# Patient Record
Sex: Female | Born: 1985 | Race: Black or African American | Hispanic: No | Marital: Single | Smoking: Never smoker
Health system: Southern US, Community
[De-identification: ages and names within clinical notes are randomized; demographics above are authoritative.]

## PROBLEM LIST (undated history)

## (undated) HISTORY — PX: DILATION AND CURETTAGE OF UTERUS: SHX78

---

## 2011-09-13 DIAGNOSIS — N750 Cyst of Bartholin's gland: Secondary | ICD-10-CM | POA: Insufficient documentation

## 2011-09-13 DIAGNOSIS — N751 Abscess of Bartholin's gland: Secondary | ICD-10-CM

## 2011-09-13 HISTORY — DX: Abscess of Bartholin's gland: N75.1

## 2011-09-13 HISTORY — DX: Cyst of Bartholin's gland: N75.0

## 2019-02-03 ENCOUNTER — Encounter: Payer: Self-pay | Admitting: Obstetrics & Gynecology

## 2019-02-03 ENCOUNTER — Ambulatory Visit (INDEPENDENT_AMBULATORY_CARE_PROVIDER_SITE_OTHER): Payer: BLUE CROSS/BLUE SHIELD | Admitting: Obstetrics & Gynecology

## 2019-02-03 ENCOUNTER — Other Ambulatory Visit: Payer: Self-pay

## 2019-02-03 DIAGNOSIS — N751 Abscess of Bartholin's gland: Secondary | ICD-10-CM | POA: Diagnosis not present

## 2019-02-03 NOTE — Progress Notes (Signed)
   TELEHEALTH VIRTUAL GYNECOLOGY VISIT ENCOUNTER NOTE  I connected with [name] on 02/03/19 at  2:15 PM EDT by telephone at home and verified that I am speaking with the correct person using two identifiers.   I discussed the limitations, risks, security and privacy concerns of performing an evaluation and management service by telephone and the availability of in person appointments. I also discussed with the patient that there may be a patient responsible charge related to this service. The patient expressed understanding and agreed to proceed.   History:  Sally Wiley is a 33 y.o.  female being evaluated today for Bartholin's gland cyst. Sx has been present for 2-3 days. The pain is mild.   She does report a h/o having a Barthloins gland abscess prev that was drained. She reports that the pain is nowhere near as bad as what she had prev. She denies any abnormal vaginal discharge, bleeding, pelvic pain or other concerns.    The following portions of the patient's history were reviewed and updated as appropriate: allergies, current medications, past family history, past medical history, past social history, past surgical history and problem list.    Review of Systems:  Pertinent items noted in HPI and remainder of comprehensive ROS otherwise negative.  Physical Exam:   General:  Alert, oriented and cooperative.   Mental Status: Normal mood and affect perceived. Normal judgment and thought content.  Physical exam deferred due to nature of the encounter   Assessment and Plan:     Barthloins gland abscess- not draining.   rec warm compress to affected area qid until draining.   Pt to call or go to the hosp if that pain becomes unbearable or if she develops fever or chills.      I discussed the assessment and treatment plan with the patient. The patient was provided an opportunity to ask questions and all were answered. The patient agreed with the plan and demonstrated an  understanding of the instructions.   The patient was advised to call back or seek an in-person evaluation/go to the ED if the symptoms worsen or if the condition fails to improve as anticipated.  I provided 10 minutes of non-face-to-face time during this encounter.   Willodean Rosenthal, MD [facility] [employer], [facility]

## 2019-02-03 NOTE — Progress Notes (Signed)
Patient states she currently has a Bartholins glad cysts- "size of walnut" Patient also states she has cyst on her ovary. Armandina Stammer RN

## 2019-04-27 ENCOUNTER — Other Ambulatory Visit: Payer: Self-pay

## 2019-04-27 ENCOUNTER — Ambulatory Visit (INDEPENDENT_AMBULATORY_CARE_PROVIDER_SITE_OTHER): Payer: BLUE CROSS/BLUE SHIELD | Admitting: Advanced Practice Midwife

## 2019-04-27 ENCOUNTER — Encounter: Payer: Self-pay | Admitting: Advanced Practice Midwife

## 2019-04-27 DIAGNOSIS — Z113 Encounter for screening for infections with a predominantly sexual mode of transmission: Secondary | ICD-10-CM

## 2019-04-27 DIAGNOSIS — Z3402 Encounter for supervision of normal first pregnancy, second trimester: Secondary | ICD-10-CM

## 2019-04-27 DIAGNOSIS — Z124 Encounter for screening for malignant neoplasm of cervix: Secondary | ICD-10-CM | POA: Diagnosis not present

## 2019-04-27 DIAGNOSIS — Z3400 Encounter for supervision of normal first pregnancy, unspecified trimester: Secondary | ICD-10-CM

## 2019-04-27 DIAGNOSIS — Z1151 Encounter for screening for human papillomavirus (HPV): Secondary | ICD-10-CM

## 2019-04-27 DIAGNOSIS — Z3A14 14 weeks gestation of pregnancy: Secondary | ICD-10-CM | POA: Diagnosis not present

## 2019-04-27 LAB — POCT URINALYSIS DIPSTICK OB
Glucose, UA: NEGATIVE
Leukocytes, UA: NEGATIVE
Nitrite, UA: NEGATIVE
Spec Grav, UA: 1.015 (ref 1.010–1.025)
pH, UA: 5 (ref 5.0–8.0)

## 2019-04-27 MED ORDER — AMBULATORY NON FORMULARY MEDICATION: 1.0000 | 0 refills | Status: DC

## 2019-04-27 NOTE — Progress Notes (Signed)
  Subjective:    Sally Wiley is a G1P0 [redacted]w[redacted]d being seen today for her first obstetrical visit.  Her obstetrical history is significant for primigravida. Patient does intend to breast feed. Pregnancy history fully reviewed.  Patient reports no complaints.  Vitals:   04/27/19 0951 04/27/19 0953  BP: (!) 107/58   Pulse: 89   Weight: 49.5 kg   Height:  5\' 2"  (1.575 m)    HISTORY: OB History  Gravida Para Term Preterm AB Living  1            SAB TAB Ectopic Multiple Live Births               # Outcome Date GA Lbr Len/2nd Weight Sex Delivery Anes PTL Lv  1 Current            No past medical history on file. History reviewed. No pertinent surgical history. Family History  Problem Relation Age of Onset  . Cancer Paternal Grandmother   . Heart disease Mother   . Diabetes Neg Hx   . Hypertension Neg Hx      Exam    Uterus:     Pelvic Exam:    Perineum: No Hemorrhoids, Normal Perineum   Vulva: Bartholin's, Urethra, Skene's normal   Vagina:  normal mucosa, normal discharge   pH:    Cervix: no bleeding following Pap and no cervical motion tenderness   Adnexa: normal adnexa and no mass, fullness, tenderness   Bony Pelvis: gynecoid  System: Breast:  normal appearance, no masses or tenderness   Skin: normal coloration and turgor, no rashes    Neurologic: oriented, grossly non-focal   Extremities: normal strength, tone, and muscle mass   HEENT neck supple with midline trachea   Mouth/Teeth mucous membranes moist, pharynx normal without lesions   Neck supple and no masses   Cardiovascular: regular rate and rhythm, no murmurs or gallops   Respiratory:  appears well, vitals normal, no respiratory distress, acyanotic, normal RR, ear and throat exam is normal, neck free of mass or lymphadenopathy, chest clear, no wheezing, crepitations, rhonchi, normal symmetric air entry   Abdomen: soft, non-tender; bowel sounds normal; no masses,  no organomegaly   Urinary: urethral meatus  normal      Assessment:    Pregnancy: G1P0 Patient Active Problem List   Diagnosis Date Noted  . Supervision of normal first pregnancy, antepartum 04/27/2019        Plan:     Initial labs drawn. Prenatal vitamins. Problem list reviewed and updated. Genetic Screening discussed cell free DNA: requested.  Ultrasound discussed; fetal survey: requested.  Follow up in 4 weeks. 75% of 30 min visit spent on counseling and coordination of care.   Welcomed to practice Routines reviewed Practice reviewed including multiple providers, how we staff hospital, learners and how to use MAU.  New OB labs ordered AFP only next visit [place] at 20 weeks for anatomy   Hansel Feinstein 04/27/2019

## 2019-04-27 NOTE — Patient Instructions (Signed)

## 2019-04-27 NOTE — Progress Notes (Signed)
DATING AND VIABILITY SONOGRAM   Sally Wiley is a 33 y.o. year old G1P0 with LMP Patient's last menstrual period was 01/16/2019 (approximate). which would correlate to  [redacted]w[redacted]d weeks gestation.  She has regular menstrual cycles.   She is here today for a confirmatory initial sonogram.    GESTATION: SINGLETON     FETAL ACTIVITY:          Heart rate         148 bpm          The fetus is active.     GESTATIONAL AGE AND  BIOMETRICS:  Gestational criteria: Estimated Date of Delivery: 10/23/19 by LMP now at [redacted]w[redacted]d  Previous Scans:0  Head circ 10.4 cm 14w 6 d                                                                                   AVERAGE EGA(BY THIS SCAN):  14-6 weeks  WORKING EDD( LMP ): 10/23/2019   TECHNICIAN COMMENTS:   Kathrene Alu 04/27/2019 10:30 AM

## 2019-04-29 LAB — CYTOLOGY - PAP
Chlamydia: NEGATIVE
Diagnosis: NEGATIVE
HPV: NOT DETECTED
Neisseria Gonorrhea: NEGATIVE

## 2019-04-29 LAB — OBSTETRIC PANEL, INCLUDING HIV
Basophils Absolute: 0.1 10*3/uL (ref 0.0–0.2)
Basos: 0 %
EOS (ABSOLUTE): 0.1 10*3/uL (ref 0.0–0.4)
Eos: 1 %
HIV Screen 4th Generation wRfx: NONREACTIVE
Hematocrit: 34.6 % (ref 34.0–46.6)
Hemoglobin: 12.1 g/dL (ref 11.1–15.9)
Hepatitis B Surface Ag: NEGATIVE
Immature Grans (Abs): 0.2 10*3/uL — ABNORMAL HIGH (ref 0.0–0.1)
Immature Granulocytes: 2 %
Lymphocytes Absolute: 1.4 10*3/uL (ref 0.7–3.1)
Lymphs: 11 %
MCH: 31.8 pg (ref 26.6–33.0)
MCHC: 35 g/dL (ref 31.5–35.7)
MCV: 91 fL (ref 79–97)
Monocytes Absolute: 0.9 10*3/uL (ref 0.1–0.9)
Monocytes: 7 %
Neutrophils Absolute: 10.1 10*3/uL — ABNORMAL HIGH (ref 1.4–7.0)
Neutrophils: 79 %
Platelets: 359 10*3/uL (ref 150–450)
RBC: 3.81 x10E6/uL (ref 3.77–5.28)
RDW: 12.8 % (ref 11.7–15.4)
RPR Ser Ql: NONREACTIVE
Rh Factor: POSITIVE
Rubella Antibodies, IGG: 12.2 index (ref >0.99)
WBC: 12.8 10*3/uL — ABNORMAL HIGH (ref 3.4–10.8)

## 2019-04-29 LAB — HEMOGLOBINOPATHY EVALUATION
Ferritin: 127 ng/mL (ref 15–150)
Hgb A2 Quant: 2.4 % (ref 1.8–3.2)
Hgb A: 97.6 % (ref 96.4–98.8)
Hgb C: 0 %
Hgb F Quant: 0 % (ref 0.0–2.0)
Hgb S: 0 %
Hgb Solubility: NEGATIVE
Hgb Variant: 0 %

## 2019-04-29 LAB — CULTURE, OB URINE

## 2019-04-29 LAB — AB SCR+ANTIBODY ID

## 2019-04-29 LAB — URINE CULTURE, OB REFLEX: Organism ID, Bacteria: NO GROWTH

## 2019-05-03 LAB — VITAMIN D 1,25 DIHYDROXY
Vitamin D 1, 25 (OH)2 Total: 100 pg/mL — ABNORMAL HIGH
Vitamin D2 1, 25 (OH)2: <10 pg/mL
Vitamin D3 1, 25 (OH)2: 100 pg/mL

## 2019-05-04 ENCOUNTER — Other Ambulatory Visit: Payer: Self-pay | Admitting: Advanced Practice Midwife

## 2019-05-04 DIAGNOSIS — A599 Trichomoniasis, unspecified: Secondary | ICD-10-CM | POA: Insufficient documentation

## 2019-05-04 MED ORDER — METRONIDAZOLE 500 MG PO TABS: ORAL_TABLET | ORAL | 1 refills | Status: DC

## 2019-05-04 NOTE — Progress Notes (Signed)
Noted Trichomonas on Pap result Tried to call pt but no answer  Rx Flagyl 2gm sent to pharmacy Message to office nurses to call her

## 2019-05-11 ENCOUNTER — Telehealth: Payer: Self-pay

## 2019-05-11 NOTE — Telephone Encounter (Signed)
Patient called and made aware of trich infection on Pap smear. Explained this is an STD infection and he partner should be tested. Patient needs to pick up prescription and take it as soon as possible.   Patient also made aware of low risk panorama results and horizions test. Patient made aware that fetal sex is a baby boy.   Patient confirmed next appointment. Kathrene Alu RN

## 2019-05-27 ENCOUNTER — Other Ambulatory Visit: Payer: Self-pay

## 2019-05-27 ENCOUNTER — Ambulatory Visit (INDEPENDENT_AMBULATORY_CARE_PROVIDER_SITE_OTHER): Payer: BLUE CROSS/BLUE SHIELD | Admitting: Family Medicine

## 2019-05-27 VITALS — BP 100/57 | HR 74 | Wt 116.0 lb

## 2019-05-27 DIAGNOSIS — Z3400 Encounter for supervision of normal first pregnancy, unspecified trimester: Secondary | ICD-10-CM

## 2019-05-27 DIAGNOSIS — Z3402 Encounter for supervision of normal first pregnancy, second trimester: Secondary | ICD-10-CM | POA: Diagnosis not present

## 2019-05-27 DIAGNOSIS — Z3A18 18 weeks gestation of pregnancy: Secondary | ICD-10-CM

## 2019-05-27 NOTE — Progress Notes (Signed)
   PRENATAL VISIT NOTE  Subjective:  Sally Wiley is a 33 y.o. G1P0 at [redacted]w[redacted]d being seen today for ongoing prenatal care.  She is currently monitored for the following issues for this low-risk pregnancy and has Supervision of normal first pregnancy, antepartum; Bartholin gland cyst; Bartholin's gland abscess; and Trichomonal infection on their problem list.  Patient reports no complaints.  Contractions: Not present. Vag. Bleeding: None.  Movement: Present. Denies leaking of fluid.   The following portions of the patient's history were reviewed and updated as appropriate: allergies, current medications, past family history, past medical history, past social history, past surgical history and problem list.   Objective:   Vitals:   05/27/19 1028  BP: (!) 100/57  Pulse: 74  Weight: 116 lb (52.6 kg)    Fetal Status: Fetal Heart Rate (bpm): 156   Movement: Present     General:  Alert, oriented and cooperative. Patient is in no acute distress.  Skin: Skin is warm and dry. No rash noted.   Cardiovascular: Normal heart rate noted  Respiratory: Normal respiratory effort, no problems with respiration noted  Abdomen: Soft, gravid, appropriate for gestational age.  Pain/Pressure: Absent     Pelvic: Cervical exam deferred        Extremities: Normal range of motion.  Edema: None  Mental Status: Normal mood and affect. Normal behavior. Normal judgment and thought content.   Assessment and Plan:  Pregnancy: G1P0 at [redacted]w[redacted]d 1. Supervision of normal first pregnancy, antepartum FHT and FH normal - AFP, Serum, Open Spina Bifida  Preterm labor symptoms and general obstetric precautions including but not limited to vaginal bleeding, contractions, leaking of fluid and fetal movement were reviewed in detail with the patient. Please refer to After Visit Summary for other counseling recommendations.   Return in about 4 weeks (around 06/24/2019) for OB f/u, Virtual.  Future Appointments  Date Time  Provider [place]  06/01/2019 10:30 AM [facility] [place] 1 [facility]-MFCUS MFC-US  06/28/2019  9:15 AM Lavonia Drafts, MD [facility] None    Truett Mainland, DO

## 2019-05-29 LAB — AFP, SERUM, OPEN SPINA BIFIDA
AFP MoM: 0.68
AFP Value: 39.8 ng/mL
Gest. Age on Collection Date: 18.5 weeks
Maternal Age At EDD: 31 yr
OSBR Risk 1 IN: 10000
Test Results:: NEGATIVE
Weight: 119 [lb_av]

## 2019-06-01 ENCOUNTER — Ambulatory Visit (HOSPITAL_COMMUNITY)
Admission: RE | Admit: 2019-06-01 | Discharge: 2019-06-01 | Disposition: A | Discharge status: Home / Self Care | Payer: BLUE CROSS/BLUE SHIELD | Source: Ambulatory Visit | Attending: Obstetrics and Gynecology | Admitting: Obstetrics and Gynecology

## 2019-06-01 ENCOUNTER — Other Ambulatory Visit: Payer: Self-pay

## 2019-06-01 ENCOUNTER — Other Ambulatory Visit (HOSPITAL_COMMUNITY): Payer: Self-pay | Admitting: *Deleted

## 2019-06-01 DIAGNOSIS — Z3A19 19 weeks gestation of pregnancy: Secondary | ICD-10-CM

## 2019-06-01 DIAGNOSIS — Z362 Encounter for other antenatal screening follow-up: Secondary | ICD-10-CM

## 2019-06-01 DIAGNOSIS — Z363 Encounter for antenatal screening for malformations: Secondary | ICD-10-CM

## 2019-06-01 DIAGNOSIS — Z3400 Encounter for supervision of normal first pregnancy, unspecified trimester: Secondary | ICD-10-CM

## 2019-06-23 ENCOUNTER — Telehealth: Payer: Self-pay

## 2019-06-23 NOTE — Telephone Encounter (Signed)
Received call from [facility] center care nurse that patient delivered last night at 22.3 weeks - IUFD. Patient was seen at [facility] due to [facility], but then transferred to [facility].Patient labor complicated by footling breech and entrapment (cervical cut performed to deliver baby). Patient had PP hemmorrahge and taken to OR for cervical repair.   Patient scheduled to come back to office on 07-01-2019 for follow up. Patient scheduled for discharge tomorrow 06-24-2019. Kathrene Alu RN

## 2019-06-28 ENCOUNTER — Encounter: Payer: BLUE CROSS/BLUE SHIELD | Admitting: Obstetrics & Gynecology

## 2019-06-30 ENCOUNTER — Encounter (HOSPITAL_COMMUNITY): Payer: Self-pay

## 2019-06-30 ENCOUNTER — Ambulatory Visit (HOSPITAL_COMMUNITY): Payer: BLUE CROSS/BLUE SHIELD

## 2019-07-01 ENCOUNTER — Other Ambulatory Visit: Payer: Self-pay

## 2019-07-01 ENCOUNTER — Encounter: Payer: Self-pay | Admitting: Family Medicine

## 2019-07-01 ENCOUNTER — Ambulatory Visit (INDEPENDENT_AMBULATORY_CARE_PROVIDER_SITE_OTHER): Payer: BLUE CROSS/BLUE SHIELD | Admitting: Family Medicine

## 2019-07-01 DIAGNOSIS — N883 Incompetence of cervix uteri: Secondary | ICD-10-CM

## 2019-07-01 MED ORDER — NORETHINDRONE 0.35 MG PO TABS: 1.0000 | ORAL_TABLET | Freq: Every day | ORAL | 11 refills | Status: DC

## 2019-07-01 NOTE — Progress Notes (Signed)
Subjective:     Sally Wiley is a 33 y.o. female who presents for a postpartum visit. She is 1 week postpartum following a spontaneous vaginal delivery. I have fully reviewed the prenatal and intrapartum course. The delivery was at 22  gestational weeks. Delivery at [facility]. Had incompetent cervix with bulging membranes at home, went to [facility] and got transferred to [facility]. Had PPROM, with subsequent labor and delivery. Baby was breech and had head entrapment, leading to Duhrssen's Incision, which was repaired in OR. Baby did not survive the delivery. Outcome: spontaneous vaginal delivery. Anesthesia: none. Postpartum course has been normal. Bleeding no bleeding. Bowel function is normal. Bladder function is normal. Patient is not sexually active. Contraception method is none. Postpartum depression screening: negative.The following portions of the patient's history were reviewed and updated as appropriate: allergies, current medications, past family history, past medical history, past social history, past surgical history and problem list.    Review of Systems Pertinent items are noted in HPI.   Objective:    Wt 118 lb 1.9 oz (53.6 kg)   LMP 01/16/2019   BMI 21.60 kg/m   General:  alert, cooperative and no distress  Lungs: clear to auscultation bilaterally  Heart:  regular rate and rhythm, S1, S2 normal, no murmur, click, rub or gallop  Abdomen: soft, non-tender; bowel sounds normal; no masses,  no organomegaly   Vulva:  normal  Vagina: normal vagina, no discharge, exudate, lesion, or erythema  Cervix:  multiparous appearance and healing incision with sutures at 12 o'clock        Assessment:     Normal postpartum exam.    Plan:    1. Contraception: oral progesterone-only contraceptive 2. Discussed cerclage in subsequent pregnancies for incompetent cervix. 3. Follow up in: 4 weeks for exam of cervix or as needed.

## 2019-07-29 ENCOUNTER — Ambulatory Visit: Payer: BLUE CROSS/BLUE SHIELD | Admitting: Family Medicine

## 2019-08-12 ENCOUNTER — Ambulatory Visit (INDEPENDENT_AMBULATORY_CARE_PROVIDER_SITE_OTHER): Payer: BLUE CROSS/BLUE SHIELD | Admitting: Family Medicine

## 2019-08-12 ENCOUNTER — Other Ambulatory Visit: Payer: Self-pay

## 2019-08-12 VITALS — BP 97/68 | HR 57 | Wt 118.0 lb

## 2019-08-12 DIAGNOSIS — S3763XS Laceration of uterus, sequela: Secondary | ICD-10-CM

## 2019-08-12 DIAGNOSIS — N883 Incompetence of cervix uteri: Secondary | ICD-10-CM | POA: Insufficient documentation

## 2019-08-12 DIAGNOSIS — Z9889 Other specified postprocedural states: Secondary | ICD-10-CM

## 2019-08-12 DIAGNOSIS — O09299 Supervision of pregnancy with other poor reproductive or obstetric history, unspecified trimester: Secondary | ICD-10-CM | POA: Insufficient documentation

## 2019-08-12 NOTE — Progress Notes (Signed)
   Subjective:    Patient ID: Sally Wiley, female    DOB: 01/01/1986, 33 y.o.   MRN: [id number]  HPI  Patient seen for follow up of cervical laceration that occurred during delivery (had incompetence cervix with delivery at [facility] - noviable 22 weeks breech fetus, Duhrssen's Incision done). She delivered over 6 weeks ago. She reports no problems. Is on micronor and reports no problems. PPD score 0.  Review of Systems     Objective:   Physical Exam Exam conducted with a chaperone present.  Constitutional:      Appearance: Normal appearance.  Cardiovascular:     Rate and Rhythm: Normal rate and regular rhythm.  Pulmonary:     Effort: Pulmonary effort is normal.  Abdominal:     Hernia: There is no hernia in the left inguinal area or right inguinal area.  Genitourinary:    Labia:        Right: No rash, tenderness, lesion or injury.        Left: No rash, tenderness, lesion or injury.      Vagina: Normal.     Cervix: No cervical motion tenderness, discharge, friability, lesion, erythema, cervical bleeding or eversion.     Lymphadenopathy:     Lower Body: No right inguinal adenopathy. No left inguinal adenopathy.  Skin:    Capillary Refill: Capillary refill takes less than 2 seconds.  Neurological:     General: No focal deficit present.     Mental Status: She is alert.         Assessment & Plan:  1. Cervical incompetence Discussed prophylactic cerclage at 12-15 weeks. Will need cervical lengths as well.  2. Cervical laceration, sequela Well healed.

## 2019-08-12 NOTE — Progress Notes (Signed)
EPDS- score 0 today. Pt states she is doing well since last visit. Taking her [facility] and is doing well with that. Pt states no urinary/bowel concerns.  Pt is not sexually active.  Pt has no other concerns today.

## 2019-10-29 NOTE — L&D Delivery Note (Addendum)
OB/GYN Faculty Practice Delivery Note  Sally Wiley is a 34 y.o. G2P0100 s/p NSVD at [redacted]w[redacted]d. She was admitted for PPROM on 04/05/20, then transferred up to L&D for PTL on 04/17/20.   ROM: 288h 53m with clear fluid GBS Status: positive, received one dose of PCN prior to delivery Maximum Maternal Temperature: 98.7*F  Labor Progress:  Admitted with PPROM in the context of having a cerclage in place 2/2 cervical incompetence with history of delivery and demise at 22 weeks. She received magnesium, BMZ x2 on 6/9 and 6/10, as well as latency antibiotics. She reported worsening contractions, and was transferred up to L&D on 6/21. She received an epidural, and her cerclage was removed by Dr. Alysia Penna. She was subsequently found to be 9.5 cm dilated with a forebag, which was AROMed. She progressed to complete and delivered after a short second stage.  Delivery Date/Time: 04/17/20, 0410 Delivery: Head delivered ROA. No nuchal cord present. Shoulder and body delivered in usual fashion. Infant with spontaneous cry, placed on mother's abdomen, dried and stimulated. Cord clamped x 2 after 1-minute delay, and cut by grandmother of the baby under my direct supervision. Then the infant was handed off to the awaiting neonatology team.  Cord blood drawn. Placenta delivered spontaneously with gentle cord traction and fundal massage. Fundus firm with massage and Pitocin. Labia, perineum, vagina, and cervix were inspected, and was found to be intact with some minor periurethral abrasions that were hemostatic and did not require repair.   Placenta: 3 vessel cord, intact, to pathology. Complications: PPROM, PTL, cerclage Lacerations: minor periurethral abrasions, hemostatic and not requiring repair EBL: 263 mL Analgesia: epidural  Postpartum Planning [x]  message to sent to schedule follow-up  [x]  vaccines UTD  Infant: viable female   APGARs 7, 9   1610 g  Dr. was present for delivery.  ,  DO OB/GYN Fellow, Faculty Practice

## 2019-11-25 ENCOUNTER — Ambulatory Visit (INDEPENDENT_AMBULATORY_CARE_PROVIDER_SITE_OTHER): Payer: BLUE CROSS/BLUE SHIELD | Admitting: Family Medicine

## 2019-11-25 ENCOUNTER — Encounter: Payer: Self-pay | Admitting: Family Medicine

## 2019-11-25 ENCOUNTER — Other Ambulatory Visit: Payer: Self-pay

## 2019-11-25 VITALS — BP 114/67 | HR 95 | Wt 115.0 lb

## 2019-11-25 DIAGNOSIS — N883 Incompetence of cervix uteri: Secondary | ICD-10-CM

## 2019-11-25 DIAGNOSIS — S3763XS Laceration of uterus, sequela: Secondary | ICD-10-CM

## 2019-11-25 DIAGNOSIS — O09891 Supervision of other high risk pregnancies, first trimester: Secondary | ICD-10-CM

## 2019-11-25 DIAGNOSIS — Z3480 Encounter for supervision of other normal pregnancy, unspecified trimester: Secondary | ICD-10-CM

## 2019-11-25 DIAGNOSIS — S3763XA Laceration of uterus, initial encounter: Secondary | ICD-10-CM | POA: Insufficient documentation

## 2019-11-25 DIAGNOSIS — Z113 Encounter for screening for infections with a predominantly sexual mode of transmission: Secondary | ICD-10-CM

## 2019-11-25 DIAGNOSIS — Z3A10 10 weeks gestation of pregnancy: Secondary | ICD-10-CM

## 2019-11-25 DIAGNOSIS — O09899 Supervision of other high risk pregnancies, unspecified trimester: Secondary | ICD-10-CM | POA: Insufficient documentation

## 2019-11-25 HISTORY — DX: Encounter for supervision of other normal pregnancy, unspecified trimester: Z34.80

## 2019-11-25 NOTE — Progress Notes (Signed)
DATING AND VIABILITY SONOGRAM   Sally Wiley is a 34 y.o. year old G12P0100 with LMP Patient's last menstrual period was 09/24/2019 (exact date). which would correlate to  [redacted]w[redacted]d weeks gestation.  She has regular menstrual cycles.   She is here today for a confirmatory initial sonogram.    GESTATION: SINGLETON     FETAL ACTIVITY:          Heart rate        170 bpm          The fetus is active.    GESTATIONAL AGE AND  BIOMETRICS:  Gestational criteria: Estimated Date of Delivery: 06/17/20 by early ultrasound now at [redacted]w[redacted]d  Previous Scans:0      CROWN RUMP LENGTH           4.02 cm         10.6 weeks  CRL 3.83 cm  10-5 weeks   3.94 cm 10-5 weeks                                                                       AVERAGE EGA(BY THIS SCAN):  10-5 weeks  WORKING EDD( early ultrasound ):  06-17-20     TECHNICIAN COMMENTS:  Patient informed that the ultrasound is considered a limited obstetric ultrasound and is not intended to be a complete ultrasound exam. Patient also informed that the ultrasound is not being completed with the intent of assessing for fetal or placental anomalies or any pelvic abnormalities. Explained that the purpose of today's ultrasound is to assess for fetal heart rate. Patient acknowledges the purpose of the exam and the limitations of the study   Armandina Stammer 11/25/2019 9:17 AM

## 2019-11-25 NOTE — Progress Notes (Signed)
Subjective:  Sally Wiley is a G2P0100 [redacted]w[redacted]d being seen today for her first obstetrical visit.  Her obstetrical history is significant for incompetent cervix with delivery of breech baby at 22 weeks with head entrapment resulting in a Duhrssen's incision. In subsequent visits, ther cervix had appeared well healed. This is a planned and desired pregnancy. FOB is involved. Patient does intend to breast feed. Pregnancy history fully reviewed.  Patient reports no complaints.  BP 114/67   Pulse 95   Wt 115 lb (52.2 kg)   LMP 09/24/2019 (Within Days)   BMI 21.03 kg/m   HISTORY: OB History  Gravida Para Term Preterm AB Living  2 1   1       SAB TAB Ectopic Multiple Live Births               # Outcome Date GA Lbr Len/2nd Weight Sex Delivery Anes PTL Lv  2 Current           1 Preterm 06/22/19 [redacted]w[redacted]d  15.5 oz (0.439 kg) M   Y FD    Obstetric Comments  Had cervical incompetence with first pregnancy, delivered at 22wks, breech with head entrapment with subsequent Duhrssen's Incision and cervical repair. Occurred at [facility].    No past medical history on file.  No past surgical history on file.  Family History  Problem Relation Age of Onset  . Cancer Paternal Grandmother   . Heart disease Mother   . Diabetes Neg Hx   . Hypertension Neg Hx      Exam  BP 114/67   Pulse 95   Wt 115 lb (52.2 kg)   LMP 09/24/2019 (Within Days)   BMI 21.03 kg/m   Chaperone present during exam  CONSTITUTIONAL: Well-developed, well-nourished female in no acute distress.  HENT:  Normocephalic, atraumatic, External right and left ear normal. Oropharynx is clear and moist EYES: Conjunctivae and EOM are normal. Pupils are equal, round, and reactive to light. No scleral icterus.  NECK: Normal range of motion, supple, no masses.  Normal thyroid.  CARDIOVASCULAR: Normal heart rate noted, regular rhythm RESPIRATORY: Clear to auscultation bilaterally. Effort and breath sounds normal, no  problems with respiration noted. BREASTS: Symmetric in size. No masses, skin changes, nipple drainage, or lymphadenopathy. ABDOMEN: Soft, normal bowel sounds, no distention noted.  No tenderness, rebound or guarding.  PELVIC: Normal appearing external genitalia; normal appearing vaginal mucosa. Previous cervical incision visualized - appears well healed. Cervix closed. No abnormal discharge noted. Normal uterine size, no other palpable masses, no uterine or adnexal tenderness. MUSCULOSKELETAL: Normal range of motion. No tenderness.  No cyanosis, clubbing, or edema.  2+ distal pulses. SKIN: Skin is warm and dry. No rash noted. Not diaphoretic. No erythema. No pallor. NEUROLOGIC: Alert and oriented to person, place, and time. Normal reflexes, muscle tone coordination. No cranial nerve deficit noted. PSYCHIATRIC: Normal mood and affect. Normal behavior. Normal judgment and thought content.    Assessment:    Pregnancy: G2P0100 Patient Active Problem List   Diagnosis Date Noted  . Supervision of other normal pregnancy, antepartum 11/25/2019  . Cervical incompetence 08/12/2019  . Trichomonal infection 05/04/2019  . Bartholin gland cyst 09/13/2011  . Bartholin's gland abscess 09/13/2011      Plan:   1. Supervision of other normal pregnancy, antepartum Genetic testing desired. Will obtain.  09/15/2011 at 20 weeks  - [facility] AMB BABYSCRIPTS SCHEDULE OPTIMIZATION - Culture, OB Urine - Obstetric Panel, Including HIV - SMN1 COPY NUMBER ANALYSIS (SMA Carrier Screen) -  GC/Chlamydia probe amp ([place])not at [facility]  2. Cervical incompetence Discussed cerclage placement at 12-15 weeks. Discussed risks, including vaginal irritation, bleeding, ROM, failure.  3. Cervical laceration, sequela Appears well healed.  4. Short interval between pregnancies.    Problem list reviewed and updated. 75% of 30 min visit spent on counseling and coordination of care.     Truett Mainland 11/25/2019

## 2019-11-26 LAB — GC/CHLAMYDIA PROBE AMP (~~LOC~~) NOT AT ARMC
Chlamydia: NEGATIVE
Comment: NEGATIVE
Comment: NORMAL
Neisseria Gonorrhea: NEGATIVE

## 2019-11-27 LAB — CULTURE, OB URINE

## 2019-11-27 LAB — URINE CULTURE, OB REFLEX: Organism ID, Bacteria: NO GROWTH

## 2019-12-06 ENCOUNTER — Encounter (HOSPITAL_COMMUNITY): Payer: Self-pay | Admitting: *Deleted

## 2019-12-06 ENCOUNTER — Telehealth (HOSPITAL_COMMUNITY): Payer: Self-pay | Admitting: *Deleted

## 2019-12-06 NOTE — Telephone Encounter (Signed)
Preadmission screen  

## 2019-12-07 LAB — AB SCR+ANTIBODY ID: Antibody Screen: POSITIVE — AB

## 2019-12-07 LAB — SMN1 COPY NUMBER ANALYSIS (SMA CARRIER SCREENING)

## 2019-12-07 LAB — OBSTETRIC PANEL, INCLUDING HIV
Basophils Absolute: 0.1 10*3/uL (ref 0.0–0.2)
Basos: 0 %
EOS (ABSOLUTE): 0 10*3/uL (ref 0.0–0.4)
Eos: 0 %
HIV Screen 4th Generation wRfx: NONREACTIVE
Hematocrit: 41 % (ref 34.0–46.6)
Hemoglobin: 13.4 g/dL (ref 11.1–15.9)
Hepatitis B Surface Ag: NEGATIVE
Immature Grans (Abs): 0.1 10*3/uL (ref 0.0–0.1)
Immature Granulocytes: 1 %
Lymphocytes Absolute: 1.5 10*3/uL (ref 0.7–3.1)
Lymphs: 13 %
MCH: 29.3 pg (ref 26.6–33.0)
MCHC: 32.7 g/dL (ref 31.5–35.7)
MCV: 90 fL (ref 79–97)
Monocytes Absolute: 0.8 10*3/uL (ref 0.1–0.9)
Monocytes: 7 %
Neutrophils Absolute: 9 10*3/uL — ABNORMAL HIGH (ref 1.4–7.0)
Neutrophils: 79 %
Platelets: 438 10*3/uL (ref 150–450)
RBC: 4.58 x10E6/uL (ref 3.77–5.28)
RDW: 15 % (ref 11.7–15.4)
RPR Ser Ql: NONREACTIVE
Rh Factor: POSITIVE
Rubella Antibodies, IGG: 12.7 index (ref >0.99)
WBC: 11.5 10*3/uL — ABNORMAL HIGH (ref 3.4–10.8)

## 2019-12-08 ENCOUNTER — Telehealth: Payer: Self-pay

## 2019-12-08 ENCOUNTER — Encounter: Payer: BLUE CROSS/BLUE SHIELD | Admitting: Obstetrics & Gynecology

## 2019-12-08 NOTE — Telephone Encounter (Signed)
Called pt. Pt made aware that her word catheter can be removed during her cerclage placement.  Understanding was voiced. Liadan Guizar l Uriah Trueba, CMA

## 2019-12-14 ENCOUNTER — Other Ambulatory Visit (HOSPITAL_COMMUNITY)
Admission: RE | Admit: 2019-12-14 | Discharge: 2019-12-14 | Disposition: A | Discharge status: Home / Self Care | Payer: BLUE CROSS/BLUE SHIELD | Source: Ambulatory Visit | Attending: Obstetrics and Gynecology | Admitting: Obstetrics and Gynecology

## 2019-12-14 DIAGNOSIS — Z20822 Contact with and (suspected) exposure to covid-19: Secondary | ICD-10-CM | POA: Insufficient documentation

## 2019-12-14 DIAGNOSIS — Z01812 Encounter for preprocedural laboratory examination: Secondary | ICD-10-CM | POA: Diagnosis present

## 2019-12-14 LAB — SARS CORONAVIRUS 2 (TAT 6-24 HRS): SARS Coronavirus 2: NEGATIVE

## 2019-12-16 ENCOUNTER — Other Ambulatory Visit: Payer: Self-pay

## 2019-12-16 ENCOUNTER — Other Ambulatory Visit: Payer: Self-pay | Admitting: Obstetrics and Gynecology

## 2019-12-16 ENCOUNTER — Observation Stay (HOSPITAL_COMMUNITY)
Admission: AD | Admit: 2019-12-16 | Discharge: 2019-12-16 | Disposition: A | Discharge status: Home / Self Care | Payer: BLUE CROSS/BLUE SHIELD | Source: Ambulatory Visit | Attending: Obstetrics and Gynecology | Admitting: Obstetrics and Gynecology

## 2019-12-16 ENCOUNTER — Inpatient Hospital Stay (HOSPITAL_COMMUNITY): Payer: BLUE CROSS/BLUE SHIELD | Admitting: Anesthesiology

## 2019-12-16 ENCOUNTER — Encounter (HOSPITAL_COMMUNITY)
Admission: AD | Disposition: A | Discharge status: Home / Self Care | Payer: Self-pay | Source: Ambulatory Visit | Attending: Obstetrics & Gynecology

## 2019-12-16 ENCOUNTER — Ambulatory Visit (HOSPITAL_COMMUNITY)
Admission: RE | Admit: 2019-12-16 | Payer: BLUE CROSS/BLUE SHIELD | Source: Home / Self Care | Admitting: Obstetrics and Gynecology

## 2019-12-16 ENCOUNTER — Encounter (HOSPITAL_COMMUNITY): Payer: Self-pay | Admitting: Obstetrics & Gynecology

## 2019-12-16 DIAGNOSIS — Q519 Congenital malformation of uterus and cervix, unspecified: Secondary | ICD-10-CM

## 2019-12-16 DIAGNOSIS — Z8751 Personal history of pre-term labor: Secondary | ICD-10-CM | POA: Insufficient documentation

## 2019-12-16 DIAGNOSIS — Z3A13 13 weeks gestation of pregnancy: Secondary | ICD-10-CM | POA: Insufficient documentation

## 2019-12-16 DIAGNOSIS — O3431 Maternal care for cervical incompetence, first trimester: Principal | ICD-10-CM | POA: Insufficient documentation

## 2019-12-16 DIAGNOSIS — N883 Incompetence of cervix uteri: Secondary | ICD-10-CM

## 2019-12-16 DIAGNOSIS — O3430 Maternal care for cervical incompetence, unspecified trimester: Secondary | ICD-10-CM | POA: Diagnosis not present

## 2019-12-16 HISTORY — PX: CERVICAL CERCLAGE: SHX1329

## 2019-12-16 LAB — CBC
HCT: 35.6 % — ABNORMAL LOW (ref 36.0–46.0)
Hemoglobin: 12 g/dL (ref 12.0–15.0)
MCH: 31.3 pg (ref 26.0–34.0)
MCHC: 33.7 g/dL (ref 30.0–36.0)
MCV: 92.7 fL (ref 80.0–100.0)
Platelets: 388 10*3/uL (ref 150–400)
RBC: 3.84 MIL/uL — ABNORMAL LOW (ref 3.87–5.11)
RDW: 13.7 % (ref 11.5–15.5)
WBC: 12 10*3/uL — ABNORMAL HIGH (ref 4.0–10.5)
nRBC: 0 % (ref 0.0–0.2)

## 2019-12-16 SURGERY — CERCLAGE, CERVIX, VAGINAL APPROACH
Anesthesia: Spinal

## 2019-12-16 MED ORDER — ACETAMINOPHEN 10 MG/ML IV SOLN
INTRAVENOUS | Status: AC
  Filled 2019-12-16: qty 100

## 2019-12-16 MED ORDER — METRONIDAZOLE 500 MG PO TABS
1000.0000 mg | ORAL_TABLET | Freq: Once | ORAL | Status: AC
  Administered 2019-12-16: 1000 mg via ORAL
  Filled 2019-12-16: qty 2

## 2019-12-16 MED ORDER — BUPIVACAINE HCL (PF) 0.5 % IJ SOLN
INTRAMUSCULAR | Status: AC
  Filled 2019-12-16: qty 30

## 2019-12-16 MED ORDER — ACETAMINOPHEN 10 MG/ML IV SOLN
1000.0000 mg | Freq: Once | INTRAVENOUS | Status: DC | PRN
  Administered 2019-12-16: 1000 mg via INTRAVENOUS

## 2019-12-16 MED ORDER — LACTATED RINGERS IV SOLN: INTRAVENOUS | Status: DC

## 2019-12-16 MED ORDER — PROMETHAZINE HCL 25 MG/ML IJ SOLN: 6.2500 mg | Freq: Once | INTRAMUSCULAR | Status: DC

## 2019-12-16 MED ORDER — SODIUM CHLORIDE 0.9 % IR SOLN
Status: DC | PRN
  Administered 2019-12-16: 1

## 2019-12-16 MED ORDER — CEFAZOLIN SODIUM-DEXTROSE 2-4 GM/100ML-% IV SOLN
2.0000 g | INTRAVENOUS | Status: AC
  Administered 2019-12-16: 10:00:00 2 g via INTRAVENOUS

## 2019-12-16 MED ORDER — FENTANYL CITRATE (PF) 100 MCG/2ML IJ SOLN: 25.0000 ug | INTRAMUSCULAR | Status: DC | PRN

## 2019-12-16 MED ORDER — IBUPROFEN 600 MG PO TABS
ORAL_TABLET | ORAL | Status: AC
  Filled 2019-12-16: qty 1

## 2019-12-16 MED ORDER — FENTANYL CITRATE (PF) 100 MCG/2ML IJ SOLN
INTRAMUSCULAR | Status: DC | PRN
  Administered 2019-12-16 (×3): 25 ug via INTRAVENOUS

## 2019-12-16 MED ORDER — METRONIDAZOLE IVPB CUSTOM
1000.0000 mg | Freq: Once | INTRAVENOUS | Status: AC
  Administered 2019-12-16: 1000 mg via INTRAVENOUS
  Filled 2019-12-16: qty 200

## 2019-12-16 MED ORDER — IBUPROFEN 600 MG PO TABS
600.0000 mg | ORAL_TABLET | Freq: Four times a day (QID) | ORAL | Status: DC
  Administered 2019-12-16: 600 mg via ORAL

## 2019-12-16 MED ORDER — IBUPROFEN 600 MG PO TABS: 600.0000 mg | ORAL_TABLET | Freq: Four times a day (QID) | ORAL | 0 refills | Status: AC

## 2019-12-16 MED ORDER — BUPIVACAINE IN DEXTROSE 0.75-8.25 % IT SOLN
INTRATHECAL | Status: DC | PRN
  Administered 2019-12-16: 1 mL via INTRATHECAL

## 2019-12-16 MED ORDER — ONDANSETRON HCL 4 MG/2ML IJ SOLN: 4.0000 mg | Freq: Once | INTRAMUSCULAR | Status: DC | PRN

## 2019-12-16 MED ORDER — ONDANSETRON HCL 4 MG/2ML IJ SOLN
INTRAMUSCULAR | Status: AC
  Filled 2019-12-16: qty 2

## 2019-12-16 MED ORDER — ONDANSETRON HCL 4 MG/2ML IJ SOLN: 4.0000 mg | Freq: Once | INTRAMUSCULAR | Status: DC

## 2019-12-16 SURGICAL SUPPLY — 20 items
CANISTER SUCT 3000ML PPV (MISCELLANEOUS) ×3 IMPLANT
DECANTER SPIKE VIAL GLASS SM (MISCELLANEOUS) IMPLANT
GLOVE BIOGEL PI IND STRL 7.0 (GLOVE) ×2 IMPLANT
GLOVE BIOGEL PI INDICATOR 7.0 (GLOVE) ×4
GLOVE SURG SS PI 7.0 STRL IVOR (GLOVE) ×3 IMPLANT
GOWN STRL REUS W/TWL LRG LVL3 (GOWN DISPOSABLE) ×3 IMPLANT
GOWN STRL REUS W/TWL XL LVL3 (GOWN DISPOSABLE) ×3 IMPLANT
NEEDLE HYPO 25X1 1.5 SAFETY (NEEDLE) ×3 IMPLANT
NS IRRIG 1000ML POUR BTL (IV SOLUTION) ×3 IMPLANT
PACK VAGINAL MINOR WOMEN LF (CUSTOM PROCEDURE TRAY) ×3 IMPLANT
PAD OB MATERNITY 4.3X12.25 (PERSONAL CARE ITEMS) ×3 IMPLANT
PAD PREP 24X48 CUFFED NSTRL (MISCELLANEOUS) ×3 IMPLANT
SUT MERSILENE 5MM BP 1 12 (SUTURE) ×3 IMPLANT
SUT MERSILENE FIBER S 5 CTX 12 (SUTURE) ×3 IMPLANT
SUT SILK 2 0 FSL 18 (SUTURE) ×3 IMPLANT
TOWEL OR 17X24 6PK STRL BLUE (TOWEL DISPOSABLE) ×6 IMPLANT
TRAY FOLEY W/BAG SLVR 14FR (SET/KITS/TRAYS/PACK) ×3 IMPLANT
TUBING NON-CON 1/4 X 20 CONN (TUBING) IMPLANT
TUBING NON-CON 1/4 X 20' CONN (TUBING)
YANKAUER SUCT BULB TIP NO VENT (SUCTIONS) IMPLANT

## 2019-12-16 NOTE — Anesthesia Postprocedure Evaluation (Signed)
Anesthesia Post Note  Patient: DELCIA SPITZLEY  Procedure(s) Performed: CERCLAGE CERVICAL (N/A )     Patient location during evaluation: PACU Anesthesia Type: Spinal Level of consciousness: oriented and awake and alert Pain management: pain level controlled Vital Signs Assessment: post-procedure vital signs reviewed and stable Respiratory status: spontaneous breathing, respiratory function stable and patient connected to nasal cannula oxygen Cardiovascular status: blood pressure returned to baseline and stable Postop Assessment: no headache, no backache and no apparent nausea or vomiting Anesthetic complications: no    Last Vitals:  Vitals:   12/16/19 1300 12/16/19 1309  BP: 103/74 105/70  Pulse: 72   Resp: 14   Temp:  36.5 C  SpO2: 100%     Last Pain:  Vitals:   12/16/19 1309  TempSrc: Oral  PainSc: 0-No pain   Pain Goal:                   Flavius Repsher L Annaelle Kasel

## 2019-12-16 NOTE — Progress Notes (Signed)
Patient is on [facility] premises for accommodation purposes only. Discussed with patient the expectations during their stay. Verbal consent was received from patient of the following:   I understand [facility]) is offering accommodations only for me to utilize for safety precautions due to the threat of winter weather.   I understand that this service will only be offered for a limited period of time, until I am admitted for inpatient services. I understand that one family member or personal caregiver can stay with me in order to provide care as determined by the [facility] Administrative Coordinator/L&D Specialty Coordinator.  I agree to comply with hospital infection prevention practices.  I understand that nursing care will not be available to me or my family member, unless there is a medical emergency.  I understand that [facility] will not provide food or supplies to me during my stay. [facility] will only provide linens and towels to me for use.  I understand I am responsible for bringing my medications to the hospital and self-administering them according to my medical regimen.  I understand that if I experience a medical emergency, I will be admitted through the hospital's Emergency Department or Maternity Admissions Unit for treatment.  I understand I can call L&D for any questions.  I understand that [facility] is not responsible for any lost belongings.  I will inform the L&D Specialty Coordinator when I leave the premises of [facility] to return home or to an alternative location.

## 2019-12-16 NOTE — OR Nursing (Signed)
Pt foley removed instructed she has 6 hours to void on her own. If not to call her doctors office Discharge instructions given. Pt ambulated in room. VSS.

## 2019-12-16 NOTE — Transfer of Care (Signed)
Immediate Anesthesia Transfer of Care Note  Patient: Sally Wiley  Procedure(s) Performed: CERCLAGE CERVICAL (N/A )  Patient Location: PACU  Anesthesia Type:Spinal  Level of Consciousness: awake, alert , oriented and patient cooperative  Airway & Oxygen Therapy: Patient Spontanous Breathing  Post-op Assessment: Report given to RN and Post -op Vital signs reviewed and stable  Post vital signs: Reviewed and stable  Last Vitals:  Vitals Value Taken Time  BP 114/71 12/16/19 1018  Temp    Pulse 80 12/16/19 1020  Resp 15 12/16/19 1020  SpO2 100 % 12/16/19 1020  Vitals shown include unvalidated device data.  Last Pain:  Vitals:   12/16/19 0725  TempSrc: Oral         Complications: No apparent anesthesia complications

## 2019-12-16 NOTE — Anesthesia Preprocedure Evaluation (Signed)
Anesthesia Evaluation  Patient identified by MRN, date of birth, ID band Patient awake    Reviewed: Allergy & Precautions, NPO status , Patient's Chart, lab work & pertinent test results  Airway Mallampati: II  TM Distance: >3 FB Neck ROM: Full    Dental no notable dental hx.    Pulmonary neg pulmonary ROS,    Pulmonary exam normal breath sounds clear to auscultation       Cardiovascular negative cardio ROS Normal cardiovascular exam Rhythm:Regular Rate:Normal     Neuro/Psych negative neurological ROS  negative psych ROS   GI/Hepatic negative GI ROS, Neg liver ROS,   Endo/Other  negative endocrine ROS  Renal/GU negative Renal ROS  negative genitourinary   Musculoskeletal negative musculoskeletal ROS (+)   Abdominal   Peds  Hematology negative hematology ROS (+)   Anesthesia Other Findings   Reproductive/Obstetrics (+) Pregnancy                             Anesthesia Physical Anesthesia Plan  ASA: II  Anesthesia Plan: Spinal   Post-op Pain Management:    Induction:   PONV Risk Score and Plan: Treatment may vary due to age or medical condition  Airway Management Planned: Natural Airway  Additional Equipment:   Intra-op Plan:   Post-operative Plan:   Informed Consent: I have reviewed the patients History and Physical, chart, labs and discussed the procedure including the risks, benefits and alternatives for the proposed anesthesia with the patient or authorized representative who has indicated his/her understanding and acceptance.     Dental advisory given  Plan Discussed with: CRNA  Anesthesia Plan Comments:         Anesthesia Quick Evaluation

## 2019-12-16 NOTE — H&P (Signed)
Obstetrics Admission History & Physical  12/16/2019 - 8:28 AM Primary OBGYN: [facility]  Chief Complaint: scheduled cerclage and word catheter removal  History of Present Illness  34 y.o. G2P0100 @ [redacted]w[redacted]d, with the above CC. Pregnancy complicated by: h/o 22wk preterm labor, h/o trich.  [name] states that she has no GU s/s  Review of Systems:  as noted in the History of Present Illness.   PMHx: History reviewed. No pertinent past medical history. PSHx:  Past Surgical History:  Procedure Laterality Date  . DILATION AND CURETTAGE OF UTERUS     Medications:  Medications Prior to Admission  Medication Sig Dispense Refill Last Dose  . acetaminophen (TYLENOL) 500 MG tablet Take 500 mg by mouth every 6 (six) hours as needed for mild pain.   12/15/2019 at Unknown time  . Multiple Vitamin (MULTIVITAMIN WITH MINERALS) TABS tablet Take 1 tablet by mouth daily.   12/15/2019 at Unknown time  . Prenatal Vit-Fe Fumarate-FA (PRENATAL MULTIVITAMIN) TABS tablet Take 1 tablet by mouth daily at 12 noon.   12/15/2019 at Unknown time  . AMBULATORY NON FORMULARY MEDICATION 1 Device by Other route once a week. Blood pressure cuff  Monitored weekly at home ICD 10: Z34.90 1 Device 0      Allergies: is allergic to shellfish allergy. OBHx:  OB History  Gravida Para Term Preterm AB Living  2 1   1       SAB TAB Ectopic Multiple Live Births               # Outcome Date GA Lbr Len/2nd Weight Sex Delivery Anes PTL Lv  2 Current           1 Preterm 06/22/19 [redacted]w[redacted]d  439 g M   Y FD    Obstetric Comments  Had cervical incompetence with first pregnancy, delivered at 22wks, breech with head entrapment with subsequent Duhrssen's Incision and cervical repair. Occurred at [facility].      FHx:  Family History  Problem Relation Age of Onset  . Cancer Paternal Grandmother   . Heart disease Mother   . Diabetes Neg Hx   . Hypertension Neg Hx    Soc Hx:   Social History   Socioeconomic History  . Marital status: Single    Spouse name: Not on file  . Number of children: Not on file  . Years of education: Not on file  . Highest education level: Not on file  Occupational History  . Not on file  Tobacco Use  . Smoking status: Never Smoker  . Smokeless tobacco: Never Used  Substance and Sexual Activity  . Alcohol use: Yes    Comment: socailly  . Drug use: Never  . Sexual activity: Yes  Other Topics Concern  . Not on file  Social History Narrative  . Not on file   Social Determinants of Health   Financial Resource Strain:   . Difficulty of Paying Living Expenses: Not on file  Food Insecurity:   . Worried About [facility] in the Last Year: Not on file  . Ran Out of Food in the Last Year: Not on file  Transportation Needs:   . Lack of Transportation (Medical): Not on file  . Lack of Transportation (Non-Medical): Not on file  Physical Activity:   . Days of Exercise per Week: Not on file  . Minutes of Exercise per Session: Not on file  Stress:   . Feeling of  Stress : Not on file  Social Connections:   . Frequency of Communication with Friends and Family: Not on file  . Frequency of Social Gatherings with Friends and Family: Not on file  . Attends Religious Services: Not on file  . Active Member of Clubs or Organizations: Not on file  . Attends Archivist Meetings: Not on file  . Marital Status: Not on file  Intimate Partner Violence:   . Fear of Current or Ex-Partner: Not on file  . Emotionally Abused: Not on file  . Physically Abused: Not on file  . Sexually Abused: Not on file    Objective    Current Vital Signs 24h Vital Sign Ranges  T 97.9 F (36.6 C) Temp  Avg: 97.9 F (36.6 C)  Min: 97.9 F (36.6 C)  Max: 97.9 F (36.6 C)  BP 105/66 BP  Min: 105/66  Max: 105/66  HR 79 Pulse  Avg: 79  Min: 79  Max: 79  RR   No data recorded  SaO2 100 % Room Air SpO2  Avg: 100 %  Min: 100 %  Max: 100 %        24 Hour I/O Current Shift I/O  Time Ins Outs No intake/output data recorded. No intake/output data recorded.   FHT: 160s   General: Well nourished, well developed female in no acute distress.  Skin:  Warm and dry.  Cardiovascular: S1, S2 normal, no murmur, rub or gallop, regular rate and rhythm Respiratory:  Clear to auscultation bilateral. Normal respiratory effort Abdomen: gravid, nttp Neuro/Psych:  Normal mood and affect.   Labs  Recent Labs  Lab 12/16/19 0634  WBC 12.0*  HGB 12.0  HCT 35.6*  PLT 388    Radiology No new imaging   Assessment & Plan   34 y.o. G2P0100 @ [redacted]w[redacted]d here for scheduled ppx cerclage and word catheter removal, pt doing well  D/w her r/b of ppx cerclage and she is amenable to placement. Pt had word placed at [place] about 3 weeks ago, per patient. I d/w her that can remove today if everything looks good  Will tx with flagyl 1gm for h/o trich and no toc in the system, in addition for 2gm ancef for routine surgical ppx  Will do motrin 600 q6h x 48 hours and pelvic rest until cerclage out d/w pt.  Can proceed when or is ready  Durene Romans MD Attending Center for [facility])

## 2019-12-16 NOTE — Anesthesia Procedure Notes (Signed)
Spinal  Patient location during procedure: OR Start time: 12/16/2019 9:20 AM End time: 12/16/2019 9:30 AM Staffing Performed: anesthesiologist  Anesthesiologist: Elmer Picker, MD Preanesthetic Checklist Completed: patient identified, IV checked, risks and benefits discussed, surgical consent, monitors and equipment checked, pre-op evaluation and timeout performed Spinal Block Patient position: sitting Prep: DuraPrep and site prepped and draped Patient monitoring: cardiac monitor, continuous pulse ox and blood pressure Approach: midline Location: L3-4 Injection technique: single-shot Needle Needle type: Pencan  Needle gauge: 24 G Needle length: 9 cm Assessment Sensory level: T6 Additional Notes Functioning IV was confirmed and monitors were applied. Sterile prep and drape, including hand hygiene and sterile gloves were used. The patient was positioned and the spine was prepped. The skin was anesthetized with lidocaine.  Free flow of clear CSF was obtained prior to injecting local anesthetic into the CSF.  The spinal needle aspirated freely following injection.  The needle was carefully withdrawn.  The patient tolerated the procedure well.

## 2019-12-16 NOTE — Addendum Note (Signed)
Addendum  created 12/16/19 1734 by Earmon Phoenix, CRNA   Charge Capture section accepted

## 2019-12-16 NOTE — Progress Notes (Signed)
.  mf

## 2019-12-16 NOTE — Op Note (Addendum)
Operative Note   12/16/2019  PRE-OP DIAGNOSIS: Pregnancy at 13/5. History of PPROM and cervical insufficiency and delivery at 22wks. History of Durhussen's incision at 10 o'clock   POST-OP DIAGNOSIS: Same. Slightly short cervix  SURGEON: Surgeon(s) and Role:    * [place] Bing, MD - Primary  ASSISTANT:    * Fair, Hoyle Sauer, MD - Fellow  PROCEDURE: Transvaginal Prophylactic McDonald Cerclage with Mersilene Tape (air knot at 12 o'clock). Right Sided Word Catheter Removal  ANESTHESIA: Spinal  ESTIMATED BLOOD LOSS: 32mL  DRAINS: indwelling foley. Per anesthesia note   TOTAL IV FLUIDS: per anesthesia note  SPECIMENS: None  VTE PROPHYLAXIS: SCDs to the bilateral lower extremities  ANTIBIOTICS: Two grams of Cefazolin and Flagyl (history of trichomonas with no test of cure in the system)  COMPLICATIONS: None  DISPOSITION: PACU - hemodynamically stable.  CONDITION: stable   FINDINGS: Exam under anesthesia showed normal EGBUS except for right sided Word catheter. This was removed and normal right labia and well healed incision at the right labia majora past the introitus. Normal vagina. Cervix with well healed laceration at 10 o'clock. Cervix was visually about two cm in length, visually closed. It felt about two cm in length. Same findings post procedure.   PROCEDURE IN DETAIL:  After informed consent was obtained, the patient was taken to the operating room where anesthesia was obtained without difficulty. The patient was positioned in the dorsal lithotomy position in [place] stirrups.  The patient's bladder was catheterized with an indwelling foley catheter.  The patient was examined under anesthesia, with the above noted findings and the Word catheter deflated and removed. The patient was placed in Trendelenburg and a weighted speculum and a Sims retractor placed. The anterior and posterior lips of the cervix was grasped and Mersilene Tape used to make a circumferential suture, going  counter clockwise. Four knots were made anteriorly and then an air knot was made above that.  Excellent hemostasis was noted, and all instruments were removed, with excellent hemostasis noted throughout.  She was then taken out of dorsal lithotomy. The patient tolerated the procedure well.  Sponge, lap and instrument counts were correct x2.  The patient was taken to recovery room in excellent condition.  The cervix felt ? Somewhat short pre procedure so I ordered a transvaginal ultrasound for 15-16wks. Also, based on her history, I feel 17-P is reasonable to do as well. The clinic was requested to get this set up for her.   The patient will placed on 48 hours of prophylactic NSAIDS post procedure.   Cornelia Copa MD Attending Center for [facility])

## 2019-12-16 NOTE — Discharge Instructions (Signed)
Cervical Cerclage, Care After This sheet gives you information about how to care for yourself after your procedure. Your health care provider may also give you more specific instructions. If you have problems or questions, contact your health care provider. What can I expect after the procedure? After your procedure, it is common to have:  Cramping in your abdomen.  Mucus discharge from your vagina. This may last for several days.  Painful urination (dysuria).  Spotting, or small drops of blood coming from your vagina. Follow these instructions at home: Nothing in the vagina until the cerclage is removed Medicines  Take over-the-counter and prescription medicines only as told by your health care provider.  Ask your health care provider if the medicine prescribed to you requires you to avoid driving or using heavy machinery. General instructions  If you are told to go on bed rest, follow instructions from your health care provider. You may need to be on bed rest for up to 3 days.  Keep track of your vaginal discharge and watch for any changes. If you notice changes, tell your health care provider.  Avoid physical activities and exercise until your health care provider approves. Ask your health care provider what activities are safe for you.  Do not douche or have sex until your health care provider says it is okay to do so.  Keep all follow-up visits, including prenatal visits, as told by your health care provider. This is important. ? Prenatal visits are all the care that you receive before the birth of your baby. ? You may also need an ultrasound.  You may be asked to have weekly visits to have your cervix checked. Contact a health care provider if you:  Have abnormal discharge from your vagina, such as clots.  Have a bad-smelling discharge from your vagina.  Develop a rash on your skin. This may look like redness and swelling.  Become light-headed or feel like you are  going to faint.  Have abdominal pain that does not get better with medicine.  Have nausea or vomiting that does not go away. Get help right away if you:  Have vaginal bleeding that is heavier or more frequent than spotting.  Are leaking fluid or your water breaks.  Have a fever or chills.  Faint.  Have uterine contractions. These may feel like: ? A back ache. ? Lower abdominal pain. ? Mild cramps, similar to menstrual cramps. ? Tightening or pressure in your abdomen.  Think that your baby is not moving as much as usual, or you cannot feel your baby move.  Have chest pain.  Have shortness of breath. Summary  After the procedure, it is common to have cramping, vaginal discharge, painful urination, and small drops of blood coming from your vagina.  If you are told to go on bed rest, follow instructions from your health care provider. You may need to be on bed rest for up to 3 days.  Keep track of your vaginal discharge and watch for any changes. If you notice changes, tell your health care provider.  Contact a health care provider if you have abnormal vaginal discharge, become light-headed, or have pain that cannot be controlled with medicines.  Get help right away if you have heavy vaginal bleeding, your water breaks, or you have uterine contractions. Also, get help right away if your baby is not moving as much as usual, or you have chest pain or shortness of breath. This information is not intended to replace advice  given to you by your health care provider. Make sure you discuss any questions you have with your health care provider. Document Revised: 08/10/2019 Document Reviewed: 06/09/2019 Elsevier Patient Education  2020 [employer].

## 2019-12-17 NOTE — Discharge Summary (Signed)
Gynecology Discharge Summary Date of Admission: 12/16/2019 Date of Discharge: 12/16/2019  The patient was admitted, as scheduled, and underwent a Transvaginal prophylactic McDonald Cerclage with Mersilene tape at 13/5 weeks.; please refer to operative note for full details.  She was meeting all post op goals and discharged to home from the PACU  Allergies as of 12/16/2019      Reactions   Shellfish Allergy Hives      Medication List    STOP taking these medications   multivitamin with minerals Tabs tablet     TAKE these medications   acetaminophen 500 MG tablet Commonly known as: TYLENOL Take 500 mg by mouth every 6 (six) hours as needed for mild pain.   AMBULATORY NON FORMULARY MEDICATION 1 Device by Other route once a week. Blood pressure cuff  Monitored weekly at home ICD 10: Z34.90   ibuprofen 600 MG tablet Commonly known as: ADVIL Take 1 tablet (600 mg total) by mouth every 6 (six) hours for 2 days.   prenatal multivitamin Tabs tablet Take 1 tablet by mouth daily at 12 noon.       Future Appointments  Date Time Provider Department Center  12/28/2019  9:30 AM Aviva Signs, CNM [facility] None  12/31/2019  9:30 AM [facility] [place] 1 [facility]    Cornelia Copa. MD Attending Center for [facility])

## 2019-12-28 ENCOUNTER — Encounter: Payer: Self-pay | Admitting: Advanced Practice Midwife

## 2019-12-28 ENCOUNTER — Ambulatory Visit (INDEPENDENT_AMBULATORY_CARE_PROVIDER_SITE_OTHER): Payer: BLUE CROSS/BLUE SHIELD | Admitting: Advanced Practice Midwife

## 2019-12-28 ENCOUNTER — Other Ambulatory Visit: Payer: Self-pay

## 2019-12-28 VITALS — BP 109/69 | HR 83 | Wt 118.0 lb

## 2019-12-28 DIAGNOSIS — O3432 Maternal care for cervical incompetence, second trimester: Secondary | ICD-10-CM

## 2019-12-28 DIAGNOSIS — O09899 Supervision of other high risk pregnancies, unspecified trimester: Secondary | ICD-10-CM

## 2019-12-28 DIAGNOSIS — Z3480 Encounter for supervision of other normal pregnancy, unspecified trimester: Secondary | ICD-10-CM

## 2019-12-28 DIAGNOSIS — N883 Incompetence of cervix uteri: Secondary | ICD-10-CM

## 2019-12-28 DIAGNOSIS — Z3A15 15 weeks gestation of pregnancy: Secondary | ICD-10-CM

## 2019-12-28 NOTE — Patient Instructions (Signed)
Cervical Cerclage, Care After This sheet gives you information about how to care for yourself after your procedure. Your health care provider may also give you more specific instructions. If you have problems or questions, contact your health care provider. What can I expect after the procedure? After your procedure, it is common to have:  Cramping in your abdomen.  Mucus discharge from your vagina. This may last for several days.  Painful urination (dysuria).  Spotting, or small drops of blood coming from your vagina. Follow these instructions at home:  Medicines  Take over-the-counter and prescription medicines only as told by your health care provider.  Ask your health care provider if the medicine prescribed to you requires you to avoid driving or using heavy machinery. General instructions  If you are told to go on bed rest, follow instructions from your health care provider. You may need to be on bed rest for up to 3 days.  Keep track of your vaginal discharge and watch for any changes. If you notice changes, tell your health care provider.  Avoid physical activities and exercise until your health care provider approves. Ask your health care provider what activities are safe for you.  Do not douche or have sex until your health care provider says it is okay to do so.  Keep all follow-up visits, including prenatal visits, as told by your health care provider. This is important. ? Prenatal visits are all the care that you receive before the birth of your baby. ? You may also need an ultrasound.  You may be asked to have weekly visits to have your cervix checked. Contact a health care provider if you:  Have abnormal discharge from your vagina, such as clots.  Have a bad-smelling discharge from your vagina.  Develop a rash on your skin. This may look like redness and swelling.  Become light-headed or feel like you are going to faint.  Have abdominal pain that does not  get better with medicine.  Have nausea or vomiting that does not go away. Get help right away if you:  Have vaginal bleeding that is heavier or more frequent than spotting.  Are leaking fluid or your water breaks.  Have a fever or chills.  Faint.  Have uterine contractions. These may feel like: ? A back ache. ? Lower abdominal pain. ? Mild cramps, similar to menstrual cramps. ? Tightening or pressure in your abdomen.  Think that your baby is not moving as much as usual, or you cannot feel your baby move.  Have chest pain.  Have shortness of breath. Summary  After the procedure, it is common to have cramping, vaginal discharge, painful urination, and small drops of blood coming from your vagina.  If you are told to go on bed rest, follow instructions from your health care provider. You may need to be on bed rest for up to 3 days.  Keep track of your vaginal discharge and watch for any changes. If you notice changes, tell your health care provider.  Contact a health care provider if you have abnormal vaginal discharge, become light-headed, or have pain that cannot be controlled with medicines.  Get help right away if you have heavy vaginal bleeding, your water breaks, or you have uterine contractions. Also, get help right away if your baby is not moving as much as usual, or you have chest pain or shortness of breath. This information is not intended to replace advice given to you by your health care provider.   Make sure you discuss any questions you have with your health care provider. Document Revised: 08/10/2019 Document Reviewed: 06/09/2019 Elsevier Patient Education  2020 Elsevier Inc.  

## 2019-12-28 NOTE — Progress Notes (Addendum)
   PRENATAL VISIT NOTE  Subjective:  Sally Wiley is a 34 y.o. G2P0100 at 102w3d being seen today for ongoing prenatal care.  She is currently monitored for the following issues for this high-risk pregnancy and has Trichomonal infection; Cervical incompetence; Supervision of other normal pregnancy, antepartum; Short interval between pregnancies affecting pregnancy, antepartum; Cervical laceration; and Cervical cerclage suture present on their problem list.  Patient reports no complaints.  Contractions: Not present. Vag. Bleeding: None.  Movement: Absent. Denies leaking of fluid.   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   Objective:   Vitals:   12/28/19 0927  BP: 109/69  Pulse: 83  Weight: 118 lb (53.5 kg)    Fetal Status: Fetal Heart Rate (bpm): 154   Movement: Absent     General:  Alert, oriented and cooperative. Patient is in no acute distress.  Skin: Skin is warm and dry. No rash noted.   Cardiovascular: Normal heart rate noted  Respiratory: Normal respiratory effort, no problems with respiration noted  Abdomen: Soft, gravid, appropriate for gestational age.  Pain/Pressure: Present     Pelvic: Cervical exam deferred        Extremities: Normal range of motion.  Edema: None  Mental Status: Normal mood and affect. Normal behavior. Normal judgment and thought content.   Assessment and Plan:  Pregnancy: G2P0100 at [redacted]w[redacted]d 1. Supervision of other high risk pregnancy, antepartum, unspecified trimester      Panorama LR, AFP today - AFP, Serum, Open Spina Bifida  2. Supervision of other normal pregnancy, antepartum      Strict precautions for pain, pressure, leaking or  bleeding      May start back half time with limits to time on her feet      Call for any changes, close followoup     Start [place] next week  Preterm labor symptoms and general obstetric precautions including but not limited to vaginal bleeding, contractions, leaking of fluid and fetal movement were reviewed in detail with the patient. Please refer to After Visit Summary for other counseling recommendations.   Return in about 2 weeks (around 01/11/2020) for [facility].  Future Appointments  Date Time Provider Department Center  12/31/2019  9:30 AM [facility] [place] 1 [facility]  01/25/2020 10:10 AM Aviva Signs, CNM [facility] None    Wynelle Bourgeois, CNM

## 2019-12-30 LAB — AFP, SERUM, OPEN SPINA BIFIDA
AFP MoM: 0.5
AFP Value: 19.6 ng/mL
Gest. Age on Collection Date: 15.3 weeks
Maternal Age At EDD: 31.7 yr
OSBR Risk 1 IN: 10000
Test Results:: NEGATIVE
Weight: 118 [lb_av]

## 2019-12-31 ENCOUNTER — Encounter (HOSPITAL_COMMUNITY): Payer: Self-pay

## 2019-12-31 ENCOUNTER — Other Ambulatory Visit: Payer: Self-pay

## 2019-12-31 ENCOUNTER — Ambulatory Visit (HOSPITAL_COMMUNITY): Payer: BLUE CROSS/BLUE SHIELD | Admitting: *Deleted

## 2019-12-31 ENCOUNTER — Other Ambulatory Visit (HOSPITAL_COMMUNITY): Payer: Self-pay | Admitting: *Deleted

## 2019-12-31 ENCOUNTER — Ambulatory Visit (HOSPITAL_COMMUNITY)
Admission: RE | Admit: 2019-12-31 | Discharge: 2019-12-31 | Disposition: A | Discharge status: Home / Self Care | Payer: BLUE CROSS/BLUE SHIELD | Source: Ambulatory Visit | Attending: Obstetrics and Gynecology | Admitting: Obstetrics and Gynecology

## 2019-12-31 VITALS — BP 114/63 | HR 80 | Temp 97.3°F

## 2019-12-31 DIAGNOSIS — Z3480 Encounter for supervision of other normal pregnancy, unspecified trimester: Secondary | ICD-10-CM | POA: Diagnosis present

## 2019-12-31 DIAGNOSIS — N883 Incompetence of cervix uteri: Secondary | ICD-10-CM | POA: Diagnosis present

## 2019-12-31 DIAGNOSIS — O09892 Supervision of other high risk pregnancies, second trimester: Secondary | ICD-10-CM

## 2019-12-31 DIAGNOSIS — Z3A15 15 weeks gestation of pregnancy: Secondary | ICD-10-CM | POA: Diagnosis not present

## 2019-12-31 DIAGNOSIS — O3432 Maternal care for cervical incompetence, second trimester: Secondary | ICD-10-CM | POA: Diagnosis present

## 2020-01-18 ENCOUNTER — Telehealth: Payer: Self-pay

## 2020-01-18 NOTE — Telephone Encounter (Signed)
[facility] called stating that are trying to reach the patient in order to ship her makena injections. Patient needs to call her makena rep. Tobi Bastos at [phone] OR the pharmacy directly at [phone]   Called patient and made aware to call the numbers above so her 17P can be shipped to the office. Patient states she will call. Armandina Stammer RN

## 2020-01-21 ENCOUNTER — Ambulatory Visit (HOSPITAL_COMMUNITY): Payer: BLUE CROSS/BLUE SHIELD | Attending: Obstetrics and Gynecology

## 2020-01-21 ENCOUNTER — Ambulatory Visit (HOSPITAL_COMMUNITY): Admission: RE | Admit: 2020-01-21 | Payer: BLUE CROSS/BLUE SHIELD | Source: Ambulatory Visit

## 2020-01-21 ENCOUNTER — Encounter (HOSPITAL_COMMUNITY): Payer: Self-pay

## 2020-01-25 ENCOUNTER — Telehealth: Payer: Self-pay

## 2020-01-25 ENCOUNTER — Ambulatory Visit (INDEPENDENT_AMBULATORY_CARE_PROVIDER_SITE_OTHER): Payer: BLUE CROSS/BLUE SHIELD | Admitting: Advanced Practice Midwife

## 2020-01-25 ENCOUNTER — Other Ambulatory Visit: Payer: Self-pay

## 2020-01-25 ENCOUNTER — Encounter: Payer: Self-pay | Admitting: Advanced Practice Midwife

## 2020-01-25 VITALS — BP 108/63 | HR 88 | Wt 125.0 lb

## 2020-01-25 DIAGNOSIS — O09899 Supervision of other high risk pregnancies, unspecified trimester: Secondary | ICD-10-CM

## 2020-01-25 DIAGNOSIS — N750 Cyst of Bartholin's gland: Secondary | ICD-10-CM

## 2020-01-25 DIAGNOSIS — N883 Incompetence of cervix uteri: Secondary | ICD-10-CM

## 2020-01-25 DIAGNOSIS — O3432 Maternal care for cervical incompetence, second trimester: Secondary | ICD-10-CM

## 2020-01-25 DIAGNOSIS — Z3A19 19 weeks gestation of pregnancy: Secondary | ICD-10-CM

## 2020-01-25 DIAGNOSIS — O3462 Maternal care for abnormality of vagina, second trimester: Secondary | ICD-10-CM

## 2020-01-25 NOTE — Telephone Encounter (Signed)
[facility] representative Tobi Bastos called stating that patient did contact the pharmacy and they are still under insurance review of the medication. Tobi Bastos states that she will try and get one auto injector sent to our office in the meantime for patient to start her Makena injections. Armandina Stammer RN

## 2020-01-25 NOTE — Patient Instructions (Signed)

## 2020-01-25 NOTE — Progress Notes (Signed)
   PRENATAL VISIT NOTE  Subjective:  Sally Wiley is a 34 y.o. G2P0100 at [redacted]w[redacted]d being seen today for ongoing prenatal care.  She is currently monitored for the following issues for this high-risk pregnancy and has Trichomonal infection; Cervical incompetence; Supervision of other normal pregnancy, antepartum; Short interval between pregnancies affecting pregnancy, antepartum; Cervical laceration; and Cervical cerclage suture present on their problem list.  Patient reports no complaints.  Contractions: Not present. Vag. Bleeding: None.  Movement: Present. Denies leaking of fluid.   States Bartholins abscess recurred this month.Drained inED at [facility]  They did not have a Word catheter so did not place one.  Discussed may consider marsupialization PP.  Missed appointment for anatomy US. Rescheduled for April  The following portions of the patient's history were reviewed and updated as appropriate: allergies, current medications, past family history, past medical history, past social history, past surgical history and problem list.   Objective:   Vitals:   01/25/20 0958  BP: 108/63  Pulse: 88  Weight: 125 lb (56.7 kg)    Fetal Status: Fetal Heart Rate (bpm): 155 Fundal Height: 20 cm Movement: Present     General:  Alert, oriented and cooperative. Patient is in no acute distress.  Skin: Skin is warm and dry. No rash noted.   Cardiovascular: Normal heart rate noted  Respiratory: Normal respiratory effort, no problems with respiration noted  Abdomen: Soft, gravid, appropriate for gestational age.  Pain/Pressure: Absent     Pelvic: Cervical exam deferred        Extremities: Normal range of motion.  Edema: None  Mental Status: Normal mood and affect. Normal behavior. Normal judgment and thought content.   Panorama Low Risk Female  Assessment and Plan:  Pregnancy: G2P0100 at [redacted]w[redacted]d 1. Bartholin gland cyst     Drained      Consider marsupialization postpartum  2.   Cervical  incompetence      Still working to get her Makena arranged  Preterm labor symptoms and general obstetric precautions including but not limited to vaginal bleeding, contractions, leaking of fluid and fetal movement were reviewed in detail with the patient. Please refer to After Visit Summary for other counseling recommendations.   Return in about 4 weeks (around 02/22/2020) for [facility].  Future Appointments  Date Time Provider Department Center  02/11/2020 10:30 AM [facility] NURSE [facility] MFC-US  02/11/2020 10:30 AM [facility] [place] 1 [facility]-MFCUS MFC-US  02/24/2020 10:15 AM Adrian Blackwater, Rhona Raider, DO [facility] None    Wynelle Bourgeois, CNM

## 2020-01-25 NOTE — Progress Notes (Signed)
Patient no showed her anatomy ultrasound.Patient states she did call the [facility] care connect number I gave her last week- we are still awaiting the arrival of her 17P injections. Armandina Stammer RN

## 2020-01-31 ENCOUNTER — Ambulatory Visit (INDEPENDENT_AMBULATORY_CARE_PROVIDER_SITE_OTHER): Payer: BLUE CROSS/BLUE SHIELD | Admitting: *Deleted

## 2020-01-31 ENCOUNTER — Other Ambulatory Visit: Payer: Self-pay

## 2020-01-31 VITALS — BP 108/61 | HR 80

## 2020-01-31 DIAGNOSIS — O09292 Supervision of pregnancy with other poor reproductive or obstetric history, second trimester: Secondary | ICD-10-CM | POA: Diagnosis not present

## 2020-01-31 DIAGNOSIS — Z3480 Encounter for supervision of other normal pregnancy, unspecified trimester: Secondary | ICD-10-CM

## 2020-01-31 DIAGNOSIS — Z3A20 20 weeks gestation of pregnancy: Secondary | ICD-10-CM | POA: Diagnosis not present

## 2020-01-31 MED ORDER — HYDROXYPROGESTERONE CAPROATE 275 MG/1.1ML ~~LOC~~ SOAJ
275.0000 mg | Freq: Once | SUBCUTANEOUS | Status: AC
  Administered 2020-01-31: 275 mg via SUBCUTANEOUS

## 2020-01-31 NOTE — Progress Notes (Addendum)
Pt here for her first 17p, pt denies any issues at this time. Pt tolerated injection well. Advised that she have some local irritation and that is normal. Pt to call if she has any other issues and to follow up.   Attestation of Attending Supervision of RN: Evaluation and management procedures were performed by the nurse under my supervision and collaboration.  I have reviewed the nursing note and chart, and I agree with the management and plan.  Carolyn L. Harraway-Smith, M.D., Evern Core

## 2020-02-07 ENCOUNTER — Other Ambulatory Visit: Payer: Self-pay

## 2020-02-07 ENCOUNTER — Ambulatory Visit (INDEPENDENT_AMBULATORY_CARE_PROVIDER_SITE_OTHER): Payer: BLUE CROSS/BLUE SHIELD

## 2020-02-07 VITALS — BP 101/61 | HR 81 | Ht 63.0 in | Wt 126.0 lb

## 2020-02-07 DIAGNOSIS — O09899 Supervision of other high risk pregnancies, unspecified trimester: Secondary | ICD-10-CM | POA: Diagnosis not present

## 2020-02-07 DIAGNOSIS — Z3A00 Weeks of gestation of pregnancy not specified: Secondary | ICD-10-CM

## 2020-02-07 MED ORDER — HYDROXYPROGESTERONE CAPROATE 275 MG/1.1ML ~~LOC~~ SOAJ
275.0000 mg | Freq: Once | SUBCUTANEOUS | Status: AC
  Administered 2020-02-07: 275 mg via SUBCUTANEOUS

## 2020-02-07 NOTE — Progress Notes (Addendum)
Sally Wiley here for 17-P  Injection.  Injection administered without complication. Patient will return in one week for next injection.  Sally Wiley l Sally Wiley, CMA 02/07/2020  9:55 AM  Attestation of Attending Supervision of CMA/RN: Evaluation and management procedures were performed by the nurse under my supervision and collaboration.  I have reviewed the nursing note and chart, and I agree with the management and plan.  Carolyn L. Harraway-Smith, M.D., Evern Core

## 2020-02-11 ENCOUNTER — Other Ambulatory Visit (HOSPITAL_COMMUNITY): Payer: Self-pay | Admitting: *Deleted

## 2020-02-11 ENCOUNTER — Ambulatory Visit (HOSPITAL_COMMUNITY): Payer: BLUE CROSS/BLUE SHIELD | Admitting: *Deleted

## 2020-02-11 ENCOUNTER — Ambulatory Visit (HOSPITAL_COMMUNITY)
Admission: RE | Admit: 2020-02-11 | Discharge: 2020-02-11 | Disposition: A | Discharge status: Home / Self Care | Payer: BLUE CROSS/BLUE SHIELD | Source: Ambulatory Visit | Attending: Obstetrics | Admitting: Obstetrics

## 2020-02-11 ENCOUNTER — Encounter (HOSPITAL_COMMUNITY): Payer: Self-pay

## 2020-02-11 ENCOUNTER — Other Ambulatory Visit: Payer: Self-pay

## 2020-02-11 VITALS — BP 105/67 | HR 100 | Temp 97.5°F

## 2020-02-11 DIAGNOSIS — O09899 Supervision of other high risk pregnancies, unspecified trimester: Secondary | ICD-10-CM

## 2020-02-11 DIAGNOSIS — O09892 Supervision of other high risk pregnancies, second trimester: Secondary | ICD-10-CM | POA: Diagnosis not present

## 2020-02-11 DIAGNOSIS — Z363 Encounter for antenatal screening for malformations: Secondary | ICD-10-CM | POA: Diagnosis not present

## 2020-02-11 DIAGNOSIS — Z3A21 21 weeks gestation of pregnancy: Secondary | ICD-10-CM

## 2020-02-11 DIAGNOSIS — O3432 Maternal care for cervical incompetence, second trimester: Secondary | ICD-10-CM

## 2020-02-11 DIAGNOSIS — Z3480 Encounter for supervision of other normal pregnancy, unspecified trimester: Secondary | ICD-10-CM

## 2020-02-14 ENCOUNTER — Ambulatory Visit (INDEPENDENT_AMBULATORY_CARE_PROVIDER_SITE_OTHER): Payer: BLUE CROSS/BLUE SHIELD

## 2020-02-14 ENCOUNTER — Other Ambulatory Visit: Payer: Self-pay

## 2020-02-14 ENCOUNTER — Telehealth: Payer: Self-pay

## 2020-02-14 DIAGNOSIS — O09899 Supervision of other high risk pregnancies, unspecified trimester: Secondary | ICD-10-CM

## 2020-02-14 DIAGNOSIS — O09219 Supervision of pregnancy with history of pre-term labor, unspecified trimester: Secondary | ICD-10-CM | POA: Diagnosis not present

## 2020-02-14 MED ORDER — HYDROXYPROGESTERONE CAPROATE 275 MG/1.1ML ~~LOC~~ SOAJ
275.0000 mg | SUBCUTANEOUS | Status: DC
  Administered 2020-02-14 – 2020-04-03 (×7): 275 mg via SUBCUTANEOUS

## 2020-02-14 NOTE — Progress Notes (Addendum)
Patient presents for [facility] Injection. Patient tolerated injection well.   Attestation of Attending Supervision of RN: Evaluation and management procedures were performed by the nurse under my supervision and collaboration.  I have reviewed the nursing note and chart, and I agree with the management and plan.  Carolyn L. Harraway-Smith, M.D., Evern Core

## 2020-02-14 NOTE — Telephone Encounter (Signed)
Called [facility] care connect about pharmacy filling future [facility] prescriptions.  [facility] [phone].Armandina Stammer RN

## 2020-02-21 ENCOUNTER — Ambulatory Visit (INDEPENDENT_AMBULATORY_CARE_PROVIDER_SITE_OTHER): Payer: BLUE CROSS/BLUE SHIELD | Admitting: Obstetrics & Gynecology

## 2020-02-21 ENCOUNTER — Other Ambulatory Visit: Payer: Self-pay

## 2020-02-21 VITALS — BP 114/68 | HR 105 | Wt 129.0 lb

## 2020-02-21 DIAGNOSIS — Z3480 Encounter for supervision of other normal pregnancy, unspecified trimester: Secondary | ICD-10-CM

## 2020-02-21 DIAGNOSIS — O3432 Maternal care for cervical incompetence, second trimester: Secondary | ICD-10-CM

## 2020-02-21 DIAGNOSIS — O09892 Supervision of other high risk pregnancies, second trimester: Secondary | ICD-10-CM

## 2020-02-21 DIAGNOSIS — Z3A23 23 weeks gestation of pregnancy: Secondary | ICD-10-CM | POA: Diagnosis not present

## 2020-02-21 DIAGNOSIS — O3430 Maternal care for cervical incompetence, unspecified trimester: Secondary | ICD-10-CM

## 2020-02-21 DIAGNOSIS — O09899 Supervision of other high risk pregnancies, unspecified trimester: Secondary | ICD-10-CM

## 2020-02-21 NOTE — Progress Notes (Signed)
   PRENATAL VISIT NOTE  Subjective:  Sally Wiley is a 34 y.o. G2P0100 at [redacted]w[redacted]d being seen today for ongoing prenatal care.  She is currently monitored for the following issues for this high-risk pregnancy and has Trichomonal infection; Cervical incompetence; Supervision of other normal pregnancy, antepartum; Short interval between pregnancies affecting pregnancy, antepartum; Cervical laceration; and Cervical cerclage suture present on their problem list.  Patient reports no complaints.  Contractions: Not present. Vag. Bleeding: None.  Movement: Present. Denies leaking of fluid.   The following portions of the patient's history were reviewed and updated as appropriate: allergies, current medications, past family history, past medical history, past social history, past surgical history and problem list.   Objective:   Vitals:   02/21/20 1011  BP: 114/68  Pulse: (!) 105  Weight: 129 lb 0.6 oz (58.5 kg)    Fetal Status: Fetal Heart Rate (bpm): 172   Movement: Present     General:  Alert, oriented and cooperative. Patient is in no acute distress.  Skin: Skin is warm and dry. No rash noted.   Cardiovascular: Normal heart rate noted  Respiratory: Normal respiratory effort, no problems with respiration noted  Abdomen: Soft, gravid, appropriate for gestational age.  Pain/Pressure: Present     Pelvic: Cervical exam deferred        Extremities: Normal range of motion.  Edema: None  Mental Status: Normal mood and affect. Normal behavior. Normal judgment and thought content.   Assessment and Plan:  Pregnancy: G2P0100 at [redacted]w[redacted]d 1. Supervision of other normal pregnancy, antepartum FHR and FH WNL   2. Cervical cerclage suture present, antepartum 02/11/2020 Cervix  Length:            2.5  cm.  Measured transvaginally.  3. Short interval between pregnancies affecting pregnancy, antepartum On Makena shots weekly.   Preterm labor symptoms and general obstetric precautions including but  not limited to vaginal bleeding, contractions, leaking of fluid and fetal movement were reviewed in detail with the patient. Please refer to After Visit Summary for other counseling recommendations.   Return in about 4 weeks (around 03/20/2020) for in person.  Future Appointments  Date Time Provider Department Center  02/21/2020 10:45 AM Willodean Rosenthal, MD [facility] None  02/28/2020 10:00 AM [facility] NURSE [facility] None  03/06/2020 10:00 AM [facility] NURSE [facility] None  03/13/2020 10:00 AM [facility] NURSE [facility] None  03/20/2020 10:00 AM Willodean Rosenthal, MD [facility] None  03/24/2020 10:00 AM [facility] NURSE [facility]-MFC MFC-US  03/24/2020 10:00 AM [facility]-MFC [place] 1 WH-MFCUS MFC-US  03/28/2020 10:00 AM [facility] NURSE [facility] None    Willodean Rosenthal, MD

## 2020-02-24 ENCOUNTER — Encounter: Payer: BLUE CROSS/BLUE SHIELD | Admitting: Family Medicine

## 2020-02-28 ENCOUNTER — Ambulatory Visit (INDEPENDENT_AMBULATORY_CARE_PROVIDER_SITE_OTHER): Payer: BLUE CROSS/BLUE SHIELD

## 2020-02-28 ENCOUNTER — Other Ambulatory Visit: Payer: Self-pay

## 2020-02-28 DIAGNOSIS — O09899 Supervision of other high risk pregnancies, unspecified trimester: Secondary | ICD-10-CM | POA: Diagnosis not present

## 2020-02-28 DIAGNOSIS — Z3A00 Weeks of gestation of pregnancy not specified: Secondary | ICD-10-CM | POA: Diagnosis not present

## 2020-02-28 NOTE — Progress Notes (Addendum)
Patient present for 17P injection. Armandina Stammer RN \ Attestation of Attending Supervision of RN: Evaluation and management procedures were performed by the nurse under my supervision and collaboration.  I have reviewed the nursing note and chart, and I agree with the management and plan.  Carolyn L. Harraway-Smith, M.D., Evern Core

## 2020-02-29 ENCOUNTER — Inpatient Hospital Stay (HOSPITAL_COMMUNITY)
Admission: AD | Admit: 2020-02-29 | Discharge: 2020-02-29 | Disposition: A | Discharge status: Home / Self Care | Payer: BLUE CROSS/BLUE SHIELD | Attending: Obstetrics and Gynecology | Admitting: Obstetrics and Gynecology

## 2020-02-29 ENCOUNTER — Encounter (HOSPITAL_COMMUNITY): Payer: Self-pay | Admitting: Obstetrics and Gynecology

## 2020-02-29 DIAGNOSIS — N751 Abscess of Bartholin's gland: Secondary | ICD-10-CM

## 2020-02-29 DIAGNOSIS — Z3A24 24 weeks gestation of pregnancy: Secondary | ICD-10-CM

## 2020-02-29 DIAGNOSIS — O23592 Infection of other part of genital tract in pregnancy, second trimester: Secondary | ICD-10-CM | POA: Insufficient documentation

## 2020-02-29 DIAGNOSIS — R102 Pelvic and perineal pain: Secondary | ICD-10-CM | POA: Insufficient documentation

## 2020-02-29 LAB — URINALYSIS, ROUTINE W REFLEX MICROSCOPIC
Bilirubin Urine: NEGATIVE
Glucose, UA: NEGATIVE mg/dL
Hgb urine dipstick: NEGATIVE
Ketones, ur: 20 mg/dL — AB
Nitrite: NEGATIVE
Protein, ur: 30 mg/dL — AB
Specific Gravity, Urine: 1.026 (ref 1.005–1.030)
pH: 5 (ref 5.0–8.0)

## 2020-02-29 MED ORDER — LIDOCAINE HCL (PF) 1 % IJ SOLN
30.0000 mL | Freq: Once | INTRAMUSCULAR | Status: DC
  Filled 2020-02-29: qty 30

## 2020-02-29 MED ORDER — LIDOCAINE HCL URETHRAL/MUCOSAL 2 % EX GEL
1.0000 "application " | Freq: Once | CUTANEOUS | Status: AC
  Administered 2020-02-29: 1 via TOPICAL
  Filled 2020-02-29: qty 5

## 2020-02-29 MED ORDER — AMOXICILLIN-POT CLAVULANATE 875-125 MG PO TABS: 1.0000 | ORAL_TABLET | Freq: Two times a day (BID) | ORAL | 0 refills | Status: AC

## 2020-02-29 MED ORDER — OXYCODONE-ACETAMINOPHEN 5-325 MG PO TABS
2.0000 | ORAL_TABLET | Freq: Once | ORAL | Status: AC
  Administered 2020-02-29: 2 via ORAL
  Filled 2020-02-29: qty 2

## 2020-02-29 MED ORDER — OXYCODONE HCL 5 MG PO TABS: 5.0000 mg | ORAL_TABLET | Freq: Four times a day (QID) | ORAL | 0 refills | Status: AC | PRN

## 2020-02-29 NOTE — Discharge Instructions (Signed)
Bartholin's Cyst  A Bartholin's cyst is a fluid-filled sac that forms on a Bartholin's gland. Bartholin's glands are small glands in the folds of skin around the vaginal opening (labia). These glands produce a fluid to moisten (lubricate) the outside of the vagina during sex. A cyst that is not large or infected may not cause any problems or require treatment. If the cyst gets infected, it is called a Bartholin's abscess. An abscess may cause symptoms such as pain and swelling and is more likely to require treatment. What are the causes? This condition may be caused by a blocked Bartholin's gland. These glands can become blocked due to natural buildup of fluid and oils. Bacteria inside of the cyst can cause infection. In many cases, the cause is not known. What increases the risk? You may be at increased risk of developing a Bartholin's cyst or abscess if:  You are of childbearing age.  You have a history of Bartholin's cysts or abscesses.  You have diabetes.  You have an STI (sexually transmitted infection). What are the signs or symptoms? Symptoms may include:  A bulge or lump on the labia, near the lower opening of the vagina.  Discomfort or pain. This may get worse during sex or when walking.  Redness, swelling, or fluid draining from the area. These may be signs of an abscess. How severe your symptoms are depends on the size of your cyst and whether it is infected. Infection causes symptoms to get more severe. How is this diagnosed? This condition may be diagnosed based on:  Your symptoms and medical history.  A physical exam to check for swelling in your vaginal area. You may lie on your back on an exam table and have your feet placed into footrests for the exam.  Blood tests to check for infections.  Removal of a fluid sample from the cyst or abscess (biopsy) for testing. You may work with a health care provider who specializes in women's health (gynecologist) for diagnosis  and treatment. How is this treated? If your cyst is small, not infected, and not causing symptoms, you may not need any treatment. These cysts often go away on their own, with home care such as hot baths or warm compresses. If you have a large cyst or an abscess, treatment may include:  Antibiotic medicine.  A procedure to drain the fluid inside the cyst or abscess. These procedures involve making an incision in the cyst or abscess so that the fluid drains out, and then one of the following may be done: ? A small, thin tube (catheter) may be placed inside the cyst or abscess so that it does not close and fill up with fluid again (fistulization). The catheter will be removed at a follow-up visit. ? The edges of the incision may be stitched to your skin so that the cyst or abscess stays open (marsupialization). This allows it to continue to drain and not fill up with fluid again. If you have cysts or abscesses that keep returning (recurring) and have required incision and drainage multiple times, your health care provider may talk with you about surgery to remove the Bartholin's gland. Follow these instructions at home: Medicines  Take over-the-counter and prescription medicines only as told by your health care provider.  If you were prescribed an antibiotic medicine, take it as told by your health care provider. Do not stop taking the antibiotic even if your condition improves. Managing pain and swelling  Try sitz baths to help with   pain and swelling. A sitz bath is a warm water bath in which the water only comes up to your hips and should cover your buttocks. You may take sitz baths several times a day.  Apply heat to the affected area as often as needed. Use the heat source that your health care provider recommends, such as a moist heat pack or a heating pad. ? Place a towel between your skin and the heat source. ? Leave the heat on for 20-30 minutes. ? Remove the heat if your skin turns  bright red. This is especially important if you are unable to feel pain, heat, or cold. You may have a greater risk of getting burned. General instructions  If your cyst or abscess was drained, follow instructions from your health care provider about how to take care of your wound. Use feminine pads as needed to absorb any drainage.  Do not push on or squeeze your cyst.  Do not have sex until the cyst has gone away or your wound from drainage has healed.  Take these steps to help prevent a Bartholin's cyst from returning, and to prevent other Bartholin's cysts from developing: ? Take a bath or shower once a day. Clean your vaginal area with mild soap and water when you bathe. ? Practice safe sex to prevent STIs. Talk with your health care provider about how to prevent STIs and which forms of birth control (contraception) may be best for you.  Keep all follow-up visits as told by your health care provider. This is important. Contact a health care provider if:  You have a fever.  You develop redness, swelling, or pain around your cyst.  You have fluid, blood, pus, or a bad smell coming from your cyst.  You have a cyst that gets larger or comes back. Summary  A Bartholin's cyst is a fluid-filled sac that forms on a Bartholin's gland. These glands are in the folds of skin around the vaginal opening (labia).  If your cyst is small, not infected, and not causing symptoms, you may not need any treatment. These cysts often go away on their own, with home care such as hot baths or warm compresses.  If you have a large cyst or an abscess, your health care provider may perform a procedure to drain the fluid.  If you have cysts or abscesses that keep returning (recurring) and have required incision and drainage multiple times, your health care provider may talk with you about surgery to remove the Bartholin's gland. This information is not intended to replace advice given to you by your health  care provider. Make sure you discuss any questions you have with your health care provider. Document Revised: 08/06/2018 Document Reviewed: 07/16/2017 Elsevier Patient Education  2020 Elsevier Inc.  

## 2020-02-29 NOTE — MAU Provider Note (Addendum)
Chief Complaint:  Bartholin's Cyst   First Provider Initiated Contact with Patient 02/29/20 1028     HPI: Sally Wiley is a 34 y.o. G2P0100 at [redacted]w[redacted]d who presents to maternity admissions reporting vaginal pain. 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 This is her 4th episode in the last year (3rd for this year). Current symptoms started yesterday. Reports swelling & pain. Tried warm compresses without relief. States she feels like it needs to drain but nothing is coming out.   Location: right labia Quality: throbbing Severity: 10/10 in pain scale Duration: 2 days Timing: constant Modifying factors: worse with touch and sitting Associated signs and symptoms: none  Pregnancy Course: [facility], hx cervical incompetence, cerclage in place  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   Bartholin's gland abscess 09/13/2011   OB History  Gravida Para Term Preterm AB Living  2 1   1       SAB TAB Ectopic Multiple Live Births               # Outcome Date GA Lbr Len/2nd Weight Sex Delivery Anes PTL Lv  2 Current           1 Preterm 06/22/19 [redacted]w[redacted]d  439 g M   Y FD    Obstetric Comments  Had cervical incompetence with first pregnancy, delivered at 22wks, breech with head entrapment with subsequent Duhrssen's Incision and cervical repair. Occurred at [facility].   Past Surgical History:  Procedure Laterality Date   CERVICAL CERCLAGE N/A 12/16/2019   Procedure: CERCLAGE CERVICAL;  Surgeon: 12/18/2019, MD;  Location: [facility] LD ORS;  Service: Gynecology;  Laterality: N/A;   DILATION AND CURETTAGE OF UTERUS     Family History  Problem Relation Age of Onset   Cancer Paternal Grandmother    Heart disease Mother    Hypertension Mother    Diabetes Neg Hx    Social History   Tobacco Use   Smoking status: Never Smoker   Smokeless tobacco: Never Used  Substance Use Topics   Alcohol use: Not Currently    Comment:  socailly   Drug use: Never   Allergies  Allergen Reactions   Shellfish Allergy Hives   Facility-Administered Medications Prior to Admission  Medication Dose Route Frequency Provider Last Rate Last Admin   HYDROXYprogesterone caproate (Makena) autoinjector 275 mg  275 mg Subcutaneous Q7 days [place] Bing, MD   275 mg at 02/28/20 1015   Medications Prior to Admission  Medication Sig Dispense Refill Last Dose   acetaminophen (TYLENOL) 500 MG tablet Take 500 mg by mouth every 6 (six) hours as needed for mild pain.   02/28/2020 at Unknown time   HYDROXYprogesterone caproate [facility]) autoinjector Inject 275 mg into the skin every 7 (seven) days.   Past Week at Unknown time   Prenatal Vit-Fe Fumarate-FA (PRENATAL MULTIVITAMIN) TABS tablet Take 1 tablet by mouth daily at 12 noon.   02/28/2020 at Unknown time   AMBULATORY NON FORMULARY MEDICATION 1 Device by Other route once a week. Blood pressure cuff  Monitored weekly at home ICD 10: Z34.90 1 Device 0     I have reviewed patient's Past Medical Hx, Surgical Hx, Family Hx, Social Hx, medications and allergies.   ROS:  Review of Systems  Constitutional: Negative.   Gastrointestinal: Negative.   Genitourinary: Positive for vaginal discharge and vaginal pain.    Physical Exam   Patient Vitals for the past 24 hrs:  BP Temp Temp src Pulse Resp SpO2 Height Weight  02/29/20 0949 114/68 -- -- (!) 114 -- -- -- --  02/29/20 0928 110/66 98.7 F (37.1 C) Oral (!) 109 17 100 % 5\' 3"  (1.6 m) 58.4 kg    Constitutional: Well-developed, well-nourished female in no acute distress.  Cardiovascular: normal rate & rhythm, no murmur Respiratory: normal effort, lung sounds clear throughout GI: Abd soft, non-tender, gravid appropriate for gestational age. Pos BS x 4 MS: Extremities nontender, no edema, normal ROM Neurologic: Alert and oriented x 4.  GU:    ~6 cm right labial abscess with fluctuance, no drainage  NST:  Baseline: 150 bpm,  Variability: Good {> 6 bpm), Accelerations: Non-reactive but appropriate for gestational age and Decelerations: Absent   Labs: Results for orders placed or performed during the hospital encounter of 02/29/20 (from the past 24 hour(s))  Urinalysis, Routine w reflex microscopic     Status: Abnormal   Collection Time: 02/29/20  9:57 AM  Result Value Ref Range   Color, Urine YELLOW YELLOW   APPearance CLOUDY (A) CLEAR   Specific Gravity, Urine 1.026 1.005 - 1.030   pH 5.0 5.0 - 8.0   Glucose, UA NEGATIVE NEGATIVE mg/dL   Hgb urine dipstick NEGATIVE NEGATIVE   Bilirubin Urine NEGATIVE NEGATIVE   Ketones, ur 20 (A) NEGATIVE mg/dL   Protein, ur 30 (A) NEGATIVE mg/dL   Nitrite NEGATIVE NEGATIVE   Leukocytes,Ua LARGE (A) NEGATIVE   RBC / HPF 6-10 0 - 5 RBC/hpf   WBC, UA 21-50 0 - 5 WBC/hpf   Bacteria, UA MANY (A) NONE SEEN   Squamous Epithelial / LPF 11-20 0 - 5   Mucus PRESENT     Imaging:  No results found.  MAU Course: Orders Placed This Encounter  Procedures   Urinalysis, Routine w reflex microscopic   Discharge patient   Meds ordered this encounter  Medications   lidocaine (XYLOCAINE) 2 % jelly 1 application   lidocaine (PF) (XYLOCAINE) 1 % injection 30 mL   oxyCODONE-acetaminophen (PERCOCET/ROXICET) 5-325 MG per tablet 2 tablet   amoxicillin-clavulanate (AUGMENTIN) 875-125 MG tablet    Sig: Take 1 tablet by mouth 2 (two) times daily for 10 days.    Dispense:  20 tablet    Refill:  0    Order Specific Question:   Supervising Provider    Answer:   Rip Harbour, Jaelen Gellerman L [[id number]]   oxyCODONE (ROXICODONE) 5 MG immediate release tablet    Sig: Take 1 tablet (5 mg total) by mouth every 6 (six) hours as needed for up to 3 days for severe pain.    Dispense:  12 tablet    Refill:  0    Order Specific Question:   Supervising Provider    Answer:   Chancy Milroy [[id number]]    MDM: Fetal tracing appropriate for gestation  Dr. Rip Harbour on unit to manage patient - drainage &  marsupialization (see his note).   Will discharge patient with oral antibiotics & MD f/u next week    Procedure Note:  As noted above. Marsupialization discussed with pt. Pt desires to proceed  Informed consent was obtained. Pt was placed in DL position. Large right bartholin's cyst noted. Cleaned with Betadine and anesthized with 5 cc palin 1 % lidocaine. A small stab incision was made just inside the vagina over the bartholin's. Purulent discharge was noted. Culture was obtained. A hemostat was then placed inside the cyst breaking up any loculations. The cyst wall was then  secured the vagina in 4  areas with 3/0 Vicryl. Pt tolerated the procedure well. Discharge care, medications and follow up were reviewed with the pt.   Nettie Elm, MD  Assessment: 1. Abscess of right Bartholin's gland   2. [redacted] weeks gestation of pregnancy     Plan: Discharge home in stable condition.  Rx augmentin & oxycodone Msg to office for MD f/u next week    Allergies as of 02/29/2020       Reactions   Shellfish Allergy Hives        Medication List     TAKE these medications    acetaminophen 500 MG tablet Commonly known as: TYLENOL Take 500 mg by mouth every 6 (six) hours as needed for mild pain.   AMBULATORY NON FORMULARY MEDICATION 1 Device by Other route once a week. Blood pressure cuff  Monitored weekly at home ICD 10: Z34.90   amoxicillin-clavulanate 875-125 MG tablet Commonly known as: Augmentin Take 1 tablet by mouth 2 (two) times daily for 10 days.   Makena autoinjector Generic drug: HYDROXYprogesterone caproate Inject 275 mg into the skin every 7 (seven) days.   oxyCODONE 5 MG immediate release tablet Commonly known as: Roxicodone Take 1 tablet (5 mg total) by mouth every 6 (six) hours as needed for up to 3 days for severe pain.   prenatal multivitamin Tabs tablet Take 1 tablet by mouth daily at 12 noon.        Judeth Horn, NP 02/29/2020 11:55 AM

## 2020-02-29 NOTE — MAU Note (Signed)
Has a Bartholin's cyst on the right side.  First noted last night, getting bigger and more painful, no drainage.  Has had them in the past.

## 2020-03-04 LAB — AEROBIC CULTURE W GRAM STAIN (SUPERFICIAL SPECIMEN)

## 2020-03-06 ENCOUNTER — Other Ambulatory Visit: Payer: Self-pay

## 2020-03-06 ENCOUNTER — Ambulatory Visit (INDEPENDENT_AMBULATORY_CARE_PROVIDER_SITE_OTHER): Payer: BLUE CROSS/BLUE SHIELD | Admitting: Obstetrics & Gynecology

## 2020-03-06 VITALS — BP 119/64 | HR 92 | Wt 130.0 lb

## 2020-03-06 DIAGNOSIS — N883 Incompetence of cervix uteri: Secondary | ICD-10-CM | POA: Diagnosis not present

## 2020-03-06 DIAGNOSIS — O09892 Supervision of other high risk pregnancies, second trimester: Secondary | ICD-10-CM

## 2020-03-06 DIAGNOSIS — O3430 Maternal care for cervical incompetence, unspecified trimester: Secondary | ICD-10-CM

## 2020-03-06 DIAGNOSIS — O3432 Maternal care for cervical incompetence, second trimester: Secondary | ICD-10-CM | POA: Diagnosis not present

## 2020-03-06 DIAGNOSIS — Z3480 Encounter for supervision of other normal pregnancy, unspecified trimester: Secondary | ICD-10-CM

## 2020-03-06 DIAGNOSIS — R350 Frequency of micturition: Secondary | ICD-10-CM

## 2020-03-06 DIAGNOSIS — O2340 Unspecified infection of urinary tract in pregnancy, unspecified trimester: Secondary | ICD-10-CM | POA: Diagnosis not present

## 2020-03-06 DIAGNOSIS — O09899 Supervision of other high risk pregnancies, unspecified trimester: Secondary | ICD-10-CM

## 2020-03-06 DIAGNOSIS — Z3A25 25 weeks gestation of pregnancy: Secondary | ICD-10-CM

## 2020-03-06 MED ORDER — CEPHALEXIN 500 MG PO CAPS: 500.0000 mg | ORAL_CAPSULE | Freq: Two times a day (BID) | ORAL | 0 refills | Status: DC

## 2020-03-06 NOTE — Progress Notes (Signed)
   PRENATAL VISIT NOTE  Subjective:  Sally Wiley is a 34 y.o. G2P0100 at 110w2d being seen today for ongoing prenatal care.  She is currently monitored for the following issues for this high-risk pregnancy and has Trichomonal infection; Cervical incompetence; Supervision of other normal pregnancy, antepartum; Short interval between pregnancies affecting pregnancy, antepartum; Cervical laceration; and Cervical cerclage suture present on their problem list.  Patient reports urinary frequency. No blood in urine or pain but, pt reports voiding every 20 min.   . Vag. Bleeding: None.  Movement: Present. Denies leaking of fluid.   The following portions of the patient's history were reviewed and updated as appropriate: allergies, current medications, past family history, past medical history, past social history, past surgical history and problem list.   Objective:   Vitals:   03/06/20 1013  BP: 119/64  Pulse: 92  Weight: 130 lb (59 kg)    Fetal Status:     Movement: Present     General:  Alert, oriented and cooperative. Patient is in no acute distress.  Skin: Skin is warm and dry. No rash noted.   Cardiovascular: Normal heart rate noted  Respiratory: Normal respiratory effort, no problems with respiration noted  Abdomen: Soft, gravid, appropriate for gestational age.  Pain/Pressure: Present     Pelvic: Cervical exam performed in the presence of a chaperone      right bartholin's healing well  Extremities: Normal range of motion.  Edema: None  Mental Status: Normal mood and affect. Normal behavior. Normal judgment and thought content.   Urine: large leuk; no nitrates    Assessment and Plan:  Pregnancy: G2P0100 at [redacted]w[redacted]d 1. Supervision of other normal pregnancy, antepartum FH WNL FHR WNL 28 week labs next visit  2. Cervical cerclage suture present, antepartum Cervix closed. Cerclage palpated.   3. Short interval between pregnancies affecting pregnancy, antepartum  4. Cervical  incompetence See above  5. UTI suspected Pt on Augmentin for Bartholins.  send urine for cx   Preterm labor symptoms and general obstetric precautions including but not limited to vaginal bleeding, contractions, leaking of fluid and fetal movement were reviewed in detail with the patient. Please refer to After Visit Summary for other counseling recommendations.   Return in about 4 weeks (around 04/03/2020) for in person.  Future Appointments  Date Time Provider Department Center  03/13/2020 10:00 AM [facility] NURSE [facility] None  03/20/2020 10:00 AM Willodean Rosenthal, MD [facility] None  03/24/2020 10:00 AM [facility] NURSE [facility]  03/24/2020 10:00 AM [facility] US1 [facility]  03/28/2020 10:00 AM [facility] NURSE [facility] None    Willodean Rosenthal, MD\

## 2020-03-08 LAB — URINE CULTURE

## 2020-03-13 ENCOUNTER — Ambulatory Visit (INDEPENDENT_AMBULATORY_CARE_PROVIDER_SITE_OTHER): Payer: BLUE CROSS/BLUE SHIELD

## 2020-03-13 ENCOUNTER — Other Ambulatory Visit: Payer: Self-pay

## 2020-03-13 VITALS — BP 114/68 | HR 84 | Wt 130.1 lb

## 2020-03-13 DIAGNOSIS — Z8751 Personal history of pre-term labor: Secondary | ICD-10-CM | POA: Diagnosis not present

## 2020-03-13 DIAGNOSIS — O0990 Supervision of high risk pregnancy, unspecified, unspecified trimester: Secondary | ICD-10-CM

## 2020-03-13 DIAGNOSIS — Z3A26 26 weeks gestation of pregnancy: Secondary | ICD-10-CM | POA: Diagnosis not present

## 2020-03-13 NOTE — Progress Notes (Signed)
Joelene Millin here for 17-P  Injection.  Injection administered without complication. Patient will return in one week for next injection.  Tomoko Sandra l Autry Prust, CMA 03/13/2020  10:00 AM  Attestation of Attending Supervision of CMA/RN: Evaluation and management procedures were performed by the nurse under my supervision and collaboration.  I have reviewed the nursing note and chart, and I agree with the management and plan.  Carolyn L. Harraway-Smith, M.D., Evern Core

## 2020-03-15 ENCOUNTER — Telehealth: Payer: Self-pay

## 2020-03-15 NOTE — Telephone Encounter (Signed)
Called pt and advised her to call [facility] at [phone] to give them consent to ship her Makena 275 mg/1.1 ml.  Understanding was voiced.  Wai Minotti l Sanoe Hazan, CMA

## 2020-03-20 ENCOUNTER — Other Ambulatory Visit: Payer: Self-pay

## 2020-03-20 ENCOUNTER — Ambulatory Visit (INDEPENDENT_AMBULATORY_CARE_PROVIDER_SITE_OTHER): Payer: BLUE CROSS/BLUE SHIELD | Admitting: Obstetrics & Gynecology

## 2020-03-20 ENCOUNTER — Encounter: Payer: Self-pay | Admitting: Obstetrics & Gynecology

## 2020-03-20 VITALS — BP 116/63 | HR 95 | Wt 132.0 lb

## 2020-03-20 DIAGNOSIS — O3432 Maternal care for cervical incompetence, second trimester: Secondary | ICD-10-CM

## 2020-03-20 DIAGNOSIS — Z3A27 27 weeks gestation of pregnancy: Secondary | ICD-10-CM

## 2020-03-20 DIAGNOSIS — O3430 Maternal care for cervical incompetence, unspecified trimester: Secondary | ICD-10-CM

## 2020-03-20 DIAGNOSIS — O09899 Supervision of other high risk pregnancies, unspecified trimester: Secondary | ICD-10-CM

## 2020-03-20 DIAGNOSIS — Z3480 Encounter for supervision of other normal pregnancy, unspecified trimester: Secondary | ICD-10-CM

## 2020-03-20 DIAGNOSIS — O09892 Supervision of other high risk pregnancies, second trimester: Secondary | ICD-10-CM

## 2020-03-20 DIAGNOSIS — A599 Trichomoniasis, unspecified: Secondary | ICD-10-CM

## 2020-03-20 NOTE — Patient Instructions (Signed)
Third Trimester of Pregnancy The third trimester is from week 28 through week 40 (months 7 through 9). The third trimester is a time when the unborn baby (fetus) is growing rapidly. At the end of the ninth month, the fetus is about 20 inches in length and weighs 6-10 pounds. Body changes during your third trimester Your body will continue to go through many changes during pregnancy. The changes vary from woman to woman. During the third trimester:  Your weight will continue to increase. You can expect to gain 25-35 pounds (11-16 kg) by the end of the pregnancy.  You may begin to get stretch marks on your hips, abdomen, and breasts.  You may urinate more often because the fetus is moving lower into your pelvis and pressing on your bladder.  You may develop or continue to have heartburn. This is caused by increased hormones that slow down muscles in the digestive tract.  You may develop or continue to have constipation because increased hormones slow digestion and cause the muscles that push waste through your intestines to relax.  You may develop hemorrhoids. These are swollen veins (varicose veins) in the rectum that can itch or be painful.  You may develop swollen, bulging veins (varicose veins) in your legs.  You may have increased body aches in the pelvis, back, or thighs. This is due to weight gain and increased hormones that are relaxing your joints.  You may have changes in your hair. These can include thickening of your hair, rapid growth, and changes in texture. Some women also have hair loss during or after pregnancy, or hair that feels dry or thin. Your hair will most likely return to normal after your baby is born.  Your breasts will continue to grow and they will continue to become tender. A yellow fluid (colostrum) may leak from your breasts. This is the first milk you are producing for your baby.  Your belly button may stick out.  You may notice more swelling in your hands,  face, or ankles.  You may have increased tingling or numbness in your hands, arms, and legs. The skin on your belly may also feel numb.  You may feel short of breath because of your expanding uterus.  You may have more problems sleeping. This can be caused by the size of your belly, increased need to urinate, and an increase in your body's metabolism.  You may notice the fetus "dropping," or moving lower in your abdomen (lightening).  You may have increased vaginal discharge.  You may notice your joints feel loose and you may have pain around your pelvic bone. What to expect at prenatal visits You will have prenatal exams every 2 weeks until week 36. Then you will have weekly prenatal exams. During a routine prenatal visit:  You will be weighed to make sure you and the baby are growing normally.  Your blood pressure will be taken.  Your abdomen will be measured to track your baby's growth.  The fetal heartbeat will be listened to.  Any test results from the previous visit will be discussed.  You may have a cervical check near your due date to see if your cervix has softened or thinned (effaced).  You will be tested for Group B streptococcus. This happens between 35 and 37 weeks. Your health care provider may ask you:  What your birth plan is.  How you are feeling.  If you are feeling the baby move.  If you have had any abnormal   symptoms, such as leaking fluid, bleeding, severe headaches, or abdominal cramping.  If you are using any tobacco products, including cigarettes, chewing tobacco, and electronic cigarettes.  If you have any questions. Other tests or screenings that may be performed during your third trimester include:  Blood tests that check for low iron levels (anemia).  Fetal testing to check the health, activity level, and growth of the fetus. Testing is done if you have certain medical conditions or if there are problems during the pregnancy.  Nonstress test  (NST). This test checks the health of your baby to make sure there are no signs of problems, such as the baby not getting enough oxygen. During this test, a belt is placed around your belly. The baby is made to move, and its heart rate is monitored during movement. What is false labor? False labor is a condition in which you feel small, irregular tightenings of the muscles in the womb (contractions) that usually go away with rest, changing position, or drinking water. These are called Braxton Hicks contractions. Contractions may last for hours, days, or even weeks before true labor sets in. If contractions come at regular intervals, become more frequent, increase in intensity, or become painful, you should see your health care provider. What are the signs of labor?  Abdominal cramps.  Regular contractions that start at 10 minutes apart and become stronger and more frequent with time.  Contractions that start on the top of the uterus and spread down to the lower abdomen and back.  Increased pelvic pressure and dull back pain.  A watery or bloody mucus discharge that comes from the vagina.  Leaking of amniotic fluid. This is also known as your "water breaking." It could be a slow trickle or a gush. Let your health care provider know if it has a color or strange odor. If you have any of these signs, call your health care provider right away, even if it is before your due date. Follow these instructions at home: Medicines  Follow your health care provider's instructions regarding medicine use. Specific medicines may be either safe or unsafe to take during pregnancy.  Take a prenatal vitamin that contains at least 600 micrograms (mcg) of folic acid.  If you develop constipation, try taking a stool softener if your health care provider approves. Eating and drinking   Eat a balanced diet that includes fresh fruits and vegetables, whole grains, good sources of protein such as meat, eggs, or tofu,  and low-fat dairy. Your health care provider will help you determine the amount of weight gain that is right for you.  Avoid raw meat and uncooked cheese. These carry germs that can cause birth defects in the baby.  If you have low calcium intake from food, talk to your health care provider about whether you should take a daily calcium supplement.  Eat four or five small meals rather than three large meals a day.  Limit foods that are high in fat and processed sugars, such as fried and sweet foods.  To prevent constipation: ? Drink enough fluid to keep your urine clear or pale yellow. ? Eat foods that are high in fiber, such as fresh fruits and vegetables, whole grains, and beans. Activity  Exercise only as directed by your health care provider. Most women can continue their usual exercise routine during pregnancy. Try to exercise for 30 minutes at least 5 days a week. Stop exercising if you experience uterine contractions.  Avoid heavy lifting.  Do   not exercise in extreme heat or humidity, or at high altitudes.  Wear low-heel, comfortable shoes.  Practice good posture.  You may continue to have sex unless your health care provider tells you otherwise. Relieving pain and discomfort  Take frequent breaks and rest with your legs elevated if you have leg cramps or low back pain.  Take warm sitz baths to soothe any pain or discomfort caused by hemorrhoids. Use hemorrhoid cream if your health care provider approves.  Wear a good support bra to prevent discomfort from breast tenderness.  If you develop varicose veins: ? Wear support pantyhose or compression stockings as told by your healthcare provider. ? Elevate your feet for 15 minutes, 3-4 times a day. Prenatal care  Write down your questions. Take them to your prenatal visits.  Keep all your prenatal visits as told by your health care provider. This is important. Safety  Wear your seat belt at all times when driving.  Make  a list of emergency phone numbers, including numbers for family, friends, the hospital, and police and fire departments. General instructions  Avoid cat litter boxes and soil used by cats. These carry germs that can cause birth defects in the baby. If you have a cat, ask someone to clean the litter box for you.  Do not travel far distances unless it is absolutely necessary and only with the approval of your health care provider.  Do not use hot tubs, steam rooms, or saunas.  Do not drink alcohol.  Do not use any products that contain nicotine or tobacco, such as cigarettes and e-cigarettes. If you need help quitting, ask your health care provider.  Do not use any medicinal herbs or unprescribed drugs. These chemicals affect the formation and growth of the baby.  Do not douche or use tampons or scented sanitary pads.  Do not cross your legs for long periods of time.  To prepare for the arrival of your baby: ? Take prenatal classes to understand, practice, and ask questions about labor and delivery. ? Make a trial run to the hospital. ? Visit the hospital and tour the maternity area. ? Arrange for maternity or paternity leave through employers. ? Arrange for family and friends to take care of pets while you are in the hospital. ? Purchase a rear-facing car seat and make sure you know how to install it in your car. ? Pack your hospital bag. ? Prepare the baby's nursery. Make sure to remove all pillows and stuffed animals from the baby's crib to prevent suffocation.  Visit your dentist if you have not gone during your pregnancy. Use a soft toothbrush to brush your teeth and be gentle when you floss. Contact a health care provider if:  You are unsure if you are in labor or if your water has broken.  You become dizzy.  You have mild pelvic cramps, pelvic pressure, or nagging pain in your abdominal area.  You have lower back pain.  You have persistent nausea, vomiting, or  diarrhea.  You have an unusual or bad smelling vaginal discharge.  You have pain when you urinate. Get help right away if:  Your water breaks before 37 weeks.  You have regular contractions less than 5 minutes apart before 37 weeks.  You have a fever.  You are leaking fluid from your vagina.  You have spotting or bleeding from your vagina.  You have severe abdominal pain or cramping.  You have rapid weight loss or weight gain.  You have   shortness of breath with chest pain.  You notice sudden or extreme swelling of your face, hands, ankles, feet, or legs.  Your baby makes fewer than 10 movements in 2 hours.  You have severe headaches that do not go away when you take medicine.  You have vision changes. Summary  The third trimester is from week 28 through week 40, months 7 through 9. The third trimester is a time when the unborn baby (fetus) is growing rapidly.  During the third trimester, your discomfort may increase as you and your baby continue to gain weight. You may have abdominal, leg, and back pain, sleeping problems, and an increased need to urinate.  During the third trimester your breasts will keep growing and they will continue to become tender. A yellow fluid (colostrum) may leak from your breasts. This is the first milk you are producing for your baby.  False labor is a condition in which you feel small, irregular tightenings of the muscles in the womb (contractions) that eventually go away. These are called Braxton Hicks contractions. Contractions may last for hours, days, or even weeks before true labor sets in.  Signs of labor can include: abdominal cramps; regular contractions that start at 10 minutes apart and become stronger and more frequent with time; watery or bloody mucus discharge that comes from the vagina; increased pelvic pressure and dull back pain; and leaking of amniotic fluid. This information is not intended to replace advice given to you by your  health care provider. Make sure you discuss any questions you have with your health care provider. Document Revised: 02/04/2019 Document Reviewed: 11/19/2016 Elsevier Patient Education  2020 [employer]  

## 2020-03-20 NOTE — Progress Notes (Signed)
   PRENATAL VISIT NOTE  Subjective:  Sally Wiley is a 34 y.o. G2P0100 at [redacted]w[redacted]d being seen today for ongoing prenatal care.  She is currently monitored for the following issues for this high-risk pregnancy and has Trichomonal infection; Cervical incompetence; Supervision of other normal pregnancy, antepartum; Short interval between pregnancies affecting pregnancy, antepartum; Cervical laceration; and Cervical cerclage suture present on their problem list.  Patient reports no complaints.  Contractions: Irritability. Vag. Bleeding: None.  Movement: Present. Denies leaking of fluid.   The following portions of the patient's history were reviewed and updated as appropriate: allergies, current medications, past family history, past medical history, past social history, past surgical history and problem list.   Objective:   Vitals:   03/20/20 1006  BP: 116/63  Pulse: 95  Weight: 132 lb (59.9 kg)    Fetal Status: Fetal Heart Rate (bpm): 145   Movement: Present     General:  Alert, oriented and cooperative. Patient is in no acute distress.  Skin: Skin is warm and dry. No rash noted.   Cardiovascular: Normal heart rate noted  Respiratory: Normal respiratory effort, no problems with respiration noted  Abdomen: Soft, gravid, appropriate for gestational age.  Pain/Pressure: Present     Pelvic: Cervical exam deferred        Extremities: Normal range of motion.  Edema: None  Mental Status: Normal mood and affect. Normal behavior. Normal judgment and thought content.   Assessment and Plan:  Pregnancy: G2P0100 at [redacted]w[redacted]d 1. Supervision of other normal pregnancy, antepartum FHR and FH are WNL For 28 week labs on tomorrow. Pt not fasting currently.   2. Cervical cerclage suture present, antepartum Pt getting weekly 17-OH-P injections.    3. Short interval between pregnancies affecting pregnancy, antepartum   Preterm labor symptoms and general obstetric precautions including but not  limited to vaginal bleeding, contractions, leaking of fluid and fetal movement were reviewed in detail with the patient. Please refer to After Visit Summary for other counseling recommendations.   Return in about 2 weeks (around 04/03/2020).  Future Appointments  Date Time Provider Department Center  03/24/2020 10:00 AM [facility] NURSE [facility]  03/24/2020 10:00 AM [facility] US1 [facility]  03/28/2020  8:45 AM [facility] NURSE [facility] None  04/03/2020 10:15 AM Willodean Rosenthal, MD [facility] None  04/10/2020 10:00 AM [facility] NURSE [facility] None  04/17/2020 10:00 AM Levie Heritage, DO [facility] None  04/24/2020 10:00 AM [facility] NURSE [facility] None    Willodean Rosenthal, MD

## 2020-03-24 ENCOUNTER — Encounter: Payer: Self-pay | Admitting: *Deleted

## 2020-03-24 ENCOUNTER — Other Ambulatory Visit: Payer: Self-pay | Admitting: *Deleted

## 2020-03-24 ENCOUNTER — Ambulatory Visit: Payer: BLUE CROSS/BLUE SHIELD | Admitting: *Deleted

## 2020-03-24 ENCOUNTER — Other Ambulatory Visit: Payer: Self-pay

## 2020-03-24 ENCOUNTER — Ambulatory Visit (HOSPITAL_COMMUNITY): Payer: BLUE CROSS/BLUE SHIELD | Attending: Obstetrics and Gynecology

## 2020-03-24 DIAGNOSIS — O09899 Supervision of other high risk pregnancies, unspecified trimester: Secondary | ICD-10-CM | POA: Diagnosis not present

## 2020-03-24 DIAGNOSIS — Z3480 Encounter for supervision of other normal pregnancy, unspecified trimester: Secondary | ICD-10-CM | POA: Diagnosis present

## 2020-03-24 DIAGNOSIS — Z3A27 27 weeks gestation of pregnancy: Secondary | ICD-10-CM

## 2020-03-24 DIAGNOSIS — O3432 Maternal care for cervical incompetence, second trimester: Secondary | ICD-10-CM | POA: Diagnosis not present

## 2020-03-24 DIAGNOSIS — O3433 Maternal care for cervical incompetence, third trimester: Secondary | ICD-10-CM

## 2020-03-24 DIAGNOSIS — O09292 Supervision of pregnancy with other poor reproductive or obstetric history, second trimester: Secondary | ICD-10-CM

## 2020-03-28 ENCOUNTER — Other Ambulatory Visit: Payer: Self-pay

## 2020-03-28 ENCOUNTER — Ambulatory Visit (INDEPENDENT_AMBULATORY_CARE_PROVIDER_SITE_OTHER): Payer: BLUE CROSS/BLUE SHIELD

## 2020-03-28 ENCOUNTER — Ambulatory Visit: Payer: BLUE CROSS/BLUE SHIELD

## 2020-03-28 DIAGNOSIS — O09213 Supervision of pregnancy with history of pre-term labor, third trimester: Secondary | ICD-10-CM | POA: Diagnosis not present

## 2020-03-28 DIAGNOSIS — Z3A28 28 weeks gestation of pregnancy: Secondary | ICD-10-CM

## 2020-03-28 DIAGNOSIS — Z8751 Personal history of pre-term labor: Secondary | ICD-10-CM | POA: Diagnosis not present

## 2020-03-28 DIAGNOSIS — Z3480 Encounter for supervision of other normal pregnancy, unspecified trimester: Secondary | ICD-10-CM

## 2020-03-28 MED ORDER — HYDROXYPROGESTERONE CAPROATE 275 MG/1.1ML ~~LOC~~ SOAJ
275.0000 mg | Freq: Once | SUBCUTANEOUS | Status: AC
  Administered 2020-03-28: 275 mg via SUBCUTANEOUS

## 2020-03-28 NOTE — Progress Notes (Signed)
Pt here for Makena injection. Injection given in left arm and tolerated wll. Pt given lab forms for 28 week labs and sent to the lab.

## 2020-03-29 LAB — CBC
Hematocrit: 30.6 % — ABNORMAL LOW (ref 34.0–46.6)
Hemoglobin: 10.2 g/dL — ABNORMAL LOW (ref 11.1–15.9)
MCH: 29.8 pg (ref 26.6–33.0)
MCHC: 33.3 g/dL (ref 31.5–35.7)
MCV: 90 fL (ref 79–97)
Platelets: 308 10*3/uL (ref 150–450)
RBC: 3.42 x10E6/uL — ABNORMAL LOW (ref 3.77–5.28)
RDW: 12.4 % (ref 11.7–15.4)
WBC: 13.2 10*3/uL — ABNORMAL HIGH (ref 3.4–10.8)

## 2020-03-29 LAB — GLUCOSE TOLERANCE, 2 HOURS W/ 1HR
Glucose, 1 hour: 173 mg/dL (ref 65–179)
Glucose, 2 hour: 116 mg/dL (ref 65–152)
Glucose, Fasting: 75 mg/dL (ref 65–91)

## 2020-03-29 LAB — HIV ANTIBODY (ROUTINE TESTING W REFLEX): HIV Screen 4th Generation wRfx: NONREACTIVE

## 2020-03-29 LAB — RPR: RPR Ser Ql: NONREACTIVE

## 2020-04-03 ENCOUNTER — Other Ambulatory Visit: Payer: Self-pay

## 2020-04-03 ENCOUNTER — Encounter: Payer: Self-pay | Admitting: Obstetrics & Gynecology

## 2020-04-03 ENCOUNTER — Ambulatory Visit (INDEPENDENT_AMBULATORY_CARE_PROVIDER_SITE_OTHER): Payer: BLUE CROSS/BLUE SHIELD | Admitting: Obstetrics & Gynecology

## 2020-04-03 VITALS — BP 125/70 | HR 102 | Wt 134.0 lb

## 2020-04-03 DIAGNOSIS — O3433 Maternal care for cervical incompetence, third trimester: Secondary | ICD-10-CM

## 2020-04-03 DIAGNOSIS — Z3A29 29 weeks gestation of pregnancy: Secondary | ICD-10-CM

## 2020-04-03 DIAGNOSIS — Z23 Encounter for immunization: Secondary | ICD-10-CM

## 2020-04-03 DIAGNOSIS — O99019 Anemia complicating pregnancy, unspecified trimester: Secondary | ICD-10-CM | POA: Insufficient documentation

## 2020-04-03 DIAGNOSIS — Z3480 Encounter for supervision of other normal pregnancy, unspecified trimester: Secondary | ICD-10-CM

## 2020-04-03 DIAGNOSIS — O09893 Supervision of other high risk pregnancies, third trimester: Secondary | ICD-10-CM

## 2020-04-03 DIAGNOSIS — O3430 Maternal care for cervical incompetence, unspecified trimester: Secondary | ICD-10-CM

## 2020-04-03 DIAGNOSIS — O99013 Anemia complicating pregnancy, third trimester: Secondary | ICD-10-CM

## 2020-04-03 DIAGNOSIS — N883 Incompetence of cervix uteri: Secondary | ICD-10-CM

## 2020-04-03 DIAGNOSIS — D649 Anemia, unspecified: Secondary | ICD-10-CM

## 2020-04-03 DIAGNOSIS — O09899 Supervision of other high risk pregnancies, unspecified trimester: Secondary | ICD-10-CM

## 2020-04-03 MED ORDER — FERROUS SULFATE 324 (65 FE) MG PO TBEC: 1.0000 | DELAYED_RELEASE_TABLET | Freq: Every day | ORAL | 4 refills | Status: DC

## 2020-04-03 NOTE — Progress Notes (Signed)
   PRENATAL VISIT NOTE  Subjective:  Sally Wiley is a 34 y.o. G2P0100 at [redacted]w[redacted]d being seen today for ongoing prenatal care.  She is currently monitored for the following issues for this high-risk pregnancy and has Trichomonal infection; Cervical incompetence; Supervision of other normal pregnancy, antepartum; Short interval between pregnancies affecting pregnancy, antepartum; Cervical laceration; Cervical cerclage suture present; and Anemia, antepartum on their problem list.  Patient reports scant spotting x 1 yesterday. No pain. Wan in pink on toilet paper.  .  Contractions: Not present. Vag. Bleeding: Scant.  Movement: Present. Denies leaking of fluid.   The following portions of the patient's history were reviewed and updated as appropriate: allergies, current medications, past family history, past medical history, past social history, past surgical history and problem list.   Objective:   Vitals:   04/03/20 1010  BP: 125/70  Pulse: (!) 102  Weight: 134 lb (60.8 kg)    Fetal Status: Fetal Heart Rate (bpm): 155   Movement: Present     General:  Alert, oriented and cooperative. Patient is in no acute distress.  Skin: Skin is warm and dry. No rash noted.   Cardiovascular: Normal heart rate noted  Respiratory: Normal respiratory effort, no problems with respiration noted  Abdomen: Soft, gravid, appropriate for gestational age.  Pain/Pressure: Absent     Pelvic: Cervical exam deferred        Extremities: Normal range of motion.  Edema: None  Mental Status: Normal mood and affect. Normal behavior. Normal judgment and thought content.   Assessment and Plan:  Pregnancy: G2P0100 at [redacted]w[redacted]d 1. Supervision of other normal pregnancy, antepartum FHR and FH WNL Info given on childbirth classes.  Rec exam if bleeding occurs. Reviewed with pt when to be concerned with cerclage.    2. Cervical cerclage suture present, antepartum Remove at 36 weeks   3. Short interval between pregnancies  affecting pregnancy, antepartum  4. Cervical incompetence See above.   5. Anemia Begin FesO4.    Preterm labor symptoms and general obstetric precautions including but not limited to vaginal bleeding, contractions, leaking of fluid and fetal movement were reviewed in detail with the patient. Please refer to After Visit Summary for other counseling recommendations.   Return in about 2 weeks (around 04/17/2020) for in person.  Future Appointments  Date Time Provider Department Center  04/10/2020 10:00 AM [facility] NURSE [facility] None  04/17/2020 10:00 AM Levie Heritage, DO [facility] None  04/21/2020  2:30 PM [facility] NURSE [facility]  04/21/2020  2:30 PM [facility] US3 [facility]  04/24/2020 10:00 AM [facility] NURSE [facility] None    Willodean Rosenthal, MD

## 2020-04-03 NOTE — Patient Instructions (Signed)
Third Trimester of Pregnancy The third trimester is from week 28 through week 40 (months 7 through 9). The third trimester is a time when the unborn baby (fetus) is growing rapidly. At the end of the ninth month, the fetus is about 20 inches in length and weighs 6-10 pounds. Body changes during your third trimester Your body will continue to go through many changes during pregnancy. The changes vary from woman to woman. During the third trimester:  Your weight will continue to increase. You can expect to gain 25-35 pounds (11-16 kg) by the end of the pregnancy.  You may begin to get stretch marks on your hips, abdomen, and breasts.  You may urinate more often because the fetus is moving lower into your pelvis and pressing on your bladder.  You may develop or continue to have heartburn. This is caused by increased hormones that slow down muscles in the digestive tract.  You may develop or continue to have constipation because increased hormones slow digestion and cause the muscles that push waste through your intestines to relax.  You may develop hemorrhoids. These are swollen veins (varicose veins) in the rectum that can itch or be painful.  You may develop swollen, bulging veins (varicose veins) in your legs.  You may have increased body aches in the pelvis, back, or thighs. This is due to weight gain and increased hormones that are relaxing your joints.  You may have changes in your hair. These can include thickening of your hair, rapid growth, and changes in texture. Some women also have hair loss during or after pregnancy, or hair that feels dry or thin. Your hair will most likely return to normal after your baby is born.  Your breasts will continue to grow and they will continue to become tender. A yellow fluid (colostrum) may leak from your breasts. This is the first milk you are producing for your baby.  Your belly button may stick out.  You may notice more swelling in your hands,  face, or ankles.  You may have increased tingling or numbness in your hands, arms, and legs. The skin on your belly may also feel numb.  You may feel short of breath because of your expanding uterus.  You may have more problems sleeping. This can be caused by the size of your belly, increased need to urinate, and an increase in your body's metabolism.  You may notice the fetus "dropping," or moving lower in your abdomen (lightening).  You may have increased vaginal discharge.  You may notice your joints feel loose and you may have pain around your pelvic bone. What to expect at prenatal visits You will have prenatal exams every 2 weeks until week 36. Then you will have weekly prenatal exams. During a routine prenatal visit:  You will be weighed to make sure you and the baby are growing normally.  Your blood pressure will be taken.  Your abdomen will be measured to track your baby's growth.  The fetal heartbeat will be listened to.  Any test results from the previous visit will be discussed.  You may have a cervical check near your due date to see if your cervix has softened or thinned (effaced).  You will be tested for Group B streptococcus. This happens between 35 and 37 weeks. Your health care provider may ask you:  What your birth plan is.  How you are feeling.  If you are feeling the baby move.  If you have had any abnormal   symptoms, such as leaking fluid, bleeding, severe headaches, or abdominal cramping.  If you are using any tobacco products, including cigarettes, chewing tobacco, and electronic cigarettes.  If you have any questions. Other tests or screenings that may be performed during your third trimester include:  Blood tests that check for low iron levels (anemia).  Fetal testing to check the health, activity level, and growth of the fetus. Testing is done if you have certain medical conditions or if there are problems during the pregnancy.  Nonstress test  (NST). This test checks the health of your baby to make sure there are no signs of problems, such as the baby not getting enough oxygen. During this test, a belt is placed around your belly. The baby is made to move, and its heart rate is monitored during movement. What is false labor? False labor is a condition in which you feel small, irregular tightenings of the muscles in the womb (contractions) that usually go away with rest, changing position, or drinking water. These are called Braxton Hicks contractions. Contractions may last for hours, days, or even weeks before true labor sets in. If contractions come at regular intervals, become more frequent, increase in intensity, or become painful, you should see your health care provider. What are the signs of labor?  Abdominal cramps.  Regular contractions that start at 10 minutes apart and become stronger and more frequent with time.  Contractions that start on the top of the uterus and spread down to the lower abdomen and back.  Increased pelvic pressure and dull back pain.  A watery or bloody mucus discharge that comes from the vagina.  Leaking of amniotic fluid. This is also known as your "water breaking." It could be a slow trickle or a gush. Let your health care provider know if it has a color or strange odor. If you have any of these signs, call your health care provider right away, even if it is before your due date. Follow these instructions at home: Medicines  Follow your health care provider's instructions regarding medicine use. Specific medicines may be either safe or unsafe to take during pregnancy.  Take a prenatal vitamin that contains at least 600 micrograms (mcg) of folic acid.  If you develop constipation, try taking a stool softener if your health care provider approves. Eating and drinking   Eat a balanced diet that includes fresh fruits and vegetables, whole grains, good sources of protein such as meat, eggs, or tofu,  and low-fat dairy. Your health care provider will help you determine the amount of weight gain that is right for you.  Avoid raw meat and uncooked cheese. These carry germs that can cause birth defects in the baby.  If you have low calcium intake from food, talk to your health care provider about whether you should take a daily calcium supplement.  Eat four or five small meals rather than three large meals a day.  Limit foods that are high in fat and processed sugars, such as fried and sweet foods.  To prevent constipation: ? Drink enough fluid to keep your urine clear or pale yellow. ? Eat foods that are high in fiber, such as fresh fruits and vegetables, whole grains, and beans. Activity  Exercise only as directed by your health care provider. Most women can continue their usual exercise routine during pregnancy. Try to exercise for 30 minutes at least 5 days a week. Stop exercising if you experience uterine contractions.  Avoid heavy lifting.  Do   not exercise in extreme heat or humidity, or at high altitudes.  Wear low-heel, comfortable shoes.  Practice good posture.  You may continue to have sex unless your health care provider tells you otherwise. Relieving pain and discomfort  Take frequent breaks and rest with your legs elevated if you have leg cramps or low back pain.  Take warm sitz baths to soothe any pain or discomfort caused by hemorrhoids. Use hemorrhoid cream if your health care provider approves.  Wear a good support bra to prevent discomfort from breast tenderness.  If you develop varicose veins: ? Wear support pantyhose or compression stockings as told by your healthcare provider. ? Elevate your feet for 15 minutes, 3-4 times a day. Prenatal care  Write down your questions. Take them to your prenatal visits.  Keep all your prenatal visits as told by your health care provider. This is important. Safety  Wear your seat belt at all times when driving.  Make  a list of emergency phone numbers, including numbers for family, friends, the hospital, and police and fire departments. General instructions  Avoid cat litter boxes and soil used by cats. These carry germs that can cause birth defects in the baby. If you have a cat, ask someone to clean the litter box for you.  Do not travel far distances unless it is absolutely necessary and only with the approval of your health care provider.  Do not use hot tubs, steam rooms, or saunas.  Do not drink alcohol.  Do not use any products that contain nicotine or tobacco, such as cigarettes and e-cigarettes. If you need help quitting, ask your health care provider.  Do not use any medicinal herbs or unprescribed drugs. These chemicals affect the formation and growth of the baby.  Do not douche or use tampons or scented sanitary pads.  Do not cross your legs for long periods of time.  To prepare for the arrival of your baby: ? Take prenatal classes to understand, practice, and ask questions about labor and delivery. ? Make a trial run to the hospital. ? Visit the hospital and tour the maternity area. ? Arrange for maternity or paternity leave through employers. ? Arrange for family and friends to take care of pets while you are in the hospital. ? Purchase a rear-facing car seat and make sure you know how to install it in your car. ? Pack your hospital bag. ? Prepare the baby's nursery. Make sure to remove all pillows and stuffed animals from the baby's crib to prevent suffocation.  Visit your dentist if you have not gone during your pregnancy. Use a soft toothbrush to brush your teeth and be gentle when you floss. Contact a health care provider if:  You are unsure if you are in labor or if your water has broken.  You become dizzy.  You have mild pelvic cramps, pelvic pressure, or nagging pain in your abdominal area.  You have lower back pain.  You have persistent nausea, vomiting, or  diarrhea.  You have an unusual or bad smelling vaginal discharge.  You have pain when you urinate. Get help right away if:  Your water breaks before 37 weeks.  You have regular contractions less than 5 minutes apart before 37 weeks.  You have a fever.  You are leaking fluid from your vagina.  You have spotting or bleeding from your vagina.  You have severe abdominal pain or cramping.  You have rapid weight loss or weight gain.  You have   shortness of breath with chest pain.  You notice sudden or extreme swelling of your face, hands, ankles, feet, or legs.  Your baby makes fewer than 10 movements in 2 hours.  You have severe headaches that do not go away when you take medicine.  You have vision changes. Summary  The third trimester is from week 28 through week 40, months 7 through 9. The third trimester is a time when the unborn baby (fetus) is growing rapidly.  During the third trimester, your discomfort may increase as you and your baby continue to gain weight. You may have abdominal, leg, and back pain, sleeping problems, and an increased need to urinate.  During the third trimester your breasts will keep growing and they will continue to become tender. A yellow fluid (colostrum) may leak from your breasts. This is the first milk you are producing for your baby.  False labor is a condition in which you feel small, irregular tightenings of the muscles in the womb (contractions) that eventually go away. These are called Braxton Hicks contractions. Contractions may last for hours, days, or even weeks before true labor sets in.  Signs of labor can include: abdominal cramps; regular contractions that start at 10 minutes apart and become stronger and more frequent with time; watery or bloody mucus discharge that comes from the vagina; increased pelvic pressure and dull back pain; and leaking of amniotic fluid. This information is not intended to replace advice given to you by your  health care provider. Make sure you discuss any questions you have with your health care provider. Document Revised: 02/04/2019 Document Reviewed: 11/19/2016 Elsevier Patient Education  2020 [employer]  

## 2020-04-05 ENCOUNTER — Other Ambulatory Visit: Payer: Self-pay

## 2020-04-05 ENCOUNTER — Inpatient Hospital Stay (HOSPITAL_BASED_OUTPATIENT_CLINIC_OR_DEPARTMENT_OTHER): Payer: BLUE CROSS/BLUE SHIELD

## 2020-04-05 ENCOUNTER — Encounter (HOSPITAL_COMMUNITY): Payer: Self-pay | Admitting: Family Medicine

## 2020-04-05 ENCOUNTER — Inpatient Hospital Stay (HOSPITAL_COMMUNITY)
Admission: AD | Admit: 2020-04-05 | Discharge: 2020-04-19 | DRG: 768 | Disposition: A | Discharge status: Home / Self Care | Payer: BLUE CROSS/BLUE SHIELD | Attending: Obstetrics and Gynecology | Admitting: Obstetrics and Gynecology

## 2020-04-05 DIAGNOSIS — O09293 Supervision of pregnancy with other poor reproductive or obstetric history, third trimester: Secondary | ICD-10-CM

## 2020-04-05 DIAGNOSIS — O3433 Maternal care for cervical incompetence, third trimester: Secondary | ICD-10-CM

## 2020-04-05 DIAGNOSIS — O99019 Anemia complicating pregnancy, unspecified trimester: Secondary | ICD-10-CM

## 2020-04-05 DIAGNOSIS — O09299 Supervision of pregnancy with other poor reproductive or obstetric history, unspecified trimester: Secondary | ICD-10-CM | POA: Diagnosis present

## 2020-04-05 DIAGNOSIS — O09219 Supervision of pregnancy with history of pre-term labor, unspecified trimester: Secondary | ICD-10-CM | POA: Diagnosis not present

## 2020-04-05 DIAGNOSIS — O9902 Anemia complicating childbirth: Secondary | ICD-10-CM | POA: Diagnosis present

## 2020-04-05 DIAGNOSIS — Z20822 Contact with and (suspected) exposure to covid-19: Secondary | ICD-10-CM | POA: Diagnosis present

## 2020-04-05 DIAGNOSIS — Z3A29 29 weeks gestation of pregnancy: Secondary | ICD-10-CM

## 2020-04-05 DIAGNOSIS — O3430 Maternal care for cervical incompetence, unspecified trimester: Secondary | ICD-10-CM | POA: Diagnosis present

## 2020-04-05 DIAGNOSIS — Z8751 Personal history of pre-term labor: Secondary | ICD-10-CM | POA: Diagnosis not present

## 2020-04-05 DIAGNOSIS — O411230 Chorioamnionitis, third trimester, not applicable or unspecified: Secondary | ICD-10-CM | POA: Diagnosis present

## 2020-04-05 DIAGNOSIS — O99824 Streptococcus B carrier state complicating childbirth: Secondary | ICD-10-CM | POA: Diagnosis not present

## 2020-04-05 DIAGNOSIS — Z3A31 31 weeks gestation of pregnancy: Secondary | ICD-10-CM | POA: Diagnosis not present

## 2020-04-05 DIAGNOSIS — O42919 Preterm premature rupture of membranes, unspecified as to length of time between rupture and onset of labor, unspecified trimester: Secondary | ICD-10-CM | POA: Diagnosis present

## 2020-04-05 DIAGNOSIS — O42913 Preterm premature rupture of membranes, unspecified as to length of time between rupture and onset of labor, third trimester: Principal | ICD-10-CM | POA: Diagnosis present

## 2020-04-05 DIAGNOSIS — Z3A30 30 weeks gestation of pregnancy: Secondary | ICD-10-CM | POA: Diagnosis not present

## 2020-04-05 DIAGNOSIS — Z8759 Personal history of other complications of pregnancy, childbirth and the puerperium: Secondary | ICD-10-CM

## 2020-04-05 DIAGNOSIS — O42113 Preterm premature rupture of membranes, onset of labor more than 24 hours following rupture, third trimester: Secondary | ICD-10-CM | POA: Diagnosis not present

## 2020-04-05 DIAGNOSIS — N883 Incompetence of cervix uteri: Secondary | ICD-10-CM | POA: Diagnosis present

## 2020-04-05 DIAGNOSIS — Z3480 Encounter for supervision of other normal pregnancy, unspecified trimester: Secondary | ICD-10-CM

## 2020-04-05 LAB — CBC
HCT: 32.1 % — ABNORMAL LOW (ref 36.0–46.0)
Hemoglobin: 10.4 g/dL — ABNORMAL LOW (ref 12.0–15.0)
MCH: 30 pg (ref 26.0–34.0)
MCHC: 32.4 g/dL (ref 30.0–36.0)
MCV: 92.5 fL (ref 80.0–100.0)
Platelets: 323 10*3/uL (ref 150–400)
RBC: 3.47 MIL/uL — ABNORMAL LOW (ref 3.87–5.11)
RDW: 13 % (ref 11.5–15.5)
WBC: 13.7 10*3/uL — ABNORMAL HIGH (ref 4.0–10.5)
nRBC: 0 % (ref 0.0–0.2)

## 2020-04-05 LAB — POCT FERN TEST: POCT Fern Test: POSITIVE

## 2020-04-05 LAB — GROUP B STREP BY PCR: Group B strep by PCR: POSITIVE — AB

## 2020-04-05 LAB — SARS CORONAVIRUS 2 BY RT PCR (HOSPITAL ORDER, PERFORMED IN ~~LOC~~ HOSPITAL LAB): SARS Coronavirus 2: NEGATIVE

## 2020-04-05 MED ORDER — AZITHROMYCIN 250 MG PO TABS
1000.0000 mg | ORAL_TABLET | Freq: Once | ORAL | Status: AC
  Administered 2020-04-05: 1000 mg via ORAL
  Filled 2020-04-05: qty 4

## 2020-04-05 MED ORDER — ZOLPIDEM TARTRATE 5 MG PO TABS: 5.0000 mg | ORAL_TABLET | Freq: Every evening | ORAL | Status: DC | PRN

## 2020-04-05 MED ORDER — PRENATAL MULTIVITAMIN CH
1.0000 | ORAL_TABLET | Freq: Every day | ORAL | Status: DC
  Administered 2020-04-05 – 2020-04-15 (×11): 1 via ORAL
  Filled 2020-04-05 (×12): qty 1

## 2020-04-05 MED ORDER — BETAMETHASONE SOD PHOS & ACET 6 (3-3) MG/ML IJ SUSP
12.0000 mg | INTRAMUSCULAR | Status: AC
  Administered 2020-04-05 – 2020-04-06 (×2): 12 mg via INTRAMUSCULAR
  Filled 2020-04-05: qty 5

## 2020-04-05 MED ORDER — SODIUM CHLORIDE 0.9 % IV SOLN
2.0000 g | Freq: Four times a day (QID) | INTRAVENOUS | Status: AC
  Administered 2020-04-05 – 2020-04-06 (×8): 2 g via INTRAVENOUS
  Filled 2020-04-05 (×8): qty 2000

## 2020-04-05 MED ORDER — AMOXICILLIN 500 MG PO CAPS
500.0000 mg | ORAL_CAPSULE | Freq: Three times a day (TID) | ORAL | Status: AC
  Administered 2020-04-07 – 2020-04-11 (×15): 500 mg via ORAL
  Filled 2020-04-05 (×16): qty 1

## 2020-04-05 MED ORDER — ACETAMINOPHEN 325 MG PO TABS
650.0000 mg | ORAL_TABLET | ORAL | Status: DC | PRN
  Administered 2020-04-06 – 2020-04-16 (×6): 650 mg via ORAL
  Filled 2020-04-05 (×8): qty 2

## 2020-04-05 MED ORDER — CALCIUM CARBONATE ANTACID 500 MG PO CHEW: 2.0000 | CHEWABLE_TABLET | ORAL | Status: DC | PRN

## 2020-04-05 MED ORDER — LACTATED RINGERS IV SOLN: INTRAVENOUS | Status: DC

## 2020-04-05 MED ORDER — DOCUSATE SODIUM 100 MG PO CAPS
100.0000 mg | ORAL_CAPSULE | Freq: Every day | ORAL | Status: DC
  Administered 2020-04-05 – 2020-04-15 (×11): 100 mg via ORAL
  Filled 2020-04-05 (×12): qty 1

## 2020-04-05 NOTE — H&P (Signed)
Sally Wiley is a 34 y.o. female presenting for ruptured membranes tonight  Woke her out of her sleep @0400 .   Has cerclage in place.  No pain or contractions.  No bleeding.   Has been receiving weekly Makena injections  RN Note: PT SAYS SROM AT  0400.  HAS CIRCLAGE .   NO PAIN  . OB History    Gravida  2   Para  1   Term      Preterm  1   AB      Living        SAB      TAB      Ectopic      Multiple      Live Births           Obstetric Comments  Had cervical incompetence with first pregnancy, delivered at 22wks, breech with head entrapment with subsequent Duhrssen's Incision and cervical repair. Occurred at [facility].       Past Medical History:  Diagnosis Date  . Bartholin gland cyst 09/13/2011  . Bartholin's gland abscess 09/13/2011   Past Surgical History:  Procedure Laterality Date  . CERVICAL CERCLAGE N/A 12/16/2019   Procedure: CERCLAGE CERVICAL;  Surgeon: Aletha Halim, MD;  Location: [facility] LD ORS;  Service: Gynecology;  Laterality: N/A;  . DILATION AND CURETTAGE OF UTERUS     Family History: family history includes Cancer in her paternal grandmother; Heart disease in her mother; Hypertension in her mother. Social History:  reports that she has never smoked. She has never used smokeless tobacco. She reports previous alcohol use. She reports that she does not use drugs.   Review of Systems  Constitutional: Negative for chills, fatigue and fever.  Respiratory: Negative for shortness of breath.   Gastrointestinal: Negative for abdominal pain, constipation, diarrhea and nausea.  Genitourinary: Positive for vaginal discharge. Negative for pelvic pain and vaginal bleeding.  Musculoskeletal: Negative for back pain.  Neurological: Negative for dizziness and weakness.   Maternal Medical History:  Reason for admission: Rupture of membranes.  Nausea.  Contractions: Onset was 1-2 hours ago.   Frequency: rare.   Perceived severity is  mild.    Fetal activity: Perceived fetal activity is normal.   Last perceived fetal movement was within the past hour.    Prenatal complications: Preterm labor.   No bleeding, PIH, infection, oligohydramnios, polyhydramnios, pre-eclampsia or substance abuse.   Prenatal Complications - Diabetes: none.      Blood pressure 116/67, pulse 99, temperature 98.2 F (36.8 C), temperature source Oral, resp. rate 18, height 5\' 3"  (1.6 m), weight 60.3 kg, last menstrual period 09/24/2019, unknown if currently breastfeeding. Maternal Exam:  Uterine Assessment: Contraction strength is mild.  Contraction frequency is irregular and rare.   Abdomen: Patient reports no abdominal tenderness. Introitus: Normal vulva. Vagina is positive for vaginal discharge.  Ferning test: not done.  Nitrazine test: not done. Amniotic fluid character: clear. Copious clear fluid draining from vagina.  Sterile speculum exam with large amount of clear fluid.  Cervix appears closed, no bleeding.   Cervix: Cervix evaluated by sterile speculum exam.     Fetal Exam Fetal Monitor Review: Mode: ultrasound.   Baseline rate: 140.  Variability: moderate (6-25 bpm).   Pattern: accelerations present and no decelerations.    Fetal State Assessment: Category I - tracings are normal.     Physical Exam  Constitutional: She is oriented to person, place, and time. She appears well-developed and well-nourished. No distress.  HENT:  Head: Normocephalic.  Cardiovascular: Normal rate and regular rhythm.  Respiratory: Effort normal. No respiratory distress.  GI: Soft. She exhibits no distension. There is no abdominal tenderness. There is no rebound and no guarding.  Genitourinary:    Vulva normal.     Vaginal discharge present.     Genitourinary Comments: SSE:  Copious clear fluid, cx appears closed. Unable to see stitch well   Musculoskeletal:        General: Normal range of motion.  Neurological: She is alert and oriented to  person, place, and time.  Skin: Skin is warm and dry.  Psychiatric: She has a normal mood and affect.    Prenatal labs: ABO, Rh: B/Positive/-- (01/28 0943) Antibody: Positive, See Final Results (01/28 0943) Rubella: 12.70 (01/28 0943) RPR: Non Reactive (06/01 0908)  HBsAg: Negative (01/28 0943)  HIV: Non Reactive (06/01 0908)  GBS:     Assessment/Plan: Single IUP at [redacted]w[redacted]d Preterm Premature Rupture of membranes Cerclage in place  Admit to [facility] unit per Dr Adrian Blackwater, Gildardo Cranker by Dr Eric Form Routine orders Amp/Azithro regimen [place] in am Betamethasone MD to follow     Wynelle Bourgeois 04/05/2020, 5:12 AM

## 2020-04-05 NOTE — MAU Note (Signed)
PT SAYS SROM AT  0400.  HAS CIRCLAGE .   NO PAIN

## 2020-04-06 LAB — GC/CHLAMYDIA PROBE AMP (~~LOC~~) NOT AT ARMC
Chlamydia: NEGATIVE
Comment: NEGATIVE
Comment: NORMAL
Neisseria Gonorrhea: NEGATIVE

## 2020-04-06 NOTE — Progress Notes (Signed)
Patient ID: Sally Wiley, female   DOB: [birth year], 34 y.o.   MRN: [id number] ACULTY PRACTICE ANTEPARTUM COMPREHENSIVE PROGRESS NOTE  Sally Wiley is a 34 y.o. G2P0100 at [redacted]w[redacted]d  who is admitted for PROM.   Fetal presentation is cephalic. By U/S yesterday Length of Stay:  1  Days  Subjective: Pt reports no leaking this morning. Denies cramps or ut ctx or vaginal bleeding Patient reports good fetal movement.    Vitals:  Blood pressure 118/64, pulse 95, temperature 98.5 F (36.9 C), temperature source Oral, resp. rate 18, height 5\' 3"  (1.6 m), weight 60.3 kg, last menstrual period 09/24/2019, SpO2 100 %, unknown if currently breastfeeding.   Physical Examination: Lungs clear Heart RRR Abd soft + BS gravid non tender GU deferred Ext non tender  Fetal Monitoring:  120's, + accels, no decels, no ut ctx  Labs:  Results for orders placed or performed during the hospital encounter of 04/05/20 (from the past 24 hour(s))  Group B strep by PCR   Collection Time: 04/05/20  9:20 AM   Specimen: PATH Cytology Cervicovaginal Ancillary Only; Genital  Result Value Ref Range   Group B strep by PCR POSITIVE (A) NEGATIVE    Imaging Studies:    See U/S report from yesterday   Medications:  Scheduled . [START ON 04/07/2020] amoxicillin  500 mg Oral TID  . docusate sodium  100 mg Oral Daily  . prenatal multivitamin  1 tablet Oral Q1200   I have reviewed the patient's current medications.  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 No S/Sx of infection. Has completed BMZ. Continue with latency antibiotics. Delivery at 34 weeks or for maternal fetal indications. Remove cerclage at that time or sooner if delivery immediate Continue routine antenatal care.   7/7 04/06/2020,7:49 AM

## 2020-04-07 MED ORDER — POLYETHYLENE GLYCOL 3350 17 G PO PACK
17.0000 g | PACK | Freq: Every day | ORAL | Status: DC | PRN
  Administered 2020-04-08: 17 g via ORAL
  Filled 2020-04-07 (×2): qty 1

## 2020-04-07 NOTE — Progress Notes (Signed)
Patient ID: Sally Wiley, female   DOB: [birth year], 34 y.o.   MRN: [id number] ACULTY PRACTICE ANTEPARTUM COMPREHENSIVE PROGRESS NOTE  Sally Wiley is a 34 y.o. G2P0100 at [redacted]w[redacted]d  who is admitted for PROM.   Fetal presentation is cephalic. Length of Stay:  2  Days  Subjective: Pt has no complaints today Patient reports good fetal movement.  She reports no uterine contractions, no bleeding and no loss of fluid per vagina.  Vitals:  Blood pressure (!) 107/58, pulse 90, temperature 98.9 F (37.2 C), resp. rate 16, height 5\' 3"  (1.6 m), weight 60.3 kg, last menstrual period 09/24/2019, SpO2 99 %, unknown if currently breastfeeding.   Physical Examination: Lungs clear Heart RRR Abd soft + Bs gravid non tender GU deferred Ext non tender  Fetal Monitoring:  130's, + accesl, 1 decel noted, no ut ctx  Labs:  No results found for this or any previous visit (from the past 24 hour(s)).  Imaging Studies:    NA   Medications:  Scheduled . amoxicillin  500 mg Oral TID  . docusate sodium  100 mg Oral Daily  . prenatal multivitamin  1 tablet Oral Q1200   I have reviewed the patient's current medications.  ASSESSMENT: IUP 29 6/7 weeks PROM H/O incompetent cervix, s/p cerclage  PLAN: Stable. S/P BMZ. Continue with latency antibiotics. No S/Sx of infection at present Delivery at 34 weeks or for maternal/fetal indications. Remove cerclage at onset of labor. Continue routine antenatal care.   09/26/2019 04/07/2020,10:53 AM

## 2020-04-07 NOTE — Plan of Care (Signed)
  Problem: Education: Goal: Knowledge of disease or condition will improve Outcome: Completed/Met Goal: Knowledge of the prescribed therapeutic regimen will improve Outcome: Completed/Met   Problem: Education: Goal: Knowledge of General Education information will improve Description: Including pain rating scale, medication(s)/side effects and non-pharmacologic comfort measures Outcome: Completed/Met   Problem: Activity: Goal: Risk for activity intolerance will decrease Outcome: Completed/Met   Problem: Nutrition: Goal: Adequate nutrition will be maintained Outcome: Completed/Met   Problem: Coping: Goal: Level of anxiety will decrease Outcome: Completed/Met   Problem: Elimination: Goal: Will not experience complications related to urinary retention Outcome: Completed/Met   Problem: Skin Integrity: Goal: Risk for impaired skin integrity will decrease Outcome: Completed/Met

## 2020-04-08 DIAGNOSIS — Z3A30 30 weeks gestation of pregnancy: Secondary | ICD-10-CM

## 2020-04-08 LAB — BPAM RBC
Blood Product Expiration Date: 202107112359
Blood Product Expiration Date: 202107112359
Unit Type and Rh: 5100
Unit Type and Rh: 5100

## 2020-04-08 LAB — TYPE AND SCREEN
ABO/RH(D): AB POS
ABO/RH(D): AB POS
Antibody Screen: NEGATIVE
Antibody Screen: NEGATIVE
Unit division: 0
Unit division: 0

## 2020-04-08 LAB — CBC
HCT: 29.9 % — ABNORMAL LOW (ref 36.0–46.0)
Hemoglobin: 9.6 g/dL — ABNORMAL LOW (ref 12.0–15.0)
MCH: 29.9 pg (ref 26.0–34.0)
MCHC: 32.1 g/dL (ref 30.0–36.0)
MCV: 93.1 fL (ref 80.0–100.0)
Platelets: 304 10*3/uL (ref 150–400)
RBC: 3.21 MIL/uL — ABNORMAL LOW (ref 3.87–5.11)
RDW: 13.3 % (ref 11.5–15.5)
WBC: 23.1 10*3/uL — ABNORMAL HIGH (ref 4.0–10.5)
nRBC: 0.3 % — ABNORMAL HIGH (ref 0.0–0.2)

## 2020-04-08 NOTE — Progress Notes (Signed)
Patient ID: Sally Wiley, female   DOB: 1986-09-30, 34 y.o.   MRN: [id number] FACULTY PRACTICE ANTEPARTUM(COMPREHENSIVE) NOTE  Sally Wiley is a 34 y.o. G2P0100 with Estimated Date of Delivery: 06/17/20   By   [id number]  who is admitted for PROM.    Fetal presentation is cephalic. Length of Stay:  3  Days  Date of admission:04/05/2020  Subjective: No complaints Patient reports the fetal movement as active. Patient reports uterine contraction  activity as none. Patient reports  vaginal bleeding as none. Patient describes fluid per vagina as None.  Vitals:  Blood pressure 107/63, pulse 89, temperature 98.8 F (37.1 C), temperature source Oral, resp. rate 16, height 5\' 3"  (1.6 m), weight 60.3 kg, last menstrual period 09/24/2019, SpO2 99 %, unknown if currently breastfeeding. Vitals:   04/07/20 1202 04/07/20 1557 04/07/20 1909 04/07/20 2206  BP: (!) 100/59 (!) 108/55 110/64 107/63  Pulse: (!) 105 95 92 89  Resp: 16 15 16 16   Temp: 98.4 F (36.9 C) 98.4 F (36.9 C) 98.9 F (37.2 C) 98.8 F (37.1 C)  TempSrc: Oral Oral Oral Oral  SpO2: 99% 98% 98% 99%  Weight:      Height:       Physical Examination:  General appearance - alert, well appearing, and in no distress Abdomen - soft, nontender, nondistended, no masses or organomegaly Fundal Height:  size equals dates Pelvic Exam:  examination not indicated Cervical Exam: Not evaluated. . Extremities: extremities normal, atraumatic, no cyanosis or edema with DTRs 2+ bilaterally Membranes:ruptured, clear fluid  Fetal Monitoring:  Baseline: 150s bpm, Variability: Good {> 6 bpm), Accelerations: Reactive and Decelerations: Absent   reactive  Labs:  Results for orders placed or performed during the hospital encounter of 04/05/20 (from the past 24 hour(s))  CBC   Collection Time: 04/08/20  4:48 AM  Result Value Ref Range   WBC 23.1 (H) 4.0 - 10.5 K/uL   RBC 3.21 (L) 3.87 - 5.11 MIL/uL   Hemoglobin 9.6 (L) 12.0 - 15.0 g/dL   HCT  40.929.9 (L) 36 - 46 %   MCV 93.1 80.0 - 100.0 fL   MCH 29.9 26.0 - 34.0 pg   MCHC 32.1 30.0 - 36.0 g/dL   RDW 81.113.3 91.411.5 - 78.215.5 %   Platelets 304 150 - 400 K/uL   nRBC 0.3 (H) 0.0 - 0.2 %  Type and screen [facility]   Collection Time: 04/08/20  4:48 AM  Result Value Ref Range   ABO/RH(D) AB POS    Antibody Screen NEG    Sample Expiration      04/11/2020,2359 Performed at [facility], [address] [place], [place] [address]     Imaging Studies:    US MFM OB FOLLOW UP  Result Date: 04/05/2020 ----------------------------------------------------------------------  OBSTETRICS REPORT                         (Signed Final 04/05/2020 02:50 pm) ---------------------------------------------------------------------- Patient Info  ID #:        [id number]                          D.O.B.:  1986/02/26 (33 yrs)  Name:        Sally Wiley              Visit Date: 04/05/2020 10:24 am ---------------------------------------------------------------------- Performed By  Attending:  Corenthian Booker      Ref. Address:      [address]                     [place]                                                               [place], [place]                                                               [address]  Performed By:      Ellin Saba        Secondary Phy.:    [facility]                     RDMS                                      Care  Referred By:       [facility] for      Location[facility] ---------------------------------------------------------------------- Orders  #   Description                          Code         Ordered By  1   [place] MFM OB FOLLOW UP                  [phone]     Wynelle Bourgeois ----------------------------------------------------------------------  #   Order #                    Accession #                 Episode #  1   [id number]                   [id number]                  [id number] ---------------------------------------------------------------------- Indications  Premature rupture of membranes - leaking fluid  O42.90  [redacted] weeks gestation of pregnancy                 Z3A.29  Poor obstetric history: Previous midtrimester   O09.299  loss (22 week PROM, PTD)  Cervical cerclage suture present, third         O34.33  trimester  Cervical insufficiency,                         O34.32  Cervical incompetence,  third trimester          O34.33 ---------------------------------------------------------------------- Fetal Evaluation  Num Of Fetuses:          1  Fetal Heart Rate(bpm):   142  Cardiac Activity:        Observed  Presentation:            Cephalic  Placenta:                Anterior  P. Cord Insertion:       Previously Visualized  Amniotic Fluid  AFI FV:      Subjectively decreased  AFI Sum(cm)     %Tile       Largest Pocket(cm)  7               < 3         4.7  RUQ(cm)                      LUQ(cm)        LLQ(cm)  0.7                          4.7            1.6 ---------------------------------------------------------------------- Biometry  BPD:      72.8   mm     G. Age:  29w 1d         27  %    CI:         73.45  %    70 - 86                                                           FL/HC:       21.9  %    19.2 - 21.4  HC:      269.9   mm     G. Age:  29w 3d         14  %    HC/AC:       1.06       0.99 - 1.21  AC:        254   mm     G. Age:  29w 4d         45  %    FL/BPD:      81.3  %    71 - 87  FL:       59.2   mm     G. Age:  30w 6d         72  %    FL/AC:       23.3  %    20 - 24  HUM:      50.6   mm     G. Age:  29w 5d         50  %  Est. FW:    1485   gm      3 lb 4 oz     51  % ---------------------------------------------------------------------- OB History  Blood Type:    B+  Gravidity:     2         Term:  0          Prem:  1  SAB:   0  TOP:           0       Ectopic: 0         Living: 1  ---------------------------------------------------------------------- Gestational Age  LMP:            27w 5d       Date:  09/24/19                   EDD:  06/30/20  Clinical EDD:   29w 4d                                         EDD:  06/17/20  U/S Today:      29w 5d                                         EDD:  06/16/20  Best:           29w 4d    Det. By:  Clinical EDD               EDD:  06/17/20 ---------------------------------------------------------------------- Anatomy  Cranium:                Previously seen        Aortic Arch:            Previously seen  Cavum:                  Previously seen        Ductal Arch:            Previously seen  Ventricles:             Previously seen        Diaphragm:              Appears normal  Choroid Plexus:         Previously seen        Stomach:                Appears normal, left                                                                         sided  Cerebellum:             Previously seen        Abdomen:                Previously seen  Posterior Fossa:        Previously seen        Abdominal Wall:         Previously seen  Nuchal Fold:            Previously seen        Cord Vessels:           Appears normal (3  vessel cord)  Face:                   Orbits and profile     Kidneys:                Appear normal                          previously seen  Lips:                   Previously seen        Bladder:                Appears normal  Thoracic:               Previously seen        Spine:                  Previously seen  Heart:                  Appears normal         Upper Extremities:      Previously seen                          (4CH, axis, and situs)  RVOT:                   Appears normal         Lower Extremities:      Previously seen  LVOT:                   Appears normal  Other:   Female gender. Nasal bone prev. visualized. Open hands prev.visualized.           Heels and 5th digit  prev.visualized. Technically difficult due to fetal           position. ---------------------------------------------------------------------- Cervix Uterus Adnexa  Cervix  Not visualized (advanced GA >24wks)  Right Ovary  Within normal limits.  Left Ovary  Within normal limits. ---------------------------------------------------------------------- Impression  Follow up growth performed due to premature rupture of  membranes  Normal interval growth with measurements consistent with  dates.  There is good fetal movement.  Subjectively low fluid consistent with oligohydramnios however  objectivelye they is and MVP > 2 and an AFI > 5 cm ---------------------------------------------------------------------- Recommendations  Folllow up per in patient provider. ----------------------------------------------------------------------               Sander Nephew, MD Electronically Signed Final Report   04/05/2020 02:50 pm ----------------------------------------------------------------------    Medications:  Scheduled  amoxicillin  500 mg Oral TID   docusate sodium  100 mg Oral Daily   prenatal multivitamin  1 tablet Oral Q1200   I have reviewed the patient's current medications.  ASSESSMENT: G2P0100 [redacted]w[redacted]d Estimated Date of Delivery: 06/17/20  Patient Active Problem List   Diagnosis Date Noted   Preterm premature rupture of membranes (PPROM) with unknown onset of labor 04/05/2020   Anemia, antepartum 04/03/2020   Cervical cerclage suture present 12/16/2019   Supervision of other normal pregnancy, antepartum 11/25/2019   Short interval between pregnancies affecting pregnancy, antepartum 11/25/2019   Cervical laceration 11/25/2019   Cervical incompetence 08/12/2019   Trichomonal infection 83/15/1761    PLAN: >Cephalic >Cerclage in place >S/P BMZ >S/P Magnesium prophylaxis >continue latency antibiotics >Delivery for fetal indications, labor or 34 weeks  Pt is  aware and agrees with  the current treatment plan  Lazaro Arms 04/08/2020,6:54 AM

## 2020-04-09 NOTE — Consult Note (Signed)
Neonatology Consult  Note:  At the request of the patients obstetrician Dr. Elonda Husky I met with Sally Wiley who is a 34 y.o. G2P0100 with an Estimated Date of Delivery: 06/17/20.  She is currently 81w1dand was admitted for PROM, with a cerclage in place.  She is now s/p BMZ and Magnesium prophylaxis.  Continues on latency antibiotics.  Plan for delivery at 34 weeks.  We reviewed initial delivery room management, including CPAP, [place], and low but certainly possible need for intubation for surfactant administration.  We discussed feeding immaturity and need for full po intake with multiple days of good weight gain and no apnea or bradycardia before discharge.  We reviewed increased risk of jaundice, infection, and temperature instability.   Discussed likely length of stay.  Thank you for allowing [place] participate in her care.  Please call with questions.  BHiginio Roger DO  Neonatologist  The total length of face-to-face or floor / unit time for this encounter was 20 minutes.  Counseling and / or coordination of care was greater than fifty percent of the time.

## 2020-04-09 NOTE — Progress Notes (Signed)
MD notified of prolonged deceleration that occurred at 2135, as well as variable that occurred at 2151.   No new orders at this time.

## 2020-04-09 NOTE — Progress Notes (Signed)
Patient ID: [name], female   DOB: [birth year], 34 y.o.   MRN: [id number] FACULTY PRACTICE ANTEPARTUM(COMPREHENSIVE) NOTE  Sally Wiley is a 34 y.o. G2P0100 with Estimated Date of Delivery: 06/17/20   By   [redacted]w[redacted]d  who is admitted for PROM.  W/cerclage in place  Fetal presentation is cephalic. Length of Stay:  4  Days  Date of admission:04/05/2020  Subjective: No complaints Patient reports the fetal movement as active. Patient reports uterine contraction  activity as rarely Patient reports  vaginal bleeding as none. Patient describes fluid per vagina as clear  Vitals:  Blood pressure (!) 104/59, pulse 96, temperature 98.6 F (37 C), temperature source Oral, resp. rate 18, height 5\' 3"  (1.6 m), weight 60.3 kg, last menstrual period 09/24/2019, SpO2 98 %, unknown if currently breastfeeding. Vitals:   04/08/20 0745 04/08/20 1217 04/08/20 1606 04/08/20 1930  BP: 107/67 113/62 116/66 (!) 104/59  Pulse: 78 98 88 96  Resp: 17 18 18 18   Temp: 98.4 F (36.9 C) 98.2 F (36.8 C) 98.4 F (36.9 C) 98.6 F (37 C)  TempSrc: Oral Oral Oral Oral  SpO2: 99% 100% 100% 98%  Weight:      Height:       Physical Examination:  General appearance - alert, well appearing, and in no distress Abdomen - soft, nontender, nondistended, no masses or organomegaly Fundal Height:  size equals dates Pelvic Exam:  examination not indicated Cervical Exam: Not evaluated. . Extremities: extremities normal, atraumatic, no cyanosis or edema with DTRs 2+ bilaterally Membranes:ruptured, clear fluid  Fetal Monitoring:  Baseline: 150 bpm, Variability: Good {> 6 bpm), Accelerations: Reactive and Decelerations: Absent   reactive  Labs:  No results found for this or any previous visit (from the past 24 hour(s)).  Imaging Studies:    06/08/20 MFM OB FOLLOW UP  Result Date: 04/05/2020 ----------------------------------------------------------------------  OBSTETRICS REPORT                         (Signed Final  04/05/2020 02:50 pm) ---------------------------------------------------------------------- Patient Info  ID #:        06/05/2020                          D.O.B.:  1986-03-15 (34 yrs)  Name:        [id number]              Visit Date: 04/05/2020 10:24 am ---------------------------------------------------------------------- Performed By  Attending:         Joelene Millin      Ref. Address:      [address]                     MD                                                               [place], [address]  [address]  Performed By:      Felecia Jan        Secondary Phy.:    [facility]                     RDMS                                      Care  Referred By:       [facility]      [facility]          [facility]                     [place] ---------------------------------------------------------------------- Orders  #   Description                          Code         Ordered By  1   [place] MFM OB FOLLOW UP                  [phone]     Hansel Feinstein ----------------------------------------------------------------------  #   Order #                    Accession #                 Episode #  1   [id number]                  [id number]                  [id number] ---------------------------------------------------------------------- Indications  Premature rupture of membranes - leaking fluid  O42.90  [redacted] weeks gestation of pregnancy                 Z3A.29  Poor obstetric history: Previous midtrimester   O09.299  loss (22 week PROM, PTD)  Cervical cerclage suture present, third         O34.33  trimester  Cervical insufficiency,                         O34.32  Cervical incompetence, third trimester          O34.33 ---------------------------------------------------------------------- Fetal Evaluation  Num Of Fetuses:          1  Fetal Heart Rate(bpm):   142  Cardiac Activity:         Observed  Presentation:            Cephalic  Placenta:                Anterior  P. Cord Insertion:       Previously Visualized  Amniotic Fluid  AFI FV:      Subjectively decreased  AFI Sum(cm)     %Tile       Largest Pocket(cm)  7               < 3         4.7  RUQ(cm)  LUQ(cm)        LLQ(cm)  0.7                          4.7            1.6 ---------------------------------------------------------------------- Biometry  BPD:      72.8   mm     G. Age:  29w 1d         27  %    CI:         73.45  %    70 - 86                                                           FL/HC:       21.9  %    19.2 - 21.4  HC:      269.9   mm     G. Age:  29w 3d         14  %    HC/AC:       1.06       0.99 - 1.21  AC:        254   mm     G. Age:  29w 4d         45  %    FL/BPD:      81.3  %    71 - 87  FL:       59.2   mm     G. Age:  30w 6d         72  %    FL/AC:       23.3  %    20 - 24  HUM:      50.6   mm     G. Age:  29w 5d         50  %  Est. FW:    1485   gm      3 lb 4 oz     51  % ---------------------------------------------------------------------- OB History  Blood Type:    B+  Gravidity:     2         Term:  0          Prem:  1        SAB:   0  TOP:           0       Ectopic: 0         Living: 1 ---------------------------------------------------------------------- Gestational Age  LMP:            27w 5d       Date:  09/24/19                   EDD:  06/30/20  Clinical EDD:   29w 4d                                         EDD:  06/17/20  U/S Today:      29w 5d  EDD:  06/16/20  Best:           29w 4d    Det. By:  Clinical EDD               EDD:  06/17/20 ---------------------------------------------------------------------- Anatomy  Cranium:                Previously seen        Aortic Arch:            Previously seen  Cavum:                  Previously seen        Ductal Arch:            Previously seen  Ventricles:             Previously seen        Diaphragm:               Appears normal  Choroid Plexus:         Previously seen        Stomach:                Appears normal, left                                                                         sided  Cerebellum:             Previously seen        Abdomen:                Previously seen  Posterior Fossa:        Previously seen        Abdominal Wall:         Previously seen  Nuchal Fold:            Previously seen        Cord Vessels:           Appears normal (3                                                                         vessel cord)  Face:                   Orbits and profile     Kidneys:                Appear normal                          previously seen  Lips:                   Previously seen        Bladder:                Appears normal  Thoracic:               Previously  seen        Spine:                  Previously seen  Heart:                  Appears normal         Upper Extremities:      Previously seen                          (4CH, axis, and situs)  RVOT:                   Appears normal         Lower Extremities:      Previously seen  LVOT:                   Appears normal  Other:   Female gender. Nasal bone prev. visualized. Open hands prev.visualized.           Heels and 5th digit prev.visualized. Technically difficult due to fetal           position. ---------------------------------------------------------------------- Cervix Uterus Adnexa  Cervix  Not visualized (advanced GA >24wks)  Right Ovary  Within normal limits.  Left Ovary  Within normal limits. ---------------------------------------------------------------------- Impression  Follow up growth performed due to premature rupture of  membranes  Normal interval growth with measurements consistent with  dates.  There is good fetal movement.  Subjectively low fluid consistent with oligohydramnios however  objectivelye they is and MVP > 2 and an AFI > 5 cm ---------------------------------------------------------------------- Recommendations   Folllow up per in patient provider. ----------------------------------------------------------------------               Lin Landsman, MD Electronically Signed Final Report   04/05/2020 02:50 pm ----------------------------------------------------------------------    Medications:  Scheduled . amoxicillin  500 mg Oral TID  . docusate sodium  100 mg Oral Daily  . prenatal multivitamin  1 tablet Oral Q1200   I have reviewed the patient's current medications.  ASSESSMENT: G2P0100 [redacted]w[redacted]d Estimated Date of Delivery: 06/17/20  Patient Active Problem List   Diagnosis Date Noted  . Preterm premature rupture of membranes (PPROM) with unknown onset of labor 04/05/2020  . Anemia, antepartum 04/03/2020  . Cervical cerclage suture present 12/16/2019  . Supervision of other normal pregnancy, antepartum 11/25/2019  . Short interval between pregnancies affecting pregnancy, antepartum 11/25/2019  . Cervical laceration 11/25/2019  . Cervical incompetence 08/12/2019  . Trichomonal infection 05/04/2019    PLAN: No interval change in management plan since 04/08/20  >Cephalic >Cerclage in place >S/P BMZ >S/P Magnesium prophylaxis >continue latency antibiotics >Delivery for fetal indications, labor or 34 weeks  Pt is aware and agrees with the current treatment plan   Lazaro Arms 04/09/2020,7:30 AM

## 2020-04-10 ENCOUNTER — Ambulatory Visit: Payer: BLUE CROSS/BLUE SHIELD

## 2020-04-10 MED ORDER — SODIUM CHLORIDE 0.9% FLUSH
3.0000 mL | Freq: Two times a day (BID) | INTRAVENOUS | Status: DC
  Administered 2020-04-10 – 2020-04-16 (×13): 3 mL via INTRAVENOUS

## 2020-04-10 NOTE — Plan of Care (Signed)
  Problem: Pain Managment: Goal: General experience of comfort will improve Outcome: Progressing   

## 2020-04-10 NOTE — Progress Notes (Signed)
Patient ID: Sally Wiley, female   DOB: 10-Feb-1986, 34 y.o.   MRN: [id number] FACULTY PRACTICE ANTEPARTUM(COMPREHENSIVE) NOTE  Sally Wiley is a 34 y.o. G2P0100 with Estimated Date of Delivery: 06/17/20   By  LMP, early ultrasound [redacted]w[redacted]d  who is admitted for PROM.    Fetal presentation is cephalic. Length of Stay:  5  Days  Date of admission:04/05/2020  Subjective: No complaints Patient reports the fetal movement as active. Patient reports uterine contraction  activity as none. Patient reports  vaginal bleeding as none. Patient describes fluid per vagina as None.  Vitals:  Blood pressure (!) 109/59, pulse 89, temperature 98.4 F (36.9 C), temperature source Oral, resp. rate 18, height 5\' 3"  (1.6 m), weight 60.3 kg, last menstrual period 09/24/2019, SpO2 99 %, unknown if currently breastfeeding. Vitals:   04/09/20 1554 04/09/20 1555 04/09/20 1927 04/09/20 2315  BP: 117/65  108/69 (!) 109/59  Pulse: (!) 106  91 89  Resp: 18  18 18   Temp: 98.4 F (36.9 C)  98.5 F (36.9 C) 98.4 F (36.9 C)  TempSrc: Oral  Oral Oral  SpO2: 99% 97% 98% 99%  Weight:      Height:       Physical Examination:  General appearance - alert, well appearing, and in no distress Abdomen - soft, nontender, nondistended, no masses or organomegaly Fundal Height:  size equals dates Pelvic Exam:  examination not indicated Cervical Exam: Not evaluated. . Extremities: extremities normal, atraumatic, no cyanosis or edema with DTRs 2+ bilaterally Membranes:ruptured, clear fluid  Fetal Monitoring:  Baseline: 105s bpm, Variability: Good {> 6 bpm), Accelerations: Reactive and Decelerations: Absent   reactive  Labs:  No results found for this or any previous visit (from the past 24 hour(s)).  Imaging Studies:    No results found.   Medications:  Scheduled . amoxicillin  500 mg Oral TID  . docusate sodium  100 mg Oral Daily  . prenatal multivitamin  1 tablet Oral Q1200   I have reviewed the patient's  current medications.  ASSESSMENT: G2P0100 [redacted]w[redacted]d Estimated Date of Delivery: 06/17/20  Patient Active Problem List   Diagnosis Date Noted  . Preterm premature rupture of membranes (PPROM) with unknown onset of labor 04/05/2020  . Anemia, antepartum 04/03/2020  . Cervical cerclage suture present 12/16/2019  . Supervision of other normal pregnancy, antepartum 11/25/2019  . Short interval between pregnancies affecting pregnancy, antepartum 11/25/2019  . Cervical laceration 11/25/2019  . Cervical incompetence 08/12/2019  . Trichomonal infection 05/04/2019    PLAN:  No interval change in management plan since 04/08/20  >Cephalic >Cerclage in place >S/P BMZ >S/P Magnesium prophylaxis >continue latency antibiotics >Delivery for fetal indications, labor or 34 weeks  Pt is aware and agrees with the current treatment plan   07/05/2019 04/10/2020,7:14 AM

## 2020-04-11 LAB — CBC
HCT: 32.7 % — ABNORMAL LOW (ref 36.0–46.0)
Hemoglobin: 10.8 g/dL — ABNORMAL LOW (ref 12.0–15.0)
MCH: 30.3 pg (ref 26.0–34.0)
MCHC: 33 g/dL (ref 30.0–36.0)
MCV: 91.6 fL (ref 80.0–100.0)
Platelets: 314 10*3/uL (ref 150–400)
RBC: 3.57 MIL/uL — ABNORMAL LOW (ref 3.87–5.11)
RDW: 13.5 % (ref 11.5–15.5)
WBC: 19.6 10*3/uL — ABNORMAL HIGH (ref 4.0–10.5)
nRBC: 0.1 % (ref 0.0–0.2)

## 2020-04-11 LAB — TYPE AND SCREEN
ABO/RH(D): AB POS
Antibody Screen: NEGATIVE

## 2020-04-11 NOTE — Progress Notes (Signed)
Patient ID: Sally Wiley, female   DOB: 1986-04-29, 34 y.o.   MRN: [id number] FACULTY PRACTICE ANTEPARTUM(COMPREHENSIVE) NOTE  Sally Wiley is a 34 y.o. G2P0100 with Estimated Date of Delivery: 06/17/20   By  early ultrasound [redacted]w[redacted]d  who is admitted for PROM.    Fetal presentation is cephalic. Length of Stay:  6  Days  Date of admission:04/05/2020  Subjective: No complaints Patient reports the fetal movement as active. Patient reports uterine contraction  activity as none. Patient reports  vaginal bleeding as none. Patient describes fluid per vagina as None.  Vitals:  Blood pressure 101/63, pulse (!) 101, temperature 98.3 F (36.8 C), temperature source Oral, resp. rate 18, height 5\' 3"  (1.6 m), weight 60.3 kg, last menstrual period 09/24/2019, SpO2 98 %, unknown if currently breastfeeding. Vitals:   04/10/20 2019 04/10/20 2020 04/10/20 2300 04/11/20 0550  BP: 116/71  114/62 101/63  Pulse: (!) 106  (!) 105 (!) 101  Resp: 18  18 18   Temp: 98.4 F (36.9 C)  98.4 F (36.9 C) 98.3 F (36.8 C)  TempSrc: Oral  Oral Oral  SpO2: 100% 98% 98% 98%  Weight:      Height:       Physical Examination:  General appearance - alert, well appearing, and in no distress Abdomen - soft, nontender, nondistended, no masses or organomegaly Fundal Height:  size equals dates Pelvic Exam:  examination not indicated Cervical Exam: Not evaluated. . Extremities: extremities normal, atraumatic, no cyanosis or edema with DTRs 2+ bilaterally Membranes:ruptured, clear fluid  Fetal Monitoring:  Baseline: 140s-150s bpm, Variability: Good {> 6 bpm), Accelerations: Reactive and Decelerations: Absent   reactive  Labs:  Results for orders placed or performed during the hospital encounter of 04/05/20 (from the past 24 hour(s))  CBC   Collection Time: 04/11/20  5:19 AM  Result Value Ref Range   WBC 19.6 (H) 4.0 - 10.5 K/uL   RBC 3.57 (L) 3.87 - 5.11 MIL/uL   Hemoglobin 10.8 (L) 12.0 - 15.0 g/dL   HCT  06/05/20 (L) 36 - 46 %   MCV 91.6 80.0 - 100.0 fL   MCH 30.3 26.0 - 34.0 pg   MCHC 33.0 30.0 - 36.0 g/dL   RDW 04/13/20 79.8 - 92.1 %   Platelets 314 150 - 400 K/uL   nRBC 0.1 0.0 - 0.2 %  Type and screen [facility]   Collection Time: 04/11/20  5:19 AM  Result Value Ref Range   ABO/RH(D) AB POS    Antibody Screen NEG    Sample Expiration      04/14/2020,2359 Performed at [facility], [address], [place], [address] [place]     Imaging Studies:    No results found.   Medications:  Scheduled . amoxicillin  500 mg Oral TID  . docusate sodium  100 mg Oral Daily  . prenatal multivitamin  1 tablet Oral Q1200  . sodium chloride flush  3 mL Intravenous Q12H   I have reviewed the patient's current medications.  ASSESSMENT: G2P0100 [redacted]w[redacted]d Estimated Date of Delivery: 06/17/20  Patient Active Problem List   Diagnosis Date Noted  . Preterm premature rupture of membranes (PPROM) with unknown onset of labor 04/05/2020  . Anemia, antepartum 04/03/2020  . Cervical cerclage suture present 12/16/2019  . Supervision of other normal pregnancy, antepartum 11/25/2019  . Short interval between pregnancies affecting pregnancy, antepartum 11/25/2019  . Cervical laceration 11/25/2019  . Cervical incompetence 08/12/2019  . Trichomonal infection  05/04/2019    PLAN: No change in management plan from 7/54/32    >Cephalic >Cerclage in place >S/P BMZ >S/P Magnesium prophylaxis >continue latency antibiotics, finishes up tonight at 2200 >Delivery for fetal indications, labor or 34 weeks  Pt is aware and agrees with the current treatment plan    Florian Buff 04/11/2020,7:19 AM

## 2020-04-12 DIAGNOSIS — O3433 Maternal care for cervical incompetence, third trimester: Secondary | ICD-10-CM

## 2020-04-12 DIAGNOSIS — Z3A30 30 weeks gestation of pregnancy: Secondary | ICD-10-CM

## 2020-04-12 DIAGNOSIS — O42113 Preterm premature rupture of membranes, onset of labor more than 24 hours following rupture, third trimester: Secondary | ICD-10-CM

## 2020-04-12 NOTE — Progress Notes (Signed)
Initial Nutrition Assessment  DOCUMENTATION CODES:   Not applicable  INTERVENTION:  Regular diet May order double protein portions, snacks TID   NUTRITION DIAGNOSIS:  Increased nutrient needs related to  (pregancy and fetal growth requirements) as evidenced by  (30 week IUP).  GOAL:  Patient will meet greater than or equal to 90% of their needs, Weight gain  MONITOR:  Weight trends  REASON FOR ASSESSMENT:  Antenatal, LOS   ASSESSMENT:  30 4/4 weeks, adm with PROM, cerclage. Wt at initial prenatal visit 115 lbs/ 52.16 kg ( 10 weeks IUP ) BMI 20.4.  18 lb weight gain to date. [place] planned for 34 weeks  Diet Order:   Diet Order            Diet regular Room service appropriate? Yes; Fluid consistency: Thin  Diet effective now                 EDUCATION NEEDS:   No education needs have been identified at this time  Skin:  Skin Assessment: Reviewed RN Assessment   Height:   Ht Readings from Last 1 Encounters:  04/05/20 5\' 3"  (1.6 m)    Weight:   Wt Readings from Last 1 Encounters:  04/05/20 60.3 kg    Ideal Body Weight:   115 lbs  BMI:  Body mass index is 23.56 kg/m.  Estimated Nutritional Needs:   Kcal:  1700-1900  Protein:  75-85 g  Fluid:  2 L    06/05/20 M.Elisabeth Cara LDN Neonatal Nutrition Support Specialist/RD III

## 2020-04-12 NOTE — Progress Notes (Signed)
Patient ID: TEJA COSTEN, female   DOB: 04/29/1986, 34 y.o.   MRN: [id number] FACULTY PRACTICE ANTEPARTUM(COMPREHENSIVE) NOTE  Sally Wiley is a 34 y.o. G2P0100 with Estimated Date of Delivery: 06/17/20   By  early ultrasound [redacted]w[redacted]d  who is admitted for PROM.    Fetal presentation is cephalic. Length of Stay:  7  Days  Date of admission:04/05/2020  Subjective: No complaints Patient reports the fetal movement as active. Patient reports uterine contraction  activity as none. Patient reports  vaginal bleeding as none. Patient describes fluid per vagina as None.  Vitals:  Blood pressure 118/71, pulse 99, temperature 98.3 F (36.8 C), temperature source Oral, resp. rate 20, height 5\' 3"  (1.6 m), weight 60.3 kg, last menstrual period 09/24/2019, SpO2 100 %, unknown if currently breastfeeding. Vitals:   04/11/20 1927 04/11/20 2215 04/12/20 0801 04/12/20 1145  BP: 123/68 115/68 113/74 118/71  Pulse: 91 93 84 99  Resp: 18 17 18 20   Temp: 98.5 F (36.9 C) 98.4 F (36.9 C) 97.9 F (36.6 C) 98.3 F (36.8 C)  TempSrc: Oral Oral Oral Oral  SpO2: 98% 99% 100% 100%  Weight:      Height:       Physical Examination:  General appearance - alert, well appearing, and in no distress Abdomen - soft, nontender, nondistended, no masses or organomegaly Fundal Height:  size equals dates Pelvic Exam:  examination not indicated Cervical Exam: Not evaluated. . Extremities: extremities normal, atraumatic, no cyanosis or edema with DTRs 2+ bilaterally Membranes:ruptured, clear fluid  Fetal Monitoring:  Baseline: 140s-150s bpm, Variability: Good {> 6 bpm), Accelerations: Reactive and Decelerations: Absent   reactive  Labs:  No results found for this or any previous visit (from the past 24 hour(s)).  Imaging Studies:    No results found.   Medications:  Scheduled . docusate sodium  100 mg Oral Daily  . prenatal multivitamin  1 tablet Oral Q1200  . sodium chloride flush  3 mL Intravenous Q12H    I have reviewed the patient's current medications.  ASSESSMENT: G2P0100 [redacted]w[redacted]d Estimated Date of Delivery: 06/17/20  Patient Active Problem List   Diagnosis Date Noted  . Preterm premature rupture of membranes (PPROM) with unknown onset of labor 04/05/2020  . Anemia, antepartum 04/03/2020  . Cervical cerclage suture present 12/16/2019  . Supervision of other normal pregnancy, antepartum 11/25/2019  . Short interval between pregnancies affecting pregnancy, antepartum 11/25/2019  . Cervical laceration 11/25/2019  . Cervical incompetence 08/12/2019  . Trichomonal infection 05/04/2019    PLAN: No change in management plan from 04/11/20  >Cephalic >Cerclage in place >S/P BMZ >S/P Magnesium prophylaxis >continue latency antibiotics >Delivery for fetal indications, labor or 34 weeks  Pt is aware and agrees with the current treatment plan   07/05/2019 04/12/2020,12:27 PM

## 2020-04-13 NOTE — Progress Notes (Signed)
Patient ID: Sally Wiley, female   DOB: [birth year], 34 y.o.   MRN: [id number] FACULTY PRACTICE ANTEPARTUM(COMPREHENSIVE) NOTE  Sally Wiley is a 34 y.o. G2P0100 at [redacted]w[redacted]d by best clinical estimate who is admitted for PROM.   Fetal presentation is cephalic. Length of Stay:  8  Days  Subjective: Rare contractions Patient reports the fetal movement as active. Patient reports uterine contraction  activity as rare Patient reports  vaginal bleeding as none. Patient describes fluid per vagina as Clear.  Vitals:  Blood pressure 126/79, pulse (!) 125, temperature 99.3 F (37.4 C), temperature source Oral, resp. rate 18, height 5\' 3"  (1.6 m), weight 60.3 kg, last menstrual period 09/24/2019, SpO2 99 %, unknown if currently breastfeeding. Physical Examination:  General appearance - alert, well appearing, and in no distress Heart - normal rate and regular rhythm Abdomen - soft, nontender, nondistended Fundal Height:  size equals dates Cervical Exam: Not evaluated.  Extremities: extremities normal, atraumatic, no cyanosis or edema and Homans sign is negative, no sign of DVT Membranes:ruptured, clear fluid  Fetal Monitoring:   Fetal Heart Rate A  Mode External filed at 04/13/2020 0812  Baseline Rate (A) 145 bpm filed at 04/12/2020 2015  Variability 6-25 BPM filed at 04/12/2020 2015  Accelerations 15 x 15 filed at 04/12/2020 2015  Decelerations None filed at 04/12/2020 2015  Multiple birth? N filed at 04/07/2020 2206     Labs:  No results found for this or any previous visit (from the past 24 hour(s)).   Medications:  Scheduled . docusate sodium  100 mg Oral Daily  . prenatal multivitamin  1 tablet Oral Q1200  . sodium chloride flush  3 mL Intravenous Q12H   I have reviewed the patient's current medications.  ASSESSMENT: Patient Active Problem List   Diagnosis Date Noted  . Preterm premature rupture of membranes (PPROM) with unknown onset of labor 04/05/2020  . Anemia,  antepartum 04/03/2020  . Cervical cerclage suture present 12/16/2019  . Supervision of other normal pregnancy, antepartum 11/25/2019  . Short interval between pregnancies affecting pregnancy, antepartum 11/25/2019  . Cervical laceration 11/25/2019  . Cervical incompetence 08/12/2019  . Trichomonal infection 05/04/2019    PLAN: Continue to monitor for s/sx of infection, PTL fetal distress  07/05/2019 04/13/2020,10:07 AM

## 2020-04-14 NOTE — Plan of Care (Signed)
  Problem: Pain Managment: Goal: General experience of comfort will improve Outcome: Progressing   

## 2020-04-14 NOTE — Progress Notes (Signed)
Patient ID: Sally Wiley, female   DOB: 03-Nov-1985, 34 y.o.   MRN: [id number] FACULTY PRACTICE ANTEPARTUM(COMPREHENSIVE) NOTE  ZAELYNN FUCHS is a 34 y.o. G2P0100 at 106w6d by early ultrasound who is admitted for rupture of membranes.   Fetal presentation is cephalic. Length of Stay:  9  Days  Subjective:  Patient reports the fetal movement as active. Patient reports uterine contraction  activity as none. Patient reports  vaginal bleeding as none. Patient describes fluid per vagina as Clear.  Vitals:  Blood pressure 112/75, pulse (!) 108, temperature 98.3 F (36.8 C), temperature source Oral, resp. rate 18, height 5\' 3"  (1.6 m), weight 60.3 kg, last menstrual period 09/24/2019, SpO2 100 %, unknown if currently breastfeeding. Physical Examination:  General appearance - alert, well appearing, and in no distress Heart - normal rate and regular rhythm Abdomen - soft, nontender, nondistended Fundal Height:  size equals dates Cervical Exam: Not evaluated. Extremities: extremities normal, atraumatic, no cyanosis or edema and Homans sign is negative, no sign of DVT Membranes:ruptured  Fetal Monitoring:     Fetal Heart Rate A  Mode External filed at 04/14/2020 0914  Baseline Rate (A) 140 bpm filed at 04/13/2020 2201  Variability 6-25 BPM filed at 04/13/2020 2201  Accelerations 15 x 15 filed at 04/13/2020 2201  Decelerations None filed at 04/13/2020 2201     Labs:  No results found for this or any previous visit (from the past 24 hour(s)).   Medications:  Scheduled . docusate sodium  100 mg Oral Daily  . prenatal multivitamin  1 tablet Oral Q1200  . sodium chloride flush  3 mL Intravenous Q12H   I have reviewed the patient's current medications.  ASSESSMENT: Patient Active Problem List   Diagnosis Date Noted  . Preterm premature rupture of membranes (PPROM) with unknown onset of labor 04/05/2020  . Anemia, antepartum 04/03/2020  . Cervical cerclage suture present  12/16/2019  . Supervision of other normal pregnancy, antepartum 11/25/2019  . Short interval between pregnancies affecting pregnancy, antepartum 11/25/2019  . Cervical laceration 11/25/2019  . Cervical incompetence 08/12/2019  . Trichomonal infection 05/04/2019    PLAN: Continue current management inpatient for PPROM  07/05/2019 04/14/2020,10:18 AM

## 2020-04-14 NOTE — Progress Notes (Signed)
Patient ID: Sally Wiley, female   DOB: 05-30-1986, 34 y.o.   MRN: [id number] FACULTY PRACTICE ANTEPARTUM(COMPREHENSIVE) NOTE  Sally Wiley is a 34 y.o. G2P0100 at [redacted]w[redacted]d by best clinical estimate who is admitted for PROM.   Fetal presentation is cephalic. Length of Stay:  9  Days  ASSESSMENT: Active Problems:   Preterm premature rupture of membranes (PPROM) with unknown onset of labor   PLAN: Inpt. Monitoring  Delivery for s/sx's of chorioamnionitis Stable maternal/fetal unit  Subjective: Feels well. Patient reports the fetal movement as active. Patient reports uterine contraction  activity as none. Patient reports  vaginal bleeding as none. Patient describes fluid per vagina as Clear.  Vitals:  Blood pressure 108/64, pulse 97, temperature 98.4 F (36.9 C), temperature source Oral, resp. rate 17, height 5\' 3"  (1.6 m), weight 60.3 kg, last menstrual period 09/24/2019, SpO2 99 %, unknown if currently breastfeeding. Physical Examination:  General appearance - alert, well appearing, and in no distress Chest - normal effort Abdomen - gravid, non-tender Fundal Height:  size equals dates Extremities: Homans sign is negative, no sign of DVT  Membranes:ruptured, clear fluid  Fetal Monitoring:  Baseline: 150 bpm, Variability: Good {> 6 bpm), Accelerations: Reactive and Decelerations: Absent  Medications:  Scheduled  docusate sodium  100 mg Oral Daily   prenatal multivitamin  1 tablet Oral Q1200   sodium chloride flush  3 mL Intravenous Q12H   I have reviewed the patient's current medications.   09/26/2019, MD 04/14/2020,8:01 AM

## 2020-04-15 NOTE — Progress Notes (Signed)
FACULTY PRACTICE ANTEPARTUM(COMPREHENSIVE) NOTE  Sally Wiley is a 34 y.o. G2P0100 at [redacted]w[redacted]d  who is admitted for rupture of membranes, PROM.   With a cerclage in place.  Fetal presentation is cephalic. By u/s 6................................................................................................................................................................................................................................................................................................................................................................................................................................................................................................................................................................................................................................................................................................................................................................................................................................................................................................Marland Kitchen Length of Stay:  10  Days  Subjective: Resting comfortbly Patient reports the fetal movement as active. Patient reports uterine contraction  activity as irregular, every 90 minutes. Patient reports  vaginal bleeding as none. Patient describes fluid per vagina as None.  Vitals:  Blood pressure 112/71, pulse 99, temperature 98.3 F (36.8 C), temperature source Oral, resp. rate 16, height 5\' 3"  (1.6 m), weight 60.3 kg, last menstrual period 09/24/2019, SpO2 99 %, unknown if currently breastfeeding. Physical Examination:  General appearance - alert, well appearing, and in no distress and oriented to person, place, and time Heart - normal rate and regular rhythm Abdomen - soft, nontender, nondistended Fundal Height:  size equals dates Cervical Exam: Not evaluated. and found to be / / and fetal presentation is . Extremities: extremities  normal, atraumatic, no cyanosis or edema and Homans sign is negative, no sign of DVT with DTRs 2+ bilaterally Membranes:ruptured  Fetal Monitoring:    Labs:  Results for orders placed or performed during the hospital encounter of 04/05/20 (from the past 24 hour(s))  Type and screen [facility]   Collection Time: 04/14/20  7:35 PM  Result Value Ref Range   ABO/RH(D) AB POS    Antibody Screen NEG    Sample Expiration      04/17/2020,2359 Performed at [facility], [address] [place], [place]     Imaging Studies:     Currently EPIC will not allow sonographic studies to automatically populate into notes.  In the meantime, copy and paste results into note or free text.  Medications:  Scheduled . docusate sodium  100 mg Oral Daily  . prenatal multivitamin  1 tablet Oral Q1200  . sodium chloride flush  3 mL Intravenous Q12H   I have reviewed the patient's current medications.  ASSESSMENT: Patient Active Problem List   Diagnosis Date Noted  . Preterm premature rupture of membranes (PPROM) with unknown onset of labor 04/05/2020  . Anemia, antepartum 04/03/2020  . Cervical cerclage suture present 12/16/2019  . Supervision of other normal pregnancy, antepartum 11/25/2019  . Short interval between pregnancies affecting pregnancy, antepartum 11/25/2019  . Cervical laceration 11/25/2019  . Cervical incompetence 08/12/2019  . Trichomonal infection 05/04/2019    PLAN: Goal: 34 wk stitch removal and delivery 34 wk Or as required for ptl   07/05/2019 04/15/2020,9:30 AM    Patient ID: 04/17/2020, female   DOB: [birth year], 34 y.o.   MRN: [id number]

## 2020-04-16 DIAGNOSIS — Z3A31 31 weeks gestation of pregnancy: Secondary | ICD-10-CM

## 2020-04-16 DIAGNOSIS — O42113 Preterm premature rupture of membranes, onset of labor more than 24 hours following rupture, third trimester: Secondary | ICD-10-CM

## 2020-04-16 LAB — TYPE AND SCREEN
ABO/RH(D): AB POS
ABO/RH(D): AB POS
Antibody Screen: NEGATIVE
Antibody Screen: NEGATIVE

## 2020-04-16 LAB — CBC WITH DIFFERENTIAL/PLATELET
Abs Immature Granulocytes: 0.73 10*3/uL — ABNORMAL HIGH (ref 0.00–0.07)
Basophils Absolute: 0.1 10*3/uL (ref 0.0–0.1)
Basophils Relative: 0 %
Eosinophils Absolute: 0.1 10*3/uL (ref 0.0–0.5)
Eosinophils Relative: 0 %
HCT: 35.3 % — ABNORMAL LOW (ref 36.0–46.0)
Hemoglobin: 11.5 g/dL — ABNORMAL LOW (ref 12.0–15.0)
Immature Granulocytes: 4 %
Lymphocytes Relative: 9 %
Lymphs Abs: 1.7 10*3/uL (ref 0.7–4.0)
MCH: 30.1 pg (ref 26.0–34.0)
MCHC: 32.6 g/dL (ref 30.0–36.0)
MCV: 92.4 fL (ref 80.0–100.0)
Monocytes Absolute: 1.6 10*3/uL — ABNORMAL HIGH (ref 0.1–1.0)
Monocytes Relative: 9 %
Neutro Abs: 14.9 10*3/uL — ABNORMAL HIGH (ref 1.7–7.7)
Neutrophils Relative %: 78 %
Platelets: 306 10*3/uL (ref 150–400)
RBC: 3.82 MIL/uL — ABNORMAL LOW (ref 3.87–5.11)
RDW: 14.3 % (ref 11.5–15.5)
WBC: 19.1 10*3/uL — ABNORMAL HIGH (ref 4.0–10.5)
nRBC: 0 % (ref 0.0–0.2)

## 2020-04-16 MED ORDER — LACTATED RINGERS IV SOLN
500.0000 mL | INTRAVENOUS | Status: DC | PRN
  Administered 2020-04-16: 1000 mL via INTRAVENOUS

## 2020-04-16 MED ORDER — ZOLPIDEM TARTRATE 5 MG PO TABS: 5.0000 mg | ORAL_TABLET | Freq: Every evening | ORAL | Status: DC | PRN

## 2020-04-16 MED ORDER — FENTANYL CITRATE (PF) 100 MCG/2ML IJ SOLN: 100.0000 ug | INTRAMUSCULAR | Status: DC | PRN

## 2020-04-16 MED ORDER — LACTATED RINGERS IV SOLN: INTRAVENOUS | Status: DC

## 2020-04-16 MED ORDER — ACETAMINOPHEN 325 MG PO TABS
650.0000 mg | ORAL_TABLET | ORAL | Status: DC | PRN
  Administered 2020-04-16: 650 mg via ORAL

## 2020-04-16 MED ORDER — LIDOCAINE HCL (PF) 1 % IJ SOLN: 30.0000 mL | INTRAMUSCULAR | Status: DC | PRN

## 2020-04-16 MED ORDER — OXYTOCIN 10 UNIT/ML IJ SOLN: 10.0000 [IU] | Freq: Once | INTRAMUSCULAR | Status: DC | PRN

## 2020-04-16 MED ORDER — OXYTOCIN-SODIUM CHLORIDE 30-0.9 UT/500ML-% IV SOLN: 2.5000 [IU]/h | INTRAVENOUS | Status: DC

## 2020-04-16 MED ORDER — OXYTOCIN BOLUS FROM INFUSION: 333.0000 mL | Freq: Once | INTRAVENOUS | Status: DC

## 2020-04-16 MED ORDER — PENICILLIN G POT IN DEXTROSE 60000 UNIT/ML IV SOLN: 3.0000 10*6.[IU] | INTRAVENOUS | Status: DC

## 2020-04-16 MED ORDER — DOCUSATE SODIUM 100 MG PO CAPS: 100.0000 mg | ORAL_CAPSULE | Freq: Every day | ORAL | Status: DC

## 2020-04-16 MED ORDER — ONDANSETRON HCL 4 MG/2ML IJ SOLN: 4.0000 mg | Freq: Four times a day (QID) | INTRAMUSCULAR | Status: DC | PRN

## 2020-04-16 MED ORDER — OXYCODONE-ACETAMINOPHEN 5-325 MG PO TABS: 1.0000 | ORAL_TABLET | ORAL | Status: DC | PRN

## 2020-04-16 MED ORDER — SODIUM CHLORIDE 0.9 % IV SOLN: 5.0000 10*6.[IU] | Freq: Once | INTRAVENOUS | Status: DC

## 2020-04-16 MED ORDER — HYDROMORPHONE HCL 1 MG/ML IJ SOLN
2.0000 mg | Freq: Once | INTRAMUSCULAR | Status: AC
  Administered 2020-04-16: 2 mg via INTRAVENOUS
  Filled 2020-04-16: qty 2

## 2020-04-16 MED ORDER — CALCIUM CARBONATE ANTACID 500 MG PO CHEW: 2.0000 | CHEWABLE_TABLET | ORAL | Status: DC | PRN

## 2020-04-16 MED ORDER — PRENATAL MULTIVITAMIN CH: 1.0000 | ORAL_TABLET | Freq: Every day | ORAL | Status: DC

## 2020-04-16 MED ORDER — ACETAMINOPHEN 325 MG PO TABS: 650.0000 mg | ORAL_TABLET | ORAL | Status: DC | PRN

## 2020-04-16 MED ORDER — LACTATED RINGERS IV SOLN
INTRAVENOUS | Status: DC
Start: 2020-04-16 — End: 2020-04-16

## 2020-04-16 MED ORDER — SOD CITRATE-CITRIC ACID 500-334 MG/5ML PO SOLN: 30.0000 mL | ORAL | Status: DC | PRN

## 2020-04-16 MED ORDER — OXYCODONE-ACETAMINOPHEN 5-325 MG PO TABS: 2.0000 | ORAL_TABLET | ORAL | Status: DC | PRN

## 2020-04-16 NOTE — Progress Notes (Signed)
Patient ID: Sally Wiley, female   DOB: 10/05/1986, 34 y.o.   MRN: [id number]  [name] is a 34 y.o. G66P0100 female at [redacted]w[redacted]d weeks gestation presenting to L&D from [facility] for IOL d/t suspected chorioamnioitis. CNM was requested by Dr. Alysia Penna to verify presentation.  NST - FHR: 140 bpm / moderate variability / accels present / variable decels present / TOCO: regular every 5-9 mins (every 3 mins per Dr. Alysia Penna)  Patient informed that the ultrasound is considered a limited OB ultrasound and is not intended to be a complete ultrasound exam.  Patient also informed that the ultrasound is not being completed with the intent of assessing for fetal or placental anomalies or any pelvic abnormalities.  Explained that the purpose of todays ultrasound is to assess for presentation.  Baby was found to be in a cephalic presentation. Patient acknowledges the purpose of the exam and the limitations of the study.  Patient desires to have epidural prior to removal of cerclage.  Raelyn Mora, CNM  04/16/2020 10:42 AM

## 2020-04-16 NOTE — Progress Notes (Signed)
Patient ID: Sally Wiley, female   DOB: [birth year], 34 y.o.   MRN: [id number] [facility] Attending.  Since transfer to L & D pt has had only 1 ut ctx and denies any pain now. Will give fluid bolus and check CBC. Will hold on IOL for now. POC reviewed with pt, pt's mother and nursing.

## 2020-04-16 NOTE — Progress Notes (Signed)
Patient ID: Sally Wiley, female   DOB: 14-May-1986, 34 y.o.   MRN: [id number] FACULTY PRACTICE ANTEPARTUM(COMPREHENSIVE) NOTE  Sally Wiley is a 34 y.o. G2P0100 at [redacted]w[redacted]d by early ultrasound who is admitted for rupture of membranes.   Fetal presentation is cephalic. Length of Stay:  11  Days  Subjective: Patient reports some mild suprapubic pain since yesterday evening Patient reports the fetal movement as active. Patient reports uterine contraction  activity as none. Patient reports  vaginal bleeding as none. Patient describes fluid per vagina as Clear.  Vitals:  Blood pressure 107/63, pulse (!) 115, temperature 98.4 F (36.9 C), temperature source Oral, resp. rate 18, height 5\' 3"  (1.6 m), weight 60.3 kg, last menstrual period 09/24/2019, SpO2 99 %, unknown if currently breastfeeding. Physical Examination:  General appearance - alert, well appearing, and in no distress Heart - normal rate and regular rhythm Abdomen - soft, nontender, gravid Fundal Height:  size equals dates Cervical Exam: Not evaluated. Extremities: extremities normal, atraumatic, no cyanosis or edema and Homans sign is negative, no sign of DVT Membranes:ruptured  Fetal Monitoring:   baseline 145, mod variability, +accels, no decels Toco: no contractions    Labs:  No results found for this or any previous visit (from the past 24 hour(s)).   Medications:  Scheduled  docusate sodium  100 mg Oral Daily   prenatal multivitamin  1 tablet Oral Q1200   sodium chloride flush  3 mL Intravenous Q12H   I have reviewed the patient's current medications.  ASSESSMENT: Patient Active Problem List   Diagnosis Date Noted   Preterm premature rupture of membranes (PPROM) with unknown onset of labor 04/05/2020   Anemia, antepartum 04/03/2020   Cervical cerclage suture present 12/16/2019   Supervision of other normal pregnancy, antepartum 11/25/2019   Short interval between pregnancies affecting  pregnancy, antepartum 11/25/2019   Cervical laceration 11/25/2019   Cervical incompetence 08/12/2019   Trichomonal infection 05/04/2019    PLAN: Continue current management inpatient for PPROM Deliver for si/sx of chorioamnionitis or by 34 weeks Patient completed BMZ and latency antibiotics  [place]osa Bohac 04/16/2020,7:43 AM

## 2020-04-16 NOTE — Progress Notes (Signed)
Patient ID: Sally Wiley, female   DOB: 1986/09/01, 34 y.o.   MRN: [id number] CTSP by nursing to see due to ut ctx. Pt reports painful ut ctx since around 2 AM Reports ctx have become more painful. Denies vaginal bleeding. Reports good fetal movement  PE toco ut ctx q 3-5 minutes SVE closed, cerclage in placed, vertex + uterine tenderness  A/P IUP 31 1/7 weeks        PROM        Chorioamnionitis  Will transfer to L & D and proceed toward delivery. Remove cerclage in L & D. L & D and NICU aware.

## 2020-04-16 NOTE — Progress Notes (Signed)
Spoke with Welford Roche, RN, labor and delivery charge nurse, re transfer to labor and delivery for augmentation of labor. Shanda Bumps will call me shortly when a nurse is available to care for patient upon transfer.

## 2020-04-16 NOTE — Plan of Care (Signed)
  Problem: Pain Managment: Goal: General experience of comfort will improve Outcome: Progressing   

## 2020-04-16 NOTE — Progress Notes (Signed)
Patient ID: Sally Wiley, female   DOB: Jun 14, 1986, 34 y.o.   MRN: [id number] [facility] Attending Pt without complaints. Denies pain or ut ctx. Reports good fetal movement No vaginal bleeding  PE AF VSS FHT's 150's + accels, rare ut ctx GU deferred  A/P IUP 31 1/7 weeks       PROM  No evidence currently of infection or labor. Discussed with pt. Will transfer to [facility] and continue with routine antenatal care.

## 2020-04-17 ENCOUNTER — Inpatient Hospital Stay (HOSPITAL_COMMUNITY): Payer: BLUE CROSS/BLUE SHIELD | Admitting: Anesthesiology

## 2020-04-17 ENCOUNTER — Encounter (HOSPITAL_COMMUNITY): Payer: Self-pay | Admitting: Family Medicine

## 2020-04-17 ENCOUNTER — Encounter: Payer: BLUE CROSS/BLUE SHIELD | Admitting: Family Medicine

## 2020-04-17 DIAGNOSIS — O09219 Supervision of pregnancy with history of pre-term labor, unspecified trimester: Secondary | ICD-10-CM | POA: Diagnosis not present

## 2020-04-17 DIAGNOSIS — O99824 Streptococcus B carrier state complicating childbirth: Secondary | ICD-10-CM

## 2020-04-17 DIAGNOSIS — Z3A31 31 weeks gestation of pregnancy: Secondary | ICD-10-CM

## 2020-04-17 DIAGNOSIS — O42113 Preterm premature rupture of membranes, onset of labor more than 24 hours following rupture, third trimester: Secondary | ICD-10-CM

## 2020-04-17 DIAGNOSIS — Z8751 Personal history of pre-term labor: Secondary | ICD-10-CM | POA: Diagnosis not present

## 2020-04-17 LAB — CBC
HCT: 34.8 % — ABNORMAL LOW (ref 36.0–46.0)
Hemoglobin: 11.2 g/dL — ABNORMAL LOW (ref 12.0–15.0)
MCH: 29.9 pg (ref 26.0–34.0)
MCHC: 32.2 g/dL (ref 30.0–36.0)
MCV: 93 fL (ref 80.0–100.0)
Platelets: 250 10*3/uL (ref 150–400)
RBC: 3.74 MIL/uL — ABNORMAL LOW (ref 3.87–5.11)
RDW: 14.4 % (ref 11.5–15.5)
WBC: 17.5 10*3/uL — ABNORMAL HIGH (ref 4.0–10.5)
nRBC: 0 % (ref 0.0–0.2)

## 2020-04-17 LAB — RPR: RPR Ser Ql: NONREACTIVE

## 2020-04-17 MED ORDER — PHENYLEPHRINE 40 MCG/ML (10ML) SYRINGE FOR IV PUSH (FOR BLOOD PRESSURE SUPPORT): 80.0000 ug | PREFILLED_SYRINGE | INTRAVENOUS | Status: DC | PRN

## 2020-04-17 MED ORDER — EPHEDRINE 5 MG/ML INJ: 10.0000 mg | INTRAVENOUS | Status: DC | PRN

## 2020-04-17 MED ORDER — SENNOSIDES-DOCUSATE SODIUM 8.6-50 MG PO TABS
2.0000 | ORAL_TABLET | ORAL | Status: DC
  Administered 2020-04-18 (×2): 2 via ORAL
  Filled 2020-04-17 (×2): qty 2

## 2020-04-17 MED ORDER — FENTANYL-BUPIVACAINE-NACL 0.5-0.125-0.9 MG/250ML-% EP SOLN: 12.0000 mL/h | EPIDURAL | Status: DC | PRN

## 2020-04-17 MED ORDER — HYDRALAZINE HCL 20 MG/ML IJ SOLN: 10.0000 mg | INTRAMUSCULAR | Status: DC | PRN

## 2020-04-17 MED ORDER — ONDANSETRON HCL 4 MG/2ML IJ SOLN: 4.0000 mg | INTRAMUSCULAR | Status: DC | PRN

## 2020-04-17 MED ORDER — ONDANSETRON HCL 4 MG PO TABS: 4.0000 mg | ORAL_TABLET | ORAL | Status: DC | PRN

## 2020-04-17 MED ORDER — LIDOCAINE HCL (PF) 1 % IJ SOLN
INTRAMUSCULAR | Status: DC | PRN
  Administered 2020-04-17: 5 mL via EPIDURAL

## 2020-04-17 MED ORDER — FENTANYL-BUPIVACAINE-NACL 0.5-0.125-0.9 MG/250ML-% EP SOLN
EPIDURAL | Status: AC
  Filled 2020-04-17: qty 250

## 2020-04-17 MED ORDER — LABETALOL HCL 5 MG/ML IV SOLN: 40.0000 mg | INTRAVENOUS | Status: DC | PRN

## 2020-04-17 MED ORDER — IBUPROFEN 600 MG PO TABS
600.0000 mg | ORAL_TABLET | Freq: Four times a day (QID) | ORAL | Status: DC
  Administered 2020-04-17 – 2020-04-19 (×8): 600 mg via ORAL
  Filled 2020-04-17 (×8): qty 1

## 2020-04-17 MED ORDER — LACTATED RINGERS IV SOLN: 500.0000 mL | Freq: Once | INTRAVENOUS | Status: DC

## 2020-04-17 MED ORDER — TETANUS-DIPHTH-ACELL PERTUSSIS 5-2.5-18.5 LF-MCG/0.5 IM SUSP
0.5000 mL | Freq: Once | INTRAMUSCULAR | Status: AC
  Administered 2020-04-18: 0.5 mL via INTRAMUSCULAR
  Filled 2020-04-17: qty 0.5

## 2020-04-17 MED ORDER — WITCH HAZEL-GLYCERIN EX PADS: 1.0000 "application " | MEDICATED_PAD | CUTANEOUS | Status: DC | PRN

## 2020-04-17 MED ORDER — TERBUTALINE SULFATE 1 MG/ML IJ SOLN
INTRAMUSCULAR | Status: AC
  Filled 2020-04-17: qty 1

## 2020-04-17 MED ORDER — ACETAMINOPHEN 325 MG PO TABS: 650.0000 mg | ORAL_TABLET | ORAL | Status: DC | PRN

## 2020-04-17 MED ORDER — LABETALOL HCL 5 MG/ML IV SOLN
20.0000 mg | INTRAVENOUS | Status: DC | PRN
Start: 2020-04-17 — End: 2020-04-17

## 2020-04-17 MED ORDER — DIBUCAINE (PERIANAL) 1 % EX OINT: 1.0000 "application " | TOPICAL_OINTMENT | CUTANEOUS | Status: DC | PRN

## 2020-04-17 MED ORDER — OXYTOCIN-SODIUM CHLORIDE 30-0.9 UT/500ML-% IV SOLN
INTRAVENOUS | Status: AC
  Administered 2020-04-17: 333 mL via INTRAVENOUS
  Filled 2020-04-17: qty 500

## 2020-04-17 MED ORDER — MAGNESIUM SULFATE 40 GM/1000ML IV SOLN
2.0000 g/h | INTRAVENOUS | Status: DC
  Administered 2020-04-17: 2 g/h via INTRAVENOUS
  Filled 2020-04-17: qty 1000

## 2020-04-17 MED ORDER — LABETALOL HCL 5 MG/ML IV SOLN: 80.0000 mg | INTRAVENOUS | Status: DC | PRN

## 2020-04-17 MED ORDER — SODIUM CHLORIDE (PF) 0.9 % IJ SOLN
INTRAMUSCULAR | Status: DC | PRN
  Administered 2020-04-17: 12 mL/h via EPIDURAL

## 2020-04-17 MED ORDER — MAGNESIUM SULFATE BOLUS VIA INFUSION
4.0000 g | Freq: Once | INTRAVENOUS | Status: AC
  Administered 2020-04-17: 4 g via INTRAVENOUS
  Filled 2020-04-17: qty 1000

## 2020-04-17 MED ORDER — BENZOCAINE-MENTHOL 20-0.5 % EX AERO: 1.0000 "application " | INHALATION_SPRAY | CUTANEOUS | Status: DC | PRN

## 2020-04-17 MED ORDER — TERBUTALINE SULFATE 1 MG/ML IJ SOLN
0.2500 mg | Freq: Once | INTRAMUSCULAR | Status: AC
  Administered 2020-04-17: 0.25 mg via SUBCUTANEOUS

## 2020-04-17 MED ORDER — PENICILLIN G POT IN DEXTROSE 60000 UNIT/ML IV SOLN: 3.0000 10*6.[IU] | INTRAVENOUS | Status: DC

## 2020-04-17 MED ORDER — SIMETHICONE 80 MG PO CHEW: 80.0000 mg | CHEWABLE_TABLET | ORAL | Status: DC | PRN

## 2020-04-17 MED ORDER — COCONUT OIL OIL: 1.0000 "application " | TOPICAL_OIL | Status: DC | PRN

## 2020-04-17 MED ORDER — BETAMETHASONE SOD PHOS & ACET 6 (3-3) MG/ML IJ SUSP
12.0000 mg | Freq: Once | INTRAMUSCULAR | Status: AC
  Administered 2020-04-17: 12 mg via INTRAMUSCULAR
  Filled 2020-04-17: qty 5

## 2020-04-17 MED ORDER — DIPHENHYDRAMINE HCL 25 MG PO CAPS: 25.0000 mg | ORAL_CAPSULE | Freq: Four times a day (QID) | ORAL | Status: DC | PRN

## 2020-04-17 MED ORDER — ZOLPIDEM TARTRATE 5 MG PO TABS: 5.0000 mg | ORAL_TABLET | Freq: Every evening | ORAL | Status: DC | PRN

## 2020-04-17 MED ORDER — DIPHENHYDRAMINE HCL 50 MG/ML IJ SOLN: 12.5000 mg | INTRAMUSCULAR | Status: DC | PRN

## 2020-04-17 MED ORDER — SODIUM CHLORIDE 0.9 % IV SOLN
5.0000 10*6.[IU] | Freq: Once | INTRAVENOUS | Status: AC
  Administered 2020-04-17: 5 10*6.[IU] via INTRAVENOUS
  Filled 2020-04-17: qty 5

## 2020-04-17 MED ORDER — PRENATAL MULTIVITAMIN CH
1.0000 | ORAL_TABLET | Freq: Every day | ORAL | Status: DC
  Administered 2020-04-17 – 2020-04-19 (×3): 1 via ORAL
  Filled 2020-04-17 (×3): qty 1

## 2020-04-17 NOTE — Anesthesia Procedure Notes (Signed)
Epidural Patient location during procedure: OB Start time: 04/17/2020 3:09 AM End time: 04/17/2020 3:15 AM  Staffing Anesthesiologist: Shelton Silvas, MD Performed: anesthesiologist   Preanesthetic Checklist Completed: patient identified, IV checked, site marked, risks and benefits discussed, surgical consent, monitors and equipment checked, pre-op evaluation and timeout performed  Epidural Patient position: sitting Prep: ChloraPrep Patient monitoring: heart rate, continuous pulse ox and blood pressure Approach: midline Location: L3-L4 Injection technique: LOR saline  Needle:  Needle type: Tuohy  Needle gauge: 17 G Needle length: 9 cm Catheter type: closed end flexible Catheter size: 20 Guage Test dose: negative and 1.5% lidocaine  Assessment Events: blood not aspirated, injection not painful, no injection resistance and no paresthesia  Additional Notes LOR @ 4  Patient identified. Risks/Benefits/Options discussed with patient including but not limited to bleeding, infection, nerve damage, paralysis, failed block, incomplete pain control, headache, blood pressure changes, nausea, vomiting, reactions to medications, itching and postpartum back pain. Confirmed with bedside nurse the patient's most recent platelet count. Confirmed with patient that they are not currently taking any anticoagulation, have any bleeding history or any family history of bleeding disorders. Patient expressed understanding and wished to proceed. All questions were answered. Sterile technique was used throughout the entire procedure. Please see nursing notes for vital signs. Test dose was given through epidural catheter and negative prior to continuing to dose epidural or start infusion. Warning signs of high block given to the patient including shortness of breath, tingling/numbness in hands, complete motor block, or any concerning symptoms with instructions to call for help. Patient was given instructions on  fall risk and not to get out of bed. All questions and concerns addressed with instructions to call with any issues or inadequate analgesia.    Reason for block:procedure for pain

## 2020-04-17 NOTE — Discharge Summary (Signed)
Postpartum Discharge Summary      Patient Name: Sally Wiley DOB: 1986/06/13 MRN: [id number]  Date of admission: 04/05/2020 Delivery date:04/17/2020  Delivering provider: Merilyn Baba  Date of discharge: 04/19/2020  Admitting diagnosis: Preterm premature rupture of membranes (PPROM) with unknown onset of labor [O42.919] Intrauterine pregnancy: [redacted]w[redacted]d    Secondary diagnosis:  Principal Problem:   Preterm premature rupture of membranes (PPROM) with unknown onset of labor Active Problems:   Cervical incompetence   Cervical cerclage suture present   Anemia, antepartum   Preterm labor in third trimester with preterm delivery  Additional problems: H/o trichomonias vaginalis with negative TOC   Discharge diagnosis: Preterm Pregnancy Delivered                                              Post partum procedures:none Augmentation: AROM Complications: None  Hospital course: Onset of Labor With Vaginal Delivery      34y.o. yo G2P0100 at 32w2das admitted for PPROM on 04/05/2020. Patient had an uncomplicated labor course as follows:   Admitted with PPROM in the context of having a cerclage in place 2/2 cervical incompetence with history of delivery and demise at 22 weeks. She received magnesium, BMZ x2 on 6/9 and 6/10, as well as latency antibiotics. She reported worsening contractions, and was transferred up to L&D on 6/21. She was given a rescue dose of BMZ, magnesium, and one dose of PCN. She received an epidural, and her cerclage was removed by Dr. ErRip HarbourShe was subsequently found to be 9.5 cm dilated with a forebag, which was AROMed. She progressed to complete and delivered after a short second stage.  Membrane Rupture Time/Date: 4:00 AM ,04/05/2020   Delivery Method:Vaginal, Spontaneous  Episiotomy: None  Lacerations:  Periurethral  Patient had an uncomplicated postpartum course.  She is ambulating, tolerating a regular diet, passing flatus, and urinating well. Patient is  discharged home in stable condition on 04/19/20.  Newborn Data: Birth date:04/17/2020  Birth time:4:10 AM  Gender:Female  Living status:Living  Apgars:7 ,9  Weight:1610 g   Magnesium Sulfate received: Yes: Neuroprotection BMZ received: Yes Rhophylac:N/A MMR:N/A T-DaP:Given prenatally Flu: N/A Transfusion:No  Physical exam  Vitals:   04/18/20 2152 04/19/20 0452 04/19/20 0646 04/19/20 0754  BP: 117/69 107/60 106/74 99/67  Pulse: 92 72 76 80  Resp: '17 18 17 18  ' Temp: 98.7 F (37.1 C) 98.7 F (37.1 C) 98.1 F (36.7 C) 98.2 F (36.8 C)  TempSrc: Oral Oral Oral Oral  SpO2: 99% 100% 100% 100%  Weight:      Height:       General: alert, cooperative and no distress Lochia: appropriate Uterine Fundus: firm Incision: N/A DVT Evaluation: No evidence of DVT seen on physical exam. Labs: Lab Results  Component Value Date   WBC 17.5 (H) 04/17/2020   HGB 11.2 (L) 04/17/2020   HCT 34.8 (L) 04/17/2020   MCV 93.0 04/17/2020   PLT 250 04/17/2020   No flowsheet data found. Edinburgh Score: Edinburgh Postnatal Depression Scale Screening Tool 04/18/2020  I have been able to laugh and see the funny side of things. (No Data)  I have looked forward with enjoyment to things. -  I have blamed myself unnecessarily when things went wrong. -  I have been anxious or worried for no good reason. -  I have felt scared  or panicky for no good reason. -  Things have been getting on top of me. -  I have been so unhappy that I have had difficulty sleeping. -  I have felt sad or miserable. -  I have been so unhappy that I have been crying. -  The thought of harming myself has occurred to me. Flavia Shipper Postnatal Depression Scale Total -     After visit meds:  Allergies as of 04/19/2020      Reactions   Shellfish Allergy Hives      Medication List    STOP taking these medications   acetaminophen 500 MG tablet Commonly known as: TYLENOL   AMBULATORY NON FORMULARY MEDICATION   ferrous  sulfate 324 (65 Fe) MG Tbec   Makena autoinjector Generic drug: HYDROXYprogesterone caproate     TAKE these medications   prenatal multivitamin Tabs tablet Take 1 tablet by mouth daily at 12 noon.        Discharge home in stable condition Infant Feeding: No evidence of DVT seen on physical exam. Infant Disposition:NICU Discharge instruction: per After Visit Summary and Postpartum booklet. Activity: Advance as tolerated. Pelvic rest for 6 weeks.  Diet: routine diet Future Appointments: Future Appointments  Date Time Provider [place]  05/25/2020 10:00 AM Aletha Halim, MD [facility] None   Follow up Visit:  [place]. Go to.   Specialty: Obstetrics and Gynecology Contact information: [place] [address] [phone]               Please schedule this patient for a In person postpartum visit in 4 weeks with the following provider: Any provider. Additional Postpartum F/U: None  High risk pregnancy complicated by: h/o 22 week delivery and demise, incompetent cervix, PPROM with PTL Delivery mode:  Vaginal, Spontaneous  Anticipated Birth Control:  POPs   04/19/2020 Sloan Leiter, MD

## 2020-04-17 NOTE — Anesthesia Postprocedure Evaluation (Signed)
Anesthesia Post Note  Patient: Sally Wiley  Procedure(s) Performed: AN AD HOC LABOR EPIDURAL     Patient location during evaluation: Mother Baby Anesthesia Type: Epidural Level of consciousness: awake and alert Pain management: pain level controlled Vital Signs Assessment: post-procedure vital signs reviewed and stable Respiratory status: spontaneous breathing, nonlabored ventilation and respiratory function stable Cardiovascular status: stable Postop Assessment: no headache, no backache and epidural receding Anesthetic complications: no   No complications documented.  Last Vitals:  Vitals:   04/17/20 0738 04/17/20 1110  BP: 126/79 120/75  Pulse: (!) 106 (!) 108  Resp: 18 19  Temp: 36.6 C 36.8 C  SpO2: 99% 99%    Last Pain:  Vitals:   04/17/20 1300  TempSrc:   PainSc: 0-No pain   Pain Goal: Patients Stated Pain Goal: 3 (04/17/20 0645)                 Fanny Dance

## 2020-04-17 NOTE — Progress Notes (Addendum)
Sally Wiley is a 34 y.o. G2P0100 at [redacted]w[redacted]d admitted for pPROM on 6/9, in the context of having a cerclage in place 2/2 cervical incompetence with history of delivery and demise at 22 weeks.  Subjective: Transferred from [facility] due to increase in contractions and rectal pressure.  Objective: BP 106/65 (BP Location: Left Arm)   Pulse 97   Temp 97.7 F (36.5 C) (Oral)   Resp 18   Ht 5\' 3"  (1.6 m)   Wt 60.3 kg   LMP 09/24/2019 (Within Days)   SpO2 99%   BMI 23.56 kg/m  No intake/output data recorded.  FHT:  FHR: 125 bpm, variability: moderate,  accelerations:  Present- 15x15,  decelerations:  Present variables UC:   regular, every 4-5 minutes  SVE:   Dilation: Closed Exam by:: Dr. [email or web address]  Labs: Lab Results  Component Value Date   WBC 19.1 (H) 04/16/2020   HGB 11.5 (L) 04/16/2020   HCT 35.3 (L) 04/16/2020   MCV 92.4 04/16/2020   PLT 306 04/16/2020    Assessment / Plan: 34 y.o. G2P0100 at 31.2 EGA who was admitted for PPROM,  in the context of having a cerclage in place 2/2 cervical incompetence with history of delivery and demise at 22 weeks. S/p latency antibiotics.  Labor: cerclage in place. Patient requests epidural prior to cerclage removal. Terbutaline given. Stat CBC ordered, plan to remove cerclage once she is comfortable with epidural. Fetal Wellbeing:  Category II, reassuring for moderate variability and presence of accels. S/p BMZ 6/9 and 6/10- will give rescue dose as >10 days out from last dose. Vertex by BSUS. Will add magnesium for neuro-protection Pain Control:  Epidural I/D:  GBS+ by PCR, give PCN Anticipated MOD:  NSVD  Ariely Riddell L Enyah Moman DO OB Fellow, Faculty Practice 04/17/2020, 2:42 AM

## 2020-04-17 NOTE — Progress Notes (Signed)
Called to NICU charge nurse Victorino Dike and notified patient was brought up to L&D from [facility] laboring and preterm at 31.2 weeks.

## 2020-04-17 NOTE — Lactation Note (Signed)
This note was copied from a baby's chart. Lactation Consultation Note  Patient Name: Sally Wiley [id number] Date: 04/17/2020 Reason for consult: Initial assessment;1st time breastfeeding;NICU baby;Preterm <34wks;Infant < 6lbs  LC in to visit with P2 (history of 22 wk fetal demise) Mom of preterm infant in the NICU.  Baby is 5 hrs old.  Asked Mom what her feeding preference was and she stated she would like to offer baby her breast milk.  Praised her for her commitment to help baby receive her EBM.  Reviewed importance of breast massage and hand expression, demonstrating this with permission.  Unable to express colostrum at present.  Colostrum containers provided, and Mom aware of breast milk labels available in the NICU.  Set up a DEBP and assisted Mom with her first pumping.  24 mm flanges are the proper fit currently.  Mom reports + breast changes in early pregnancy.  Mom assisted in using the initiation setting on the pump.  Educated Mom to pump both breasts every 2-3 hrs during the day and 3-4 hrs at night, and hand express prn.  Mom explained how to disassemble pump parts, wash, rinse and air dry in separate bin away from sink.    Mom has WIC in [place].  Her address in the chart in [facility] (possibly staying with her Mom)  Faxed referral to both [employer] and [facility].  Mom aware of the importance of a strong hospital grade pump and that the baby's room has a Medela Symphony available for her to use.  NICU booklet and Lactation brochure left with Mom.  Mom aware of lactation support available to her, encouraged her to ask her RN if she would like to talk to [facility].  Interventions Interventions: Breast feeding basics reviewed;Skin to skin;Breast massage;Hand express;DEBP  Lactation Tools Discussed/Used Tools: Pump;Flanges Flange Size: 24 Breast pump type: Double-Electric Breast Pump WIC Program: Yes Pump Review: Setup, frequency, and cleaning;Milk  Storage Initiated by:: Erby Pian RN IBCLC Date initiated:: 04/17/20   Consult Status Consult Status: Follow-up Date: 04/18/20 Follow-up type: In-patient    [name] 04/17/2020, 12:15 PM

## 2020-04-17 NOTE — Discharge Instructions (Signed)

## 2020-04-17 NOTE — Anesthesia Preprocedure Evaluation (Addendum)
Anesthesia Evaluation  Patient identified by MRN, date of birth, ID band Patient awake    Reviewed: Allergy & Precautions, Patient's Chart, lab work & pertinent test results  Airway Mallampati: I       Dental   Pulmonary neg pulmonary ROS,    Pulmonary exam normal        Cardiovascular negative cardio ROS Normal cardiovascular exam     Neuro/Psych negative neurological ROS     GI/Hepatic Neg liver ROS,   Endo/Other    Renal/GU      Musculoskeletal   Abdominal   Peds  Hematology   Anesthesia Other Findings   Reproductive/Obstetrics (+) Pregnancy                            Anesthesia Physical Anesthesia Plan  ASA: II  Anesthesia Plan: Epidural   Post-op Pain Management:    Induction:   PONV Risk Score and Plan: 0  Airway Management Planned: Natural Airway  Additional Equipment: None  Intra-op Plan:   Post-operative Plan:   Informed Consent: I have reviewed the patients History and Physical, chart, labs and discussed the procedure including the risks, benefits and alternatives for the proposed anesthesia with the patient or authorized representative who has indicated his/her understanding and acceptance.       Plan Discussed with:   Anesthesia Plan Comments: (Lab Results      Component                Value               Date                      WBC                      17.5 (H)            04/17/2020                HGB                      11.2 (L)            04/17/2020                HCT                      34.8 (L)            04/17/2020                MCV                      93.0                04/17/2020                PLT                      250                 04/17/2020           )       Anesthesia Quick Evaluation

## 2020-04-17 NOTE — Plan of Care (Signed)
  Problem: Pain Managment: Goal: General experience of comfort will improve Outcome: Progressing   

## 2020-04-17 NOTE — Consult Note (Addendum)
Neonatology Note:   Attendance at C-section:    I was asked by Dr. Ervin to attend this vaginal delivry at 31 2/7 wks for chorio concerns. The mother is a G2P0100, GBS + PCN <4h (h/o latency abx) with good prenatal care complicated by PPROM / PTL, cerclage, with previous fetal demise at 22 weeks.  BTMZ complete 6/10 with another dose today.  Baby also started on IV Mag just PTD.  ROM 288h 10m prior to delivery, fluid clear. Infant vigorous with good spontaneous cry and tone.  +60 sec DCC.  Needed minimal bulb suctioning. HR >100.  Weak cry, cyanotic.  BBO2 initiated then transitioned to CPAP 5. Sao2 placed and oxygen titrated accordingly. Intermittent and brief PPV given to help recruit lungs due to limited resp effort at times.  Stabilized on cpap 6 40%.  Lungs coarse and equal to ausc in DR.  Ap 7/9.  Mother updated; GM accompanied us to NICU.  No issues during transfer.    Adolph Clutter C. Kassandra Meriweather, MD 

## 2020-04-18 LAB — SURGICAL PATHOLOGY

## 2020-04-18 MED ORDER — MEDROXYPROGESTERONE ACETATE 150 MG/ML IM SUSP
150.0000 mg | Freq: Once | INTRAMUSCULAR | Status: AC
  Administered 2020-04-19: 150 mg via INTRAMUSCULAR
  Filled 2020-04-18 (×2): qty 1

## 2020-04-18 NOTE — Progress Notes (Signed)
Faculty Attending Note  Post Partum Day 1  Subjective: Patient is feeling very well. She reports no pain. She is ambulating and denies light-headedness or dizziness. She is passing flatus. She is tolerating a regular diet without nausea/vomiting. Bleeding is moderate. She is breast feeding. Baby is in NICU and doing well.  Objective: Blood pressure 122/70, pulse (!) 110, temperature 98.6 F (37 C), temperature source Oral, resp. rate 18, height 5\' 3"  (1.6 m), weight 60.3 kg, last menstrual period 09/24/2019, SpO2 100 %, unknown if currently breastfeeding. Temp:  [98 F (36.7 C)-98.8 F (37.1 C)] 98.6 F (37 C) (06/22 0850) Pulse Rate:  [89-113] 110 (06/22 0850) Resp:  [18] 18 (06/22 0850) BP: (103-122)/(60-71) 122/70 (06/22 0850) SpO2:  [98 %-100 %] 100 % (06/22 0850)  Physical Exam:  General: alert, oriented, cooperative Chest: normal respiratory effort Heart: RRR  Abdomen: soft, appropriately tender to palpation  Uterine Fundus: firm, below the umbilicus Lochia: moderate, rubra DVT Evaluation: no evidence of DVT Extremities: no edema, no calf tenderness  UOP: voiding spontaneously  Recent Labs    04/16/20 1100 04/17/20 0245  HGB 11.5* 11.2*  HCT 35.3* 34.8*    Assessment/Plan: Patient Active Problem List   Diagnosis Date Noted  . Preterm labor in third trimester with preterm delivery 04/17/2020  . Preterm premature rupture of membranes (PPROM) with unknown onset of labor 04/05/2020  . Anemia, antepartum 04/03/2020  . Cervical cerclage suture present 12/16/2019  . Short interval between pregnancies affecting pregnancy, antepartum 11/25/2019  . Cervical laceration 11/25/2019  . Cervical incompetence 08/12/2019    Patient is 34 y.o. 38 PPD#1 s/p SVD at [redacted]w[redacted]d. Course complicated by pre-term labor. She is doing very well, recovering appropriately with no complaints. Baby is doing well. Encouraged pumping to establish milk supply.    Continue routine post partum  care Pain meds prn Regular diet depo for birth control Plan for discharge tomorrow   K. [redacted]w[redacted]d, M.D. Attending [facility] (Faculty Practice)  04/18/2020, 12:56 PM

## 2020-04-18 NOTE — Lactation Note (Signed)
This note was copied from a baby's chart. 1148Lactation Consultation Note  Patient Name: Sally Wiley [id number] Date: 04/18/2020 Reason for consult: Follow-up assessment;NICU baby;Preterm <34wks  1148 - I followed up with Sally Wiley. She reports that she is pumping, and she reviewed her pumping schedule of pumping every 2-3 hours during the day and every 3-4 hours every night. She states that her flanges appears to be appropriate.  Routt referral made by [facility] yesterday. We reviewed pumping best practices and when to anticipate lactogenesis II. Sally Wiley states that she's feeling well today. Will be visiting her son, Sally Wiley, in NICU today. I encouraged her to take her pump kit with her if she plans to stay a while.   No further questions at this time.   Interventions Interventions: Breast feeding basics reviewed  Lactation Tools Discussed/Used Pump Review: Setup, frequency, and cleaning   Consult Status Consult Status: Follow-up Date: 04/19/20 Follow-up type: In-patient    Sally Wiley 04/18/2020, 12:34 PM

## 2020-04-19 NOTE — Clinical Social Work Maternal (Signed)
CLINICAL SOCIAL WORK MATERNAL/CHILD NOTE  Patient Details  Name: Sally Wiley MRN: [id number] Date of Birth: 10/11/1986  Date:  11-27-2019  Clinical Social Worker Initiating Note:  Sally Wiley, [place] Date/Time: Initiated:  04/19/20/1401     Child's Name:  Sally Wiley   Biological Parents:  Mother, Father (Father: Sally Wiley)   Need for Interpreter:  None   Reason for Referral:  Parental Support of Premature Babies < 32 weeks/or Critically Ill babies   Address:  [address] [place] [address]    Phone number:  [phone] (home)     Additional phone number:   Household Members/Support Persons (HM/SP):       HM/SP Name Relationship DOB or Age  HM/SP -1        HM/SP -2        HM/SP -3        HM/SP -4        HM/SP -5        HM/SP -6        HM/SP -7        HM/SP -8          Natural Supports (not living in the home):  Parent, Spouse/significant other   Professional Supports: None   Employment: Self-employed   Type of Work: Geologist, engineering:  Nurse, adult   Homebound arranged:    Pensions consultant:  Multimedia programmer   Other Resources:  [facility]   Cultural/Religious Considerations Which May Impact Care:    Strengths:  Ability to meet basic needs , Pediatrician chosen   Psychotropic Medications:         Pediatrician:    Careers adviser area  Pediatrician List:   Ecologist Other ([facility])  [place]      Pediatrician Fax Number:    Risk Factors/Current Problems:  None   Cognitive State:  Alert , Able to Concentrate , Linear Thinking , Goal Oriented    Mood/Affect:  Interested , Comfortable , Calm , Relaxed    CSW Assessment: CSW met with MOB at infant's bedside to complete psychosocial assessment. CSW introduced self and explained reason for visit. MOB was welcoming, pleasant and remained engaged during assessment.  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 MOB reported that she receives [employer]. MOB reported that they have some items for infant including a crib and basinet. MOB reported that they plan to look for a car seat today. CSW informed MOB about Family Support [facility] [employer] if any assistance is need obtaining items for infant. MOB reported that she does not anticipate needing help obtaining items for infant. CSW inquired about MOB's support system, MOB reported that her mom and FOB are her supports.   CSW inquired about MOB's mental health history, MOB denied any mental health history. CSW inquired about how MOB was feeling emotionally after giving birth, MOB reported that she was feeling good. MOB presented calm and did not demonstrate any acute mental health signs/symptoms. CSW assessed for safety, MOB denied SI, HI and domestic violence.   CSW provided education regarding the baby blues period vs. perinatal mood disorders, discussed treatment and gave resources for mental health follow up if concerns arise.  CSW recommends self-evaluation during the postpartum time period using the New Mom Checklist from Postpartum Progress and encouraged MOB to contact a medical professional if symptoms are noted  at any time.    CSW provided review of Sudden Infant Death Syndrome (SIDS) precautions.    CSW and MOB discussed infant's NICU admission. CSW informed MOB about the NICU, what to expect and resources/supports available while infant is admitted to the NICU. MOB reported that she feels well informed about infant's care. MOB denied any transportation barriers with visiting infant in the NICU. MOB denied any questions/concerns regarding the NICU.   CSW will continue to offer resources/supports while infant is admitted to the NICU.    CSW Plan/Description:  Sudden Infant Death Syndrome (SIDS) Education, Perinatal Mood and Anxiety Disorder (PMADs) Education, Other Patient/Family  Education    Sally Stupka L Kerrianne Jeng, LCSW 04/19/2020, 2:03 PM 

## 2020-04-19 NOTE — Progress Notes (Signed)
Pt discharged after discharge instructions given. All questions answered. Pt ambulated at discharge in stable condition. All belongings sent with patient.

## 2020-04-21 ENCOUNTER — Ambulatory Visit: Payer: BLUE CROSS/BLUE SHIELD

## 2020-04-24 ENCOUNTER — Ambulatory Visit: Payer: BLUE CROSS/BLUE SHIELD

## 2020-05-07 ENCOUNTER — Other Ambulatory Visit: Payer: Self-pay

## 2020-05-07 ENCOUNTER — Inpatient Hospital Stay (HOSPITAL_COMMUNITY)
Admission: AD | Admit: 2020-05-07 | Discharge: 2020-05-07 | Disposition: A | Discharge status: Home / Self Care | Payer: BLUE CROSS/BLUE SHIELD | Attending: Obstetrics and Gynecology | Admitting: Obstetrics and Gynecology

## 2020-05-07 ENCOUNTER — Encounter (HOSPITAL_COMMUNITY): Payer: Self-pay | Admitting: Obstetrics and Gynecology

## 2020-05-07 DIAGNOSIS — O99893 Other specified diseases and conditions complicating puerperium: Secondary | ICD-10-CM | POA: Diagnosis not present

## 2020-05-07 DIAGNOSIS — N751 Abscess of Bartholin's gland: Secondary | ICD-10-CM | POA: Insufficient documentation

## 2020-05-07 MED ORDER — LIDOCAINE HCL (PF) 1 % IJ SOLN
30.0000 mL | Freq: Once | INTRAMUSCULAR | Status: AC
  Administered 2020-05-07: 3 mL via SUBCUTANEOUS
  Filled 2020-05-07: qty 30

## 2020-05-07 MED ORDER — LIDOCAINE HCL URETHRAL/MUCOSAL 2 % EX GEL
1.0000 "application " | Freq: Once | CUTANEOUS | Status: AC
  Administered 2020-05-07: 1 via TOPICAL
  Filled 2020-05-07: qty 5

## 2020-05-07 MED ORDER — AMOXICILLIN-POT CLAVULANATE 875-125 MG PO TABS: 1.0000 | ORAL_TABLET | Freq: Two times a day (BID) | ORAL | 0 refills | Status: AC

## 2020-05-07 MED ORDER — OXYCODONE-ACETAMINOPHEN 5-325 MG PO TABS
2.0000 | ORAL_TABLET | Freq: Once | ORAL | Status: AC
  Administered 2020-05-07: 2 via ORAL
  Filled 2020-05-07: qty 2

## 2020-05-07 MED ORDER — OXYCODONE HCL 5 MG PO TABS: 5.0000 mg | ORAL_TABLET | Freq: Three times a day (TID) | ORAL | 0 refills | Status: AC | PRN

## 2020-05-07 NOTE — MAU Provider Note (Signed)
Chief Complaint: Bartholin's cyst   First Provider Initiated Contact with Patient 05/07/20 1008     SUBJECTIVE HPI: Sally Wiley is a 34 y.o. G2P0201 at 3 weeks postpartum who presents to Maternity Admissions reporting Bartholin's gland abscess.  This is a recurrent issue.  She had a marsupialization of the right Bartholin's gland in May.  Current symptoms started last night, noticed a mass the size of a golf ball on her right labia.  Has not treated symptoms.  No drainage.  No fever.  Location: right labia Quality: throbbing Severity: 10/10 on pain scale Duration: 1 day Timing: constant  Modifying factors: worse with touch & movement Associated signs and symptoms: none  Past Medical History:  Diagnosis Date  . Bartholin gland cyst 09/13/2011  . Bartholin's gland abscess 09/13/2011  . Supervision of other normal pregnancy, antepartum 11/25/2019   BABYSCRIPTS PATIENT: [ ] Initial [x] 12 [x]  15 [ ] 20 [ ] 28 [ ] 32 [ ] 36 [ ] 38 [ ] 39 [ ] 40 Nursing Staff Provider Office Location [facility]  Dating   Language  English  Anatomy   Flu Vaccine  Declined 11/25/19 Genetic Screen  NIPS: low risk   AFP Neg TDaP vaccine   04-03-20 Hgb A1C or  GTT Early  Third trimester: Normal    Glucose, Fasting 65 - 91 mg/dL 75  Glucose, 1 hour 65 - 179 mg/dL  Glucose,    OB History  Gravida Para Term Preterm AB Living  2 2   2   1   SAB TAB Ectopic Multiple Live Births        0 1    # Outcome Date GA Lbr Len/2nd Weight Sex Delivery Anes PTL Lv  2 Preterm 04/17/20 [redacted]w[redacted]d 01:56 / 00:14 1610 g M Vag-Spont EPI  LIV  1 Preterm 06/22/19 [redacted]w[redacted]d  439 g M   Y FD    Obstetric Comments  Had cervical incompetence with first pregnancy, delivered at 22wks, breech with head entrapment with subsequent Duhrssen's Incision and cervical repair. Occurred at [facility].   Past Surgical History:  Procedure Laterality Date  . CERVICAL CERCLAGE N/A 12/16/2019   Procedure: CERCLAGE CERVICAL;  Surgeon: 11/27/19,  MD;  Location: [facility] LD ORS;  Service: Gynecology;  Laterality: N/A;  . DILATION AND CURETTAGE OF UTERUS     Social History   Socioeconomic History  . Marital status: Single    Spouse name: Not on file  . Number of children: Not on file  . Years of education: Not on file  . Highest education level: Not on file  Occupational History  . Not on file  Tobacco Use  . Smoking status: Never Smoker  . Smokeless tobacco: Never Used  Vaping Use  . Vaping Use: Never used  Substance and Sexual Activity  . Alcohol use: Not Currently    Comment: socailly  . Drug use: Never  . Sexual activity: Yes  Other Topics Concern  . Not on file  Social History Narrative  . Not on file   Social Determinants of Health   Financial Resource Strain:   . Difficulty of Paying Living Expenses:   Food Insecurity:   . Worried About 06-03-20 in the Last Year:   . 973 in the Last Year:   Transportation Needs:   . (Medical):   04/19/20 Lack of Transportation (Non-Medical):   Physical Activity:   . Days of Exercise per Week:   . Minutes of Exercise per  Session:   Stress:   . Feeling of Stress :   Social Connections:   . Frequency of Communication with Friends and Family:   . Frequency of Social Gatherings with Friends and Family:   . Attends Religious Services:   . Active Member of Clubs or Organizations:   . Attends Banker Meetings:   Marland Kitchen Marital Status:   Intimate Partner Violence:   . Fear of Current or Ex-Partner:   . Emotionally Abused:   Marland Kitchen Physically Abused:   . Sexually Abused:    Family History  Problem Relation Age of Onset  . Cancer Paternal Grandmother   . Heart disease Mother   . Hypertension Mother   . Diabetes Neg Hx    No current facility-administered medications on file prior to encounter.   Current Outpatient Medications on File Prior to Encounter  Medication Sig Dispense Refill  . Prenatal Vit-Fe Fumarate-FA (PRENATAL  MULTIVITAMIN) TABS tablet Take 1 tablet by mouth daily at 12 noon.     Allergies  Allergen Reactions  . Shellfish Allergy Hives    I have reviewed patient's Past Medical Hx, Surgical Hx, Family Hx, Social Hx, medications and allergies.   Review of Systems  Constitutional: Negative.   Gastrointestinal: Negative.   Genitourinary: Positive for vaginal discharge. Negative for vaginal pain.    OBJECTIVE Patient Vitals for the past 24 hrs:  BP Temp Temp src Pulse Resp SpO2 Height Weight  05/07/20 1207 116/77 -- -- 86 16 99 % -- --  05/07/20 0949 109/70 98.7 F (37.1 C) Oral 87 16 99 % -- --  05/07/20 0946 -- -- -- -- -- -- 5\' 3"  (1.6 m) 57.7 kg   Constitutional: Well-developed, well-nourished female in no acute distress.  Cardiovascular: normal rate & rhythm, no murmur Respiratory: normal rate and effort. Lung sounds clear throughout GI: Abd soft, non-tender, Pos BS x 4. No guarding or rebound tenderness MS: Extremities nontender, no edema, normal ROM Neurologic: Alert and oriented x 4.  GU: Right labial mass. Fluctuant. No drainage. ~6 cm.   LAB RESULTS Results for orders placed or performed during the hospital encounter of 05/07/20 (from the past 24 hour(s))  Anaerobic culture     Status: None (Preliminary result)   Collection Time: 05/07/20 11:39 AM   Specimen: Cyst  Result Value Ref Range   Specimen Description CYSTS    Special Requests      BARTHOLINS GLAND CYST Performed at [facility], [address] [place], [place]    Culture PENDING    Report Status PENDING     IMAGING No results found.  MAU COURSE Orders Placed This Encounter  Procedures  . Anaerobic culture  . Discharge patient   Meds ordered this encounter  Medications  . lidocaine (XYLOCAINE) 2 % jelly 1 application  . lidocaine (PF) (XYLOCAINE) 1 % injection 30 mL  . oxyCODONE-acetaminophen (PERCOCET/ROXICET) 5-325 MG per tablet 2 tablet  . amoxicillin-clavulanate (AUGMENTIN)  875-125 MG tablet    Sig: Take 1 tablet by mouth 2 (two) times daily for 7 days.    Dispense:  14 tablet    Refill:  0    Order Specific Question:   Supervising Provider    Answer:   [id number] [place]  . oxyCODONE (ROXICODONE) 5 MG immediate release tablet    Sig: Take 1 tablet (5 mg total) by mouth every 8 (eight) hours as needed for up to 3 days for breakthrough pain.    Dispense:  9 tablet    Refill:  0    Order Specific Question:   Supervising Provider    Answer:   [place] [[id number]]    MDM Bartholin's gland abscess s/p marsupialization. I&D performed by Venia Carbon FNP (see separate note).  C/w Dr. Vergie Living. Wound culture collected. Will rx abx per previous culture. Pt to f/u with MD in the office -- she is scheduled to see Dr. Vergie Living on 7/29  ASSESSMENT 1. Abscess of right Bartholin's gland     PLAN Discharge home in stable condition. Rx augmentin Cultures pending    Allergies as of 05/07/2020      Reactions   Shellfish Allergy Hives      Medication List    TAKE these medications   amoxicillin-clavulanate 875-125 MG tablet Commonly known as: Augmentin Take 1 tablet by mouth 2 (two) times daily for 7 days.   oxyCODONE 5 MG immediate release tablet Commonly known as: Roxicodone Take 1 tablet (5 mg total) by mouth every 8 (eight) hours as needed for up to 3 days for breakthrough pain.   prenatal multivitamin Tabs tablet Take 1 tablet by mouth daily at 12 noon.        Judeth Horn, NP 05/07/2020  6:32 PM

## 2020-05-07 NOTE — MAU Note (Signed)
Sally Wiley is a 34 y.o. here in MAU reporting: had a vaginal delivery 04/17/20. States on right side she has a bartholin's gland that is the size of a golfball. Noticed it last night.   Onset of complaint: last night  Pain score: 10/10  Vitals:   05/07/20 0949  BP: 109/70  Pulse: 87  Resp: 16  Temp: 98.7 F (37.1 C)  SpO2: 99%     Lab orders placed from triage: none

## 2020-05-07 NOTE — MAU Provider Note (Signed)
Preoperative medication: Bartholin Cyst I&D and Ward Catheter Placement Enlarged abscess palpated in front of the hymenal ring around 7-9 o'clock.   Written informed consent was obtained.  Discussed complications and possible outcomes of procedure including recurrence of cyst, scarring leading to infecton, bleeding, dyspareunia, distortion of anatomy.  Patient was examined in the dorsal lithotomy position and mass was identified.  The area was prepped with Iodine and draped in a sterile manner. 1% Lidocaine (3 ml) was then used to infiltrate area on top of the cyst, behind the hymenal ring.  A 7 mm incision was made using a sterile scapel. Upon palpation of the mass, a moderate amount of bloody purulent drainage was expressed through the incision. A hemostat was used to break up loculations, which resulted in expression of more bloody purulent drainage.  - Samples of the drainage were sent for cultures.  -The open cyst was then copiously irrigated with normal saline.  -Patient tolerated the procedure well, reported feeling " a lot better."   - Recommended Sitz baths bid and Motrin and Percocet was given  prn pain.    She was told to call to be examined if she experiences increasing swelling, pain, vaginal discharge, or fever.  - She was instructed to wear a peripad to absorb discharge, and to maintain pelvic rest while the Word catheter is in place.  -The catheter will be left in place for at least two to four weeks to promote formation of an epithelialized tract for permanent drainage of glandular secretions.  - She is scheduled for outpatient clinic visit with Dr. Vergie Living.  - Culture collected.   Duane Lope, NP 05/07/2020 11:38 AM

## 2020-05-07 NOTE — Discharge Instructions (Signed)
Bartholin's Cyst  A Bartholin's cyst is a fluid-filled sac that forms on a Bartholin's gland. Bartholin's glands are small glands in the folds of skin around the vaginal opening (labia). These glands produce a fluid to moisten (lubricate) the outside of the vagina during sex. A cyst that is not large or infected may not cause any problems or require treatment. If the cyst gets infected, it is called a Bartholin's abscess. An abscess may cause symptoms such as pain and swelling and is more likely to require treatment. What are the causes? This condition may be caused by a blocked Bartholin's gland. These glands can become blocked due to natural buildup of fluid and oils. Bacteria inside of the cyst can cause infection. In many cases, the cause is not known. What increases the risk? You may be at increased risk of developing a Bartholin's cyst or abscess if:  You are of childbearing age.  You have a history of Bartholin's cysts or abscesses.  You have diabetes.  You have an STI (sexually transmitted infection). What are the signs or symptoms? Symptoms may include:  A bulge or lump on the labia, near the lower opening of the vagina.  Discomfort or pain. This may get worse during sex or when walking.  Redness, swelling, or fluid draining from the area. These may be signs of an abscess. How severe your symptoms are depends on the size of your cyst and whether it is infected. Infection causes symptoms to get more severe. How is this diagnosed? This condition may be diagnosed based on:  Your symptoms and medical history.  A physical exam to check for swelling in your vaginal area. You may lie on your back on an exam table and have your feet placed into footrests for the exam.  Blood tests to check for infections.  Removal of a fluid sample from the cyst or abscess (biopsy) for testing. You may work with a health care provider who specializes in women's health (gynecologist) for diagnosis  and treatment. How is this treated? If your cyst is small, not infected, and not causing symptoms, you may not need any treatment. These cysts often go away on their own, with home care such as hot baths or warm compresses. If you have a large cyst or an abscess, treatment may include:  Antibiotic medicine.  A procedure to drain the fluid inside the cyst or abscess. These procedures involve making an incision in the cyst or abscess so that the fluid drains out, and then one of the following may be done: ? A small, thin tube (catheter) may be placed inside the cyst or abscess so that it does not close and fill up with fluid again (fistulization). The catheter will be removed at a follow-up visit. ? The edges of the incision may be stitched to your skin so that the cyst or abscess stays open (marsupialization). This allows it to continue to drain and not fill up with fluid again. If you have cysts or abscesses that keep returning (recurring) and have required incision and drainage multiple times, your health care provider may talk with you about surgery to remove the Bartholin's gland. Follow these instructions at home: Medicines  Take over-the-counter and prescription medicines only as told by your health care provider.  If you were prescribed an antibiotic medicine, take it as told by your health care provider. Do not stop taking the antibiotic even if your condition improves. Managing pain and swelling  Try sitz baths to help with   pain and swelling. A sitz bath is a warm water bath in which the water only comes up to your hips and should cover your buttocks. You may take sitz baths several times a day.  Apply heat to the affected area as often as needed. Use the heat source that your health care provider recommends, such as a moist heat pack or a heating pad. ? Place a towel between your skin and the heat source. ? Leave the heat on for 20-30 minutes. ? Remove the heat if your skin turns  bright red. This is especially important if you are unable to feel pain, heat, or cold. You may have a greater risk of getting burned. General instructions  If your cyst or abscess was drained, follow instructions from your health care provider about how to take care of your wound. Use feminine pads as needed to absorb any drainage.  Do not push on or squeeze your cyst.  Do not have sex until the cyst has gone away or your wound from drainage has healed.  Take these steps to help prevent a Bartholin's cyst from returning, and to prevent other Bartholin's cysts from developing: ? Take a bath or shower once a day. Clean your vaginal area with mild soap and water when you bathe. ? Practice safe sex to prevent STIs. Talk with your health care provider about how to prevent STIs and which forms of birth control (contraception) may be best for you.  Keep all follow-up visits as told by your health care provider. This is important. Contact a health care provider if:  You have a fever.  You develop redness, swelling, or pain around your cyst.  You have fluid, blood, pus, or a bad smell coming from your cyst.  You have a cyst that gets larger or comes back. Summary  A Bartholin's cyst is a fluid-filled sac that forms on a Bartholin's gland. These glands are in the folds of skin around the vaginal opening (labia).  If your cyst is small, not infected, and not causing symptoms, you may not need any treatment. These cysts often go away on their own, with home care such as hot baths or warm compresses.  If you have a large cyst or an abscess, your health care provider may perform a procedure to drain the fluid.  If you have cysts or abscesses that keep returning (recurring) and have required incision and drainage multiple times, your health care provider may talk with you about surgery to remove the Bartholin's gland. This information is not intended to replace advice given to you by your health  care provider. Make sure you discuss any questions you have with your health care provider. Document Revised: 08/06/2018 Document Reviewed: 07/16/2017 Elsevier Patient Education  2020 Elsevier Inc.  

## 2020-05-08 LAB — GC/CHLAMYDIA PROBE AMP (~~LOC~~) NOT AT ARMC
Chlamydia: NEGATIVE
Comment: NEGATIVE
Comment: NORMAL
Neisseria Gonorrhea: NEGATIVE

## 2020-05-10 LAB — ANAEROBIC CULTURE

## 2020-05-11 ENCOUNTER — Ambulatory Visit: Payer: Self-pay

## 2020-05-11 NOTE — Lactation Note (Signed)
This note was copied from a baby's chart. Lactation Consultation Note  Patient Name: Sally Wiley [id number] Date: 05/11/2020    Lactation rounded today. Mom not in room, but dad was in the room. Baby is ready to begin breast feeding per RN. Mom set up an appointment for lactation for Monday July 19 at 0900.  RN reports (also confirmed in the sticky note) that mom is pumping and dumping due to oxycodone use. Lactation not aware of this change or aware of her dosage. Plan to follow up at scheduled appointment on Monday.   Walker Shadow 05/11/2020, 4:01 PM

## 2020-05-15 ENCOUNTER — Ambulatory Visit: Payer: Self-pay

## 2020-05-15 NOTE — Lactation Note (Signed)
This note was copied from a baby's chart. Lactation Consultation Note  Patient Name: Sally Wiley [id number] Date: 05/15/2020 Reason for consult: Follow-up assessment;Mother's request;NICU baby  I followed up with Sally Wiley this afternoon. Her RN paged. I provided information on the use of oxycodone while pumping. Sally Wiley has discontinued this medication and resumed her pumping. I assisted with latching Sally Wiley in cross cradle hold on the left breast.  Wiley self latched; initially it was shallow, but with a few moments of suckling, he dropped his jaw to a wide latch with flanged lips. Baby had good depth at the breast. He fed for about 6 minutes until becoming sleepy. At that time, he remained latched but became non nutritive. Eventually he released the breast.   I encouraged Sally Wiley to offer the breast first and supplement after. Praised her and baby Sally Wiley for such good progress at only 35 weeks. We discussed developmental readiness for baby to conduct full feedings at the breast. Baby will be supplemented by bottle after.  I also encouraged Sally Wiley to continue to post-pump to maintain her milk volume.   Interventions Interventions: Breast feeding basics reviewed;Support pillows;Adjust position;Breast compression   Consult Status Consult Status: Follow-up Date: 05/18/20 Follow-up type: In-patient    Walker Shadow 05/15/2020, 6:37 PM

## 2020-05-15 NOTE — Lactation Note (Signed)
This note was copied from a baby's chart. Lactation Consultation Note  Patient Name: Sally Wiley [id number] Date: 05/15/2020   [facility] asked to check on [name] today. [name] not present in the room, so I called her. She had a procedure last Monday 7/12 and received antibiotics (she could not recall the name) and seven (7) 500 mg oxycodone. She states that she had two (2) percocet on the day of her procedure. She was advised by a provider last week to pump and dump while taking the Oxycodone. Now that she has ceased with the oxycodone she will no longer pump and dump. I recommended that prior to dumping milk in question to call our lactation number to check safety of milk recommended for specific medication and dosage and age of baby. We discussed labelling milk well and saving for later as milk is usable for up to 6 months in a freezer.  [name] verbalized understanding. She will be coming back later today and tomorrow and will call lactation for follow up in person.   Walker Shadow 05/15/2020, 11:48 AM

## 2020-05-19 ENCOUNTER — Ambulatory Visit: Payer: Self-pay

## 2020-05-19 NOTE — Lactation Note (Signed)
This note was copied from a baby's chart. Lactation Consultation Note  Patient Name: Sally Wiley [id number] Date: 05/19/2020   Lactation attempted to visit today, Mom not present.  [name] 05/19/2020, 3:26 PM

## 2020-05-25 ENCOUNTER — Ambulatory Visit (INDEPENDENT_AMBULATORY_CARE_PROVIDER_SITE_OTHER): Payer: BLUE CROSS/BLUE SHIELD | Admitting: Obstetrics & Gynecology

## 2020-05-25 ENCOUNTER — Encounter: Payer: Self-pay | Admitting: Obstetrics & Gynecology

## 2020-05-25 ENCOUNTER — Other Ambulatory Visit: Payer: Self-pay

## 2020-05-25 ENCOUNTER — Ambulatory Visit: Payer: BLUE CROSS/BLUE SHIELD | Admitting: Obstetrics and Gynecology

## 2020-05-25 DIAGNOSIS — Z3009 Encounter for other general counseling and advice on contraception: Secondary | ICD-10-CM

## 2020-05-25 NOTE — Patient Instructions (Signed)
Bartholin's Cyst  A Bartholin's cyst is a fluid-filled sac that forms on a Bartholin's gland. Bartholin's glands are small glands in the folds of skin around the vaginal opening (labia). These glands produce a fluid to moisten (lubricate) the outside of the vagina during sex. A cyst that is not large or infected may not cause any problems or require treatment. If the cyst gets infected, it is called a Bartholin's abscess. An abscess may cause symptoms such as pain and swelling and is more likely to require treatment. What are the causes? This condition may be caused by a blocked Bartholin's gland. These glands can become blocked due to natural buildup of fluid and oils. Bacteria inside of the cyst can cause infection. In many cases, the cause is not known. What increases the risk? You may be at increased risk of developing a Bartholin's cyst or abscess if:  You are of childbearing age.  You have a history of Bartholin's cysts or abscesses.  You have diabetes.  You have an STI (sexually transmitted infection). What are the signs or symptoms? Symptoms may include:  A bulge or lump on the labia, near the lower opening of the vagina.  Discomfort or pain. This may get worse during sex or when walking.  Redness, swelling, or fluid draining from the area. These may be signs of an abscess. How severe your symptoms are depends on the size of your cyst and whether it is infected. Infection causes symptoms to get more severe. How is this diagnosed? This condition may be diagnosed based on:  Your symptoms and medical history.  A physical exam to check for swelling in your vaginal area. You may lie on your back on an exam table and have your feet placed into footrests for the exam.  Blood tests to check for infections.  Removal of a fluid sample from the cyst or abscess (biopsy) for testing. You may work with a health care provider who specializes in women's health (gynecologist) for diagnosis  and treatment. How is this treated? If your cyst is small, not infected, and not causing symptoms, you may not need any treatment. These cysts often go away on their own, with home care such as hot baths or warm compresses. If you have a large cyst or an abscess, treatment may include:  Antibiotic medicine.  A procedure to drain the fluid inside the cyst or abscess. These procedures involve making an incision in the cyst or abscess so that the fluid drains out, and then one of the following may be done: ? A small, thin tube (catheter) may be placed inside the cyst or abscess so that it does not close and fill up with fluid again (fistulization). The catheter will be removed at a follow-up visit. ? The edges of the incision may be stitched to your skin so that the cyst or abscess stays open (marsupialization). This allows it to continue to drain and not fill up with fluid again. If you have cysts or abscesses that keep returning (recurring) and have required incision and drainage multiple times, your health care provider may talk with you about surgery to remove the Bartholin's gland. Follow these instructions at home: Medicines  Take over-the-counter and prescription medicines only as told by your health care provider.  If you were prescribed an antibiotic medicine, take it as told by your health care provider. Do not stop taking the antibiotic even if your condition improves. Managing pain and swelling  Try sitz baths to help with   pain and swelling. A sitz bath is a warm water bath in which the water only comes up to your hips and should cover your buttocks. You may take sitz baths several times a day.  Apply heat to the affected area as often as needed. Use the heat source that your health care provider recommends, such as a moist heat pack or a heating pad. ? Place a towel between your skin and the heat source. ? Leave the heat on for 20-30 minutes. ? Remove the heat if your skin turns  bright red. This is especially important if you are unable to feel pain, heat, or cold. You may have a greater risk of getting burned. General instructions  If your cyst or abscess was drained, follow instructions from your health care provider about how to take care of your wound. Use feminine pads as needed to absorb any drainage.  Do not push on or squeeze your cyst.  Do not have sex until the cyst has gone away or your wound from drainage has healed.  Take these steps to help prevent a Bartholin's cyst from returning, and to prevent other Bartholin's cysts from developing: ? Take a bath or shower once a day. Clean your vaginal area with mild soap and water when you bathe. ? Practice safe sex to prevent STIs. Talk with your health care provider about how to prevent STIs and which forms of birth control (contraception) may be best for you.  Keep all follow-up visits as told by your health care provider. This is important. Contact a health care provider if:  You have a fever.  You develop redness, swelling, or pain around your cyst.  You have fluid, blood, pus, or a bad smell coming from your cyst.  You have a cyst that gets larger or comes back. Summary  A Bartholin's cyst is a fluid-filled sac that forms on a Bartholin's gland. These glands are in the folds of skin around the vaginal opening (labia).  If your cyst is small, not infected, and not causing symptoms, you may not need any treatment. These cysts often go away on their own, with home care such as hot baths or warm compresses.  If you have a large cyst or an abscess, your health care provider may perform a procedure to drain the fluid.  If you have cysts or abscesses that keep returning (recurring) and have required incision and drainage multiple times, your health care provider may talk with you about surgery to remove the Bartholin's gland. This information is not intended to replace advice given to you by your health  care provider. Make sure you discuss any questions you have with your health care provider. Document Revised: 08/06/2018 Document Reviewed: 07/16/2017 Elsevier Patient Education  2020 Elsevier Inc.  

## 2020-05-25 NOTE — Progress Notes (Signed)
    Post Partum Visit Note  Sally Wiley is a 34 y.o. G86P0201 female who presents for a postpartum visit. She is 6 weeks postpartum following a normal spontaneous vaginal delivery.  I have fully reviewed the prenatal and intrapartum course. The delivery was at 27 gestational weeks.  Anesthesia: epidural. Postpartum course has been complicated by preterm delivery. Baby is doing well but complicated by preterm delivery. Baby is still in NICU - just needs to get feeding schedule good and established. Baby is feeding by both breast (pumped) and formula. Bleeding moderate lochia LMP Monday July 26,2021 . Bowel function is normal. Bladder function is normal. Patient is not sexually active. Contraception method is Depo-Provera injections. Postpartum depression screening: negative.   Pt currently lives alone. Will move in with her Grandmother to help her care for the baby.   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 She has had drainage 8 times. Some on the right and some on the left.   The pregnancy intention screening data noted above was reviewed. Potential methods of contraception were discussed. The patient elected to proceed with Hormonal Injection.   Review of Systems Pertinent items are noted in HPI.    Objective:  unknown if currently breastfeeding.  BP 120/68   Pulse 69   Wt 128 lb (58.1 kg)   BMI 22.67 kg/m   CONSTITUTIONAL: Well-developed, well-nourished female in no acute distress.  HENT:  Normocephalic, atraumatic EYES: Conjunctivae and EOM are normal. No scleral icterus.  NECK: Normal range of motion SKIN: Skin is warm and dry. No rash noted. Not diaphoretic.No pallor. NEUROLGIC: Alert and oriented to person, place, and time. Normal coordination.  GU: EGBUS: no lesions; no evidence of swollen Bartholin's gland Vagina: no blood in vault  Assessment:    6 weeks postpartum exam.  Doing well Contraception counseling. S/p Depo Provera PP   Plan:    Essential components of care per ACOG recommendations:  1.  Mood and well being: Patient with negative depression screening today. Reviewed local resources for support.   2. Infant care and feeding:  -Patient currently breastmilk feeding? Yes If breastmilk feeding discussed return to work and pumping. If needed, patient was provided letter for work to allow for every 2-3 hr pumping breaks, and to be granted a private location to express breastmilk and refrigerated area to store breastmilk. Reviewed importance of draining breast regularly to support lactation. -Social determinants of health (SDOH) reviewed in EPIC. No concerns  3. Sexuality, contraception and birth spacing - Patient does not want a pregnancy in the next year.  Patient desired Depo-Provera. Received in the hosp.   - Discussed birth spacing of 18 months  4. Sleep and fatigue -Encouraged family/partner/community support of 4 hrs of uninterrupted sleep to help with mood and fatigue  5. Physical Recovery  - Discussed patients delivery  6.  Health Maintenance - Last pap smear done 04/27/2019 and was normal with negative HPV.  Matti Minney L. Harraway-Smith, M.D., [facility] for [employer], [facility]

## 2020-05-31 ENCOUNTER — Ambulatory Visit: Payer: Self-pay

## 2020-05-31 NOTE — Lactation Note (Signed)
This note was copied from a baby's chart. Lactation Consultation Note  Patient Name: Sally Wiley [id number] Date: 05/31/2020 Reason for consult: Follow-up assessment;NICU baby;Early term 37-38.6wks  LC in to speak with P1 Mom of 6 wk old infant in the NICU.  Baby's AGA is [redacted]w[redacted]d and weight today is 6 lbs 1oz.    Mom holding baby and bottle feeding formula.  Baby hasn't had EBM since 7/31.  Asked Mom is she was doing ok with pumping.  Has a [employer] DEBP at home, and pump not set up at bedside.  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  Mom states she just pumped this morning, but did not bring in her EBM, she obtained 1-2 oz.   Encouraged Mom to bring pump parts with her to pump at baby's bedside, and to increase frequency as best she can.    Breasts are not engorged.  Mom knows to call for lactation assistance prn.   Lactation Tools Discussed/Used Tools: Pump Breast pump type: Double-Electric Breast Pump WIC Program: Yes   Consult Status Consult Status: Follow-up Date: 06/05/20 Follow-up type: In-patient    [name] 05/31/2020, 9:18 AM

## 2020-07-03 ENCOUNTER — Ambulatory Visit: Payer: BLUE CROSS/BLUE SHIELD

## 2020-07-04 ENCOUNTER — Ambulatory Visit (INDEPENDENT_AMBULATORY_CARE_PROVIDER_SITE_OTHER): Payer: BLUE CROSS/BLUE SHIELD

## 2020-07-04 ENCOUNTER — Other Ambulatory Visit: Payer: Self-pay

## 2020-07-04 ENCOUNTER — Other Ambulatory Visit (HOSPITAL_BASED_OUTPATIENT_CLINIC_OR_DEPARTMENT_OTHER): Payer: Self-pay | Admitting: Family Medicine

## 2020-07-04 VITALS — BP 114/76 | HR 72

## 2020-07-04 DIAGNOSIS — Z3042 Encounter for surveillance of injectable contraceptive: Secondary | ICD-10-CM | POA: Diagnosis not present

## 2020-07-04 MED ORDER — MEDROXYPROGESTERONE ACETATE 150 MG/ML IM SUSP
150.0000 mg | INTRAMUSCULAR | 0 refills | Status: DC
Start: 2020-07-04 — End: 2021-05-02

## 2020-07-04 MED ORDER — MEDROXYPROGESTERONE ACETATE 150 MG/ML IM SUSP
150.0000 mg | INTRAMUSCULAR | 0 refills | Status: DC
Start: 2020-07-04 — End: 2020-07-04

## 2020-07-04 MED ORDER — MEDROXYPROGESTERONE ACETATE 150 MG/ML IM SUSP
150.0000 mg | Freq: Once | INTRAMUSCULAR | Status: AC
  Administered 2020-07-04: 150 mg via INTRAMUSCULAR

## 2020-07-04 NOTE — Progress Notes (Signed)
Patient came today for her depo-provera injection.  Given in right arm IM HCG Serum: N/A  NDC # [phone] Side Effects: None at this time

## 2020-09-25 ENCOUNTER — Ambulatory Visit (INDEPENDENT_AMBULATORY_CARE_PROVIDER_SITE_OTHER): Payer: BLUE CROSS/BLUE SHIELD

## 2020-09-25 ENCOUNTER — Other Ambulatory Visit: Payer: Self-pay

## 2020-09-25 VITALS — BP 114/72 | HR 80

## 2020-09-25 DIAGNOSIS — Z3042 Encounter for surveillance of injectable contraceptive: Secondary | ICD-10-CM | POA: Diagnosis not present

## 2020-09-25 MED ORDER — MEDROXYPROGESTERONE ACETATE 150 MG/ML IM SUSP
150.0000 mg | Freq: Once | INTRAMUSCULAR | Status: AC
  Administered 2020-09-25: 150 mg via INTRAMUSCULAR

## 2020-09-25 MED FILL — MEDROXYPROGESTERONE ACETATE: 150 | 90 days supply | Qty: 1 | Fill #0

## 2020-09-25 NOTE — Progress Notes (Signed)
Patient presents for Depo Provera injection Patient supplied.  Patient to return in 3 months for next injection. Armandina Stammer RN

## 2020-09-25 NOTE — Progress Notes (Signed)
Patient was assessed and managed by nursing staff during this encounter. I have reviewed the chart and agree with the documentation and plan. I have also made any necessary editorial changes.  Jaynie Collins, MD 09/25/2020 11:16 AM

## 2020-12-11 ENCOUNTER — Other Ambulatory Visit: Payer: Self-pay

## 2020-12-11 ENCOUNTER — Other Ambulatory Visit (HOSPITAL_BASED_OUTPATIENT_CLINIC_OR_DEPARTMENT_OTHER): Payer: Self-pay | Admitting: Obstetrics & Gynecology

## 2020-12-11 ENCOUNTER — Ambulatory Visit: Payer: BLUE CROSS/BLUE SHIELD | Attending: Internal Medicine

## 2020-12-11 ENCOUNTER — Ambulatory Visit (INDEPENDENT_AMBULATORY_CARE_PROVIDER_SITE_OTHER): Payer: BLUE CROSS/BLUE SHIELD

## 2020-12-11 VITALS — BP 110/72 | HR 70

## 2020-12-11 DIAGNOSIS — Z23 Encounter for immunization: Secondary | ICD-10-CM

## 2020-12-11 DIAGNOSIS — Z3042 Encounter for surveillance of injectable contraceptive: Secondary | ICD-10-CM | POA: Diagnosis not present

## 2020-12-11 MED ORDER — MEDROXYPROGESTERONE ACETATE 150 MG/ML IM SUSP
150.0000 mg | INTRAMUSCULAR | 3 refills | Status: DC
Start: 2020-12-11 — End: 2021-05-02

## 2020-12-11 MED ORDER — MEDROXYPROGESTERONE ACETATE 150 MG/ML IM SUSP
150.0000 mg | INTRAMUSCULAR | Status: AC
  Administered 2020-12-11: 150 mg via INTRAMUSCULAR

## 2020-12-11 MED FILL — medroxyPROGESTERone ACETATE: 150 | 90 days supply | Qty: 1 | Fill #0

## 2020-12-11 NOTE — Progress Notes (Addendum)
Patient presents for Depo Provera injection.  Patient is up to date on pap and needs annual in July 2022. Patient will scheduled next injection in three months. Armandina Stammer RN   Attestation of Attending Supervision of RN: Evaluation and management procedures were performed by the nurse under my supervision and collaboration.  I have reviewed the nursing note and chart, and I agree with the management and plan.  Carolyn L. Harraway-Smith, M.D., [facility]

## 2020-12-11 NOTE — Progress Notes (Signed)
   Covid-19 Vaccination Clinic  Name:  NALIAH EDDINGTON    MRN: [id number] DOB: [birth year]  12/11/2020  Ms. Provencal was observed post Covid-19 immunization for 15 minutes without incident. She was provided with Vaccine Information Sheet and instruction to access the V-Safe system.   Ms. Affinito was instructed to call 911 with any severe reactions post vaccine: Marland Kitchen Difficulty breathing  . Swelling of face and throat  . A fast heartbeat  . A bad rash all over body  . Dizziness and weakness   Immunizations Administered    Name Date Dose VIS Date Route   Moderna COVID-19 Vaccine 12/11/2020 10:32 AM 0.5 mL 08/16/2020 Intramuscular   Manufacturer: Moderna   Lot: [id number]   NDC: 27618-485-92

## 2021-02-26 ENCOUNTER — Ambulatory Visit: Payer: BLUE CROSS/BLUE SHIELD

## 2021-05-02 ENCOUNTER — Ambulatory Visit (INDEPENDENT_AMBULATORY_CARE_PROVIDER_SITE_OTHER): Payer: BLUE CROSS/BLUE SHIELD | Admitting: Family Medicine

## 2021-05-02 ENCOUNTER — Encounter: Payer: Self-pay | Admitting: Family Medicine

## 2021-05-02 ENCOUNTER — Other Ambulatory Visit: Payer: Self-pay

## 2021-05-02 VITALS — BP 122/73 | HR 73 | Ht 63.0 in | Wt 122.0 lb

## 2021-05-02 DIAGNOSIS — Z3202 Encounter for pregnancy test, result negative: Secondary | ICD-10-CM | POA: Diagnosis not present

## 2021-05-02 DIAGNOSIS — Z01419 Encounter for gynecological examination (general) (routine) without abnormal findings: Secondary | ICD-10-CM

## 2021-05-02 DIAGNOSIS — Z3009 Encounter for other general counseling and advice on contraception: Secondary | ICD-10-CM

## 2021-05-02 LAB — POCT URINE PREGNANCY: Preg Test, Ur: NEGATIVE

## 2021-05-02 NOTE — Progress Notes (Signed)
Pt presents today for yearly exam. LMP: 04/18/21. BCM: none. Pt would like to go back on Depo. Will obtain first upt today and pt will need to return in 10 days for repeat. Pt had unprotected intercourse 3 days ago.

## 2021-05-02 NOTE — Progress Notes (Signed)
GYNECOLOGY ANNUAL PREVENTATIVE CARE ENCOUNTER NOTE  Subjective:   Sally Wiley is a 35 y.o. G66P0201 female here for a routine annual gynecologic exam.  Current complaints: none.   Denies abnormal vaginal bleeding, discharge, pelvic pain, problems with intercourse or other gynecologic concerns.    Gynecologic History Patient's last menstrual period was 04/18/2021 (exact date). Patient is  sexually active  Contraception: none - wants to get back on depo Last Pap: 2020. Results were: normal Last mammogram: n/a.  Obstetric History OB History  Gravida Para Term Preterm AB Living  2 2   2   1   SAB IAB Ectopic Multiple Live Births        0 1    # Outcome Date GA Lbr Len/2nd Weight Sex Delivery Anes PTL Lv  2 Preterm 04/17/20 105w2d 01:56 / 00:14 3 lb 8.8 oz (1.61 kg) M Vag-Spont EPI  LIV  1 Preterm 06/22/19 [redacted]w[redacted]d  15.5 oz (0.439 kg) M   Y FD    Obstetric Comments  Had cervical incompetence with first pregnancy, delivered at 22wks, breech with head entrapment with subsequent Duhrssen's Incision and cervical repair. Occurred at [facility].    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   Bartholin's gland abscess 09/13/2011   Supervision of other normal pregnancy, antepartum 11/25/2019   BABYSCRIPTS PATIENT: [ ] Initial [x] 12 [x]  15 [ ] 20 [ ] 28 [ ] 32 [ ] 36 [ ] 38 [ ] 39 [ ] 40 Nursing Staff Provider Office Location [facility]  Dating   Language  English  Anatomy 11/27/2019   Flu Vaccine  Declined 11/25/19 Genetic Screen  NIPS: low risk   AFP Neg TDaP vaccine   04-03-20 Hgb A1C or  GTT Early  Third trimester: Normal    Glucose, Fasting 65 - 91 mg/dL 75  Glucose, 1 hour 65 - 179 mg/dL  Glucose,     Past Surgical History:  Procedure Laterality Date   CERVICAL CERCLAGE N/A 12/16/2019   Procedure: CERCLAGE CERVICAL;  Surgeon: , MD;  Location: [facility] LD ORS;  Service: Gynecology;  Laterality: N/A;   DILATION AND CURETTAGE OF UTERUS      No  current outpatient medications on file prior to visit.   Current Facility-Administered Medications on File Prior to Visit  Medication Dose Route Frequency Provider Last Rate Last Admin   medroxyPROGESTERone (DEPO-PROVERA) injection 150 mg  150 mg Intramuscular Q90 days , MD   150 mg at 12/11/20 1023    Allergies  Allergen Reactions   Shellfish Allergy Hives    Social History   Socioeconomic History   Marital status: Single    Spouse name: Not on file   Number of children: Not on file   Years of education: Not on file   Highest education level: Not on file  Occupational History   Not on file  Tobacco Use   Smoking status: Never   Smokeless tobacco: Never  Vaping Use   Vaping Use: Never used  Substance and Sexual Activity   Alcohol use: Not Currently    Comment: socailly   Drug use: Never   Sexual activity: Yes  Other Topics Concern   Not on file  Social History Narrative   Not on file   Social Determinants of Health   Financial Resource Strain: Not on file  Food Insecurity: Not on file  Transportation Needs: Not on file  Physical Activity: Not on file  Stress: Not on file  Social Connections: Not  on file  Intimate Partner Violence: Not on file    Family History  Problem Relation Age of Onset   Cancer Paternal Grandmother    Heart disease Mother    Hypertension Mother    Diabetes Neg Hx     The following portions of the patient's history were reviewed and updated as appropriate: allergies, current medications, past family history, past medical history, past social history, past surgical history and problem list.  Review of Systems Pertinent items are noted in HPI.   Objective:  BP 122/73 (BP Location: Right Arm, Patient Position: Sitting, Cuff Size: Normal)   Pulse 73   Ht 5\' 3"  (1.6 m)   Wt 122 lb (55.3 kg)   LMP 04/18/2021 (Exact Date)   Breastfeeding No   BMI 21.61 kg/m  Wt Readings from Last 3 Encounters:  05/02/21 122 lb  (55.3 kg)  05/25/20 128 lb (58.1 kg)  05/07/20 127 lb 4.8 oz (57.7 kg)     Chaperone present during exam  CONSTITUTIONAL: Well-developed, well-nourished female in no acute distress.  HENT:  Normocephalic, atraumatic, External right and left ear normal. Oropharynx is clear and moist EYES: Conjunctivae and EOM are normal. Pupils are equal, round, and reactive to light. No scleral icterus.  NECK: Normal range of motion, supple, no masses.  Normal thyroid.   CARDIOVASCULAR: Normal heart rate noted, regular rhythm RESPIRATORY: Clear to auscultation bilaterally. Effort and breath sounds normal, no problems with respiration noted. BREASTS: Symmetric in size. No masses, skin changes, nipple drainage, or lymphadenopathy. ABDOMEN: Soft, normal bowel sounds, no distention noted.  No tenderness, rebound or guarding.  PELVIC: Normal appearing external genitalia; normal appearing vaginal mucosa and cervix.  No abnormal discharge noted.   MUSCULOSKELETAL: Normal range of motion. No tenderness.  No cyanosis, clubbing, or edema.  2+ distal pulses. SKIN: Skin is warm and dry. No rash noted. Not diaphoretic. No erythema. No pallor. NEUROLOGIC: Alert and oriented to person, place, and time. Normal reflexes, muscle tone coordination. No cranial nerve deficit noted. PSYCHIATRIC: Normal mood and affect. Normal behavior. Normal judgment and thought content.  Assessment:  Annual gynecologic examination with pap smear   Plan:  1. Well Woman Exam PAP up to date STD testing discussed. Patient declined testing  2. Birth control counseling Return in 10 days for second UPT and Depo - POCT urine pregnancy   Routine preventative health maintenance measures emphasized. Please refer to After Visit Summary for other counseling recommendations.    07/08/20, DO [facility]

## 2021-05-15 ENCOUNTER — Ambulatory Visit (INDEPENDENT_AMBULATORY_CARE_PROVIDER_SITE_OTHER): Payer: Self-pay

## 2021-05-15 ENCOUNTER — Other Ambulatory Visit (HOSPITAL_BASED_OUTPATIENT_CLINIC_OR_DEPARTMENT_OTHER): Payer: Self-pay

## 2021-05-15 ENCOUNTER — Other Ambulatory Visit: Payer: Self-pay

## 2021-05-15 VITALS — BP 108/70 | HR 59 | Wt 122.0 lb

## 2021-05-15 DIAGNOSIS — Z3040 Encounter for surveillance of contraceptives, unspecified: Secondary | ICD-10-CM

## 2021-05-15 LAB — POCT URINALYSIS DIPSTICK OB
Bilirubin, UA: NEGATIVE
Blood, UA: NEGATIVE
Glucose, UA: NEGATIVE
Ketones, UA: NEGATIVE
Leukocytes, UA: NEGATIVE
Nitrite, UA: NEGATIVE
POC,PROTEIN,UA: NEGATIVE
Spec Grav, UA: 1.015 (ref 1.010–1.025)
pH, UA: 6.5 (ref 5.0–8.0)

## 2021-05-15 MED ORDER — MEDROXYPROGESTERONE ACETATE 150 MG/ML IM SUSY
150.0000 mg | PREFILLED_SYRINGE | INTRAMUSCULAR | 3 refills | Status: DC
Start: 2021-05-15 — End: 2022-01-17
  Filled 2021-05-15: qty 1, 90d supply, fill #0

## 2021-05-15 MED ORDER — MEDROXYPROGESTERONE ACETATE 150 MG/ML IM SUSP
150.0000 mg | Freq: Once | INTRAMUSCULAR | Status: AC
  Administered 2021-05-15: 150 mg via INTRAMUSCULAR

## 2021-05-15 NOTE — Progress Notes (Signed)
Patient was assessed and managed by nursing staff during this encounter. I have reviewed the chart and agree with the documentation and plan. I have also made any necessary editorial changes.  Weslynn Ke L Esme Durkin, CNM 05/15/2021 3:09 PM   

## 2021-05-15 NOTE — Progress Notes (Signed)
Sally Wiley here for her 2nd UPT and to restart  Depo-Provera  Injection. UPT; negative. Injection administered without complication. Patient will return in 3 months for next injection.  Brazil Voytko l Arturo Freundlich, CMA 05/15/2021  1:53 PM

## 2021-08-06 ENCOUNTER — Ambulatory Visit: Payer: BLUE CROSS/BLUE SHIELD

## 2021-12-04 IMAGING — US US MFM OB FOLLOW-UP
1 series · 13 of 28 positions shown · non-contrast
Comparison: none

[Series 1: us mfm ob follow-up · 13 of 44 slices shown]
[im 2/44]
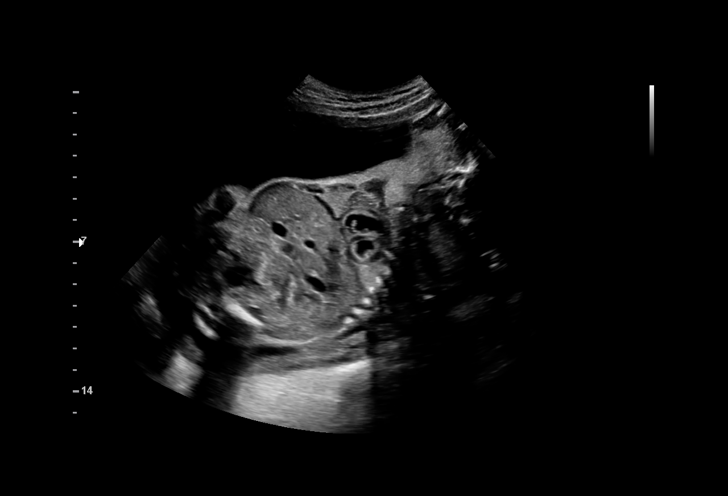
[im 5/44]
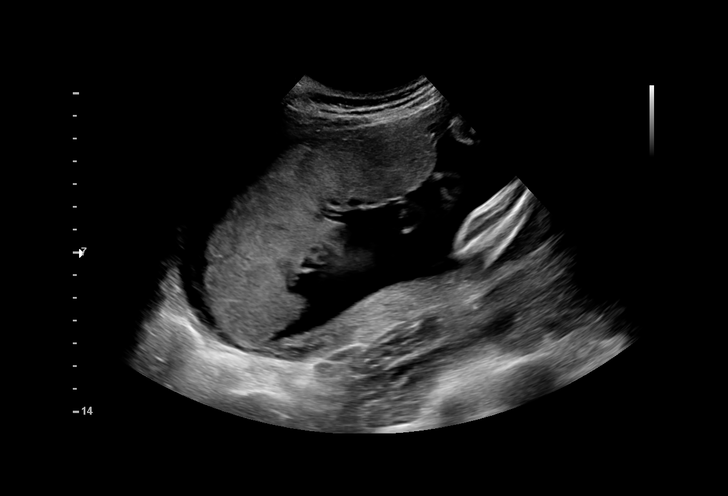
[im 8/44]
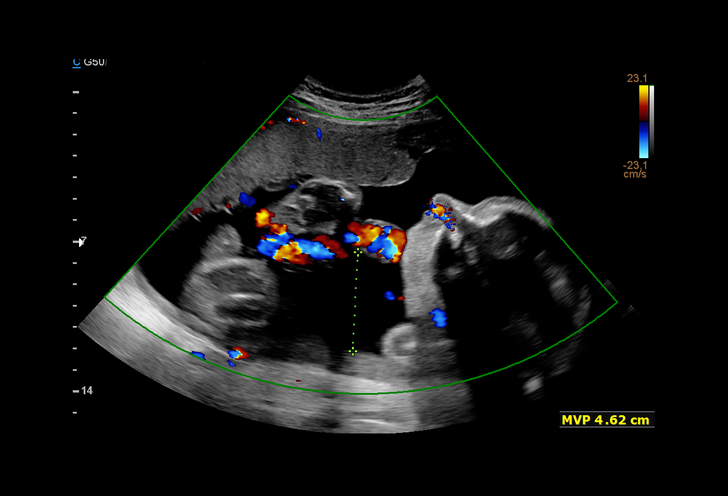
[im 12/44]
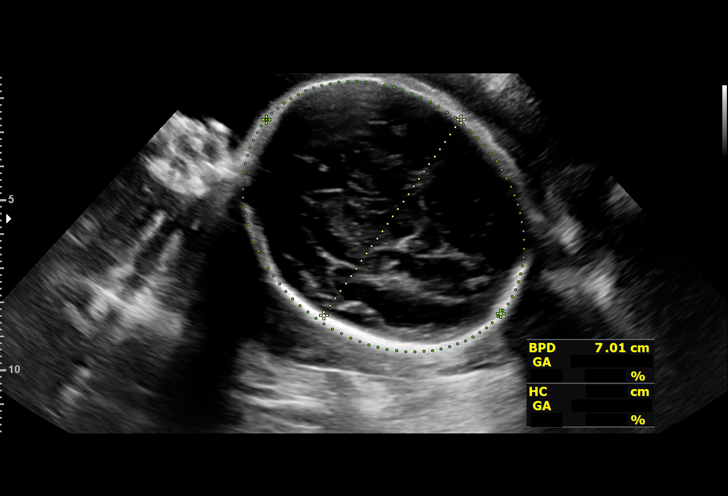
[im 15/44]
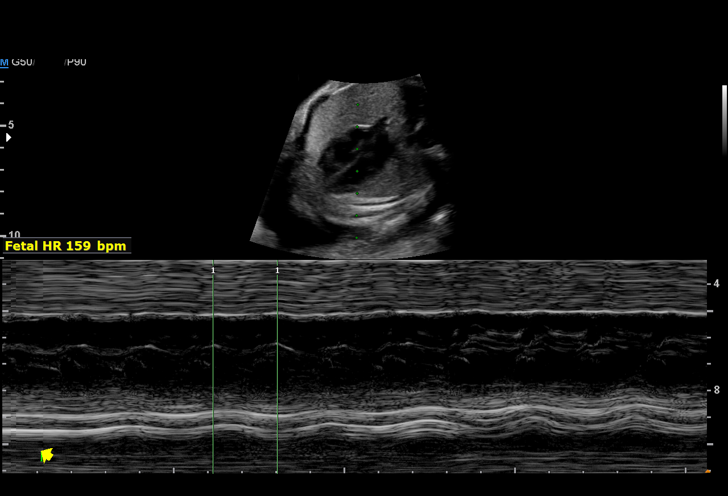
[im 18/44]
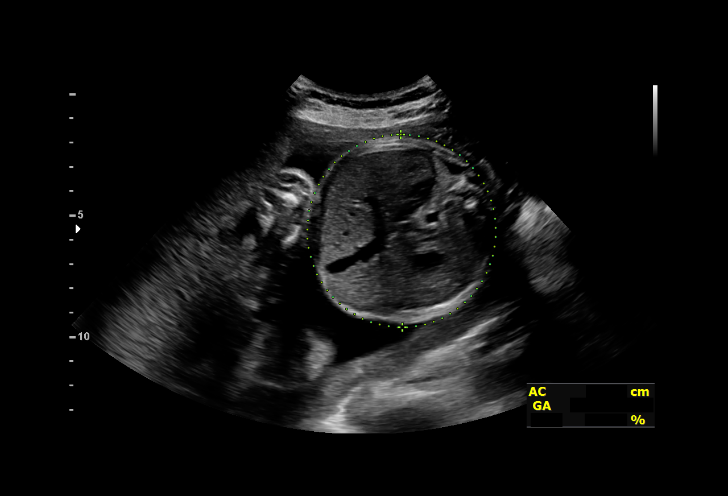
[im 23/44]
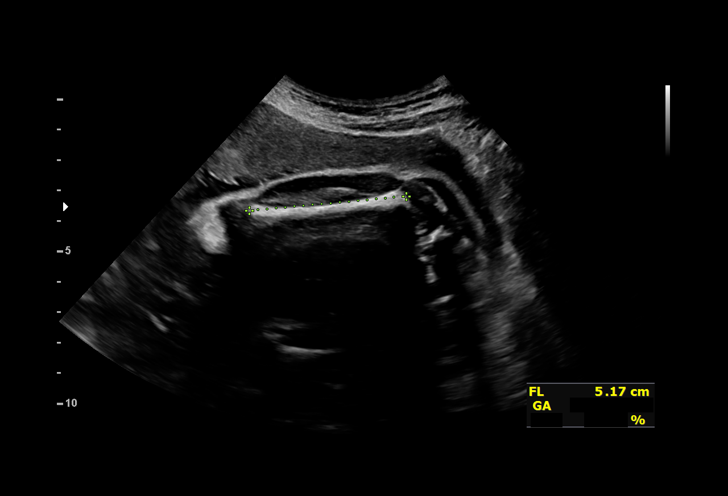
[im 26/44]
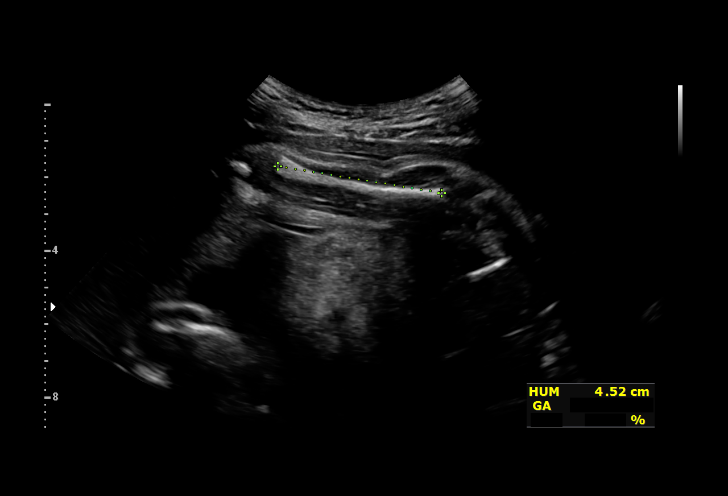
[im 29/44]
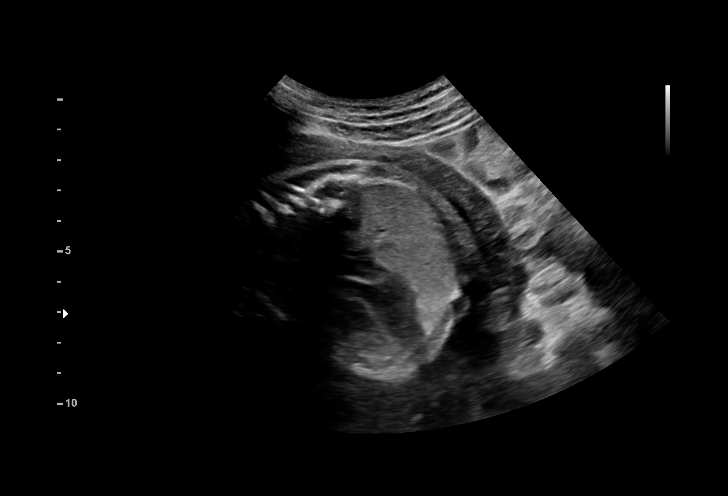
[im 32/44]
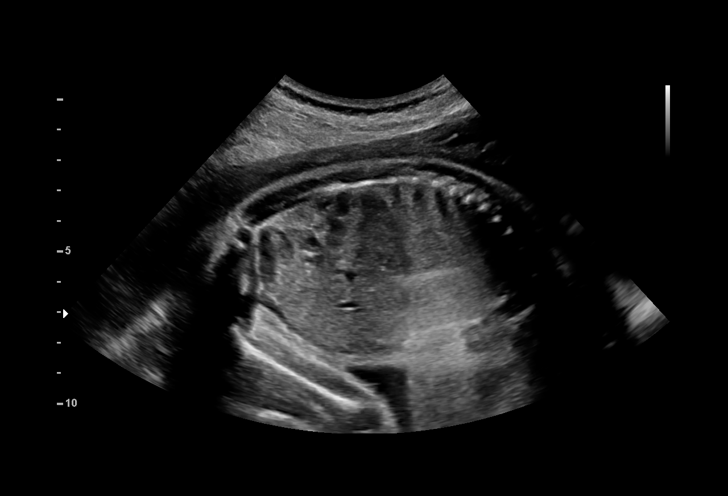
[im 36/44]
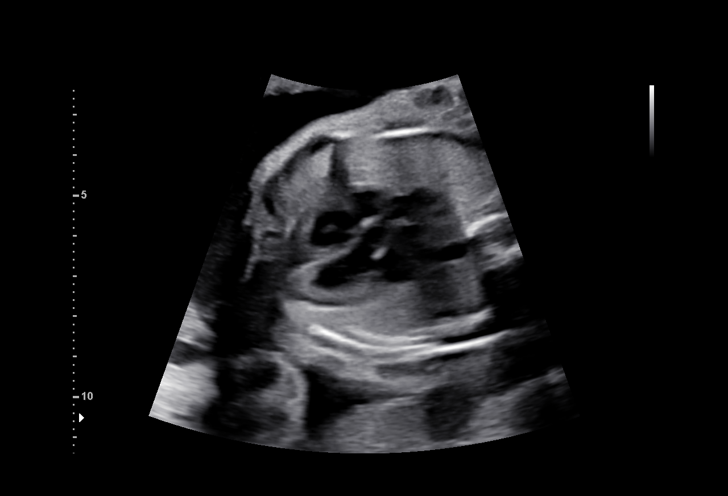
[im 39/44]
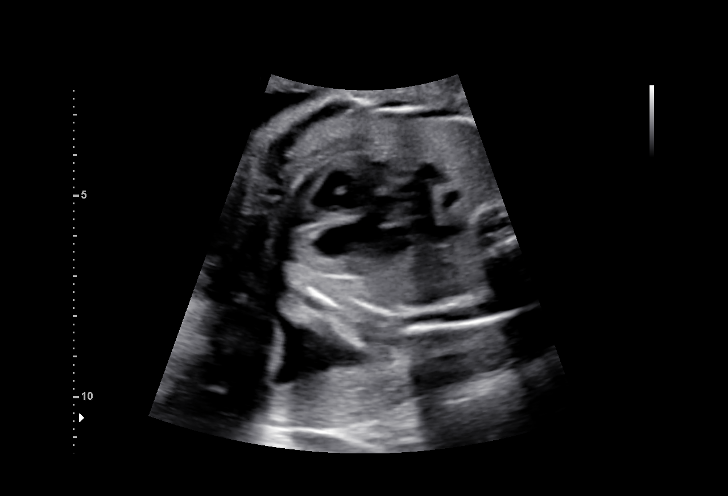
[im 42/44]
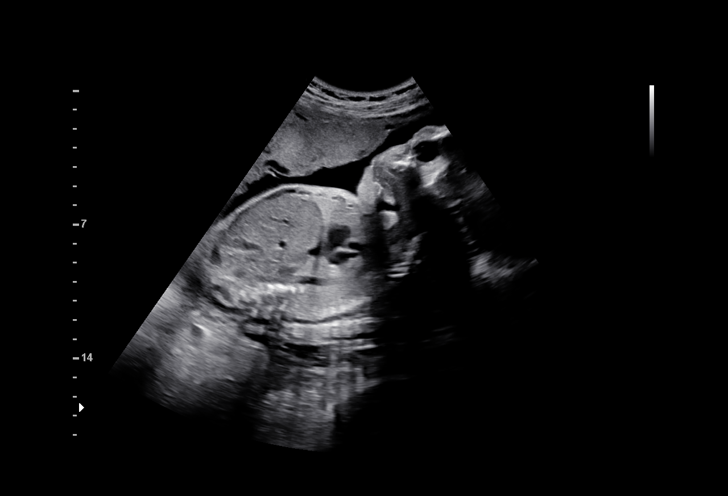

[13 of 28 positions shown; findings below may reference images not displayed]

Indications

 Cervical cerclage suture present, second
 trimester
 Cervical insufficiency, 2nd
 Poor obstetric history: Previous midtrimester
 loss (22 week PROM, PTD)
 27 weeks gestation of pregnancy
Fetal Evaluation

 Num Of Fetuses:         1
 Fetal Heart Rate(bpm):  148
 Cardiac Activity:       Observed
 Presentation:           Cephalic
 Placenta:               Anterior
 P. Cord Insertion:      Previously Visualized

 Amniotic Fluid
 AFI FV:      Within normal limits

                             Largest Pocket(cm)

Biometry

 BPD:      70.6  mm     G. Age:  28w 2d         55  %    CI:        76.78   %    70 - 86
                                                         FL/HC:      19.9   %    18.8 -
 HC:      255.2  mm     G. Age:  27w 5d         17  %    HC/AC:      1.04        1.05 -
 AC:      245.8  mm     G. Age:  28w 6d         72  %    FL/BPD:     72.1   %    71 - 87
 FL:       50.9  mm     G. Age:  27w 2d         20  %    FL/AC:      20.7   %    20 - 24
 HUM:      45.7  mm     G. Age:  27w 0d         27  %
 LV:        3.7  mm

 Est. FW:    9951  gm    2 lb 10 oz      49  %
OB History

 Blood Type:   B+
 Gravidity:    2         Term:   0        Prem:   1        SAB:   0
 TOP:          0       Ectopic:  0        Living: 1
Gestational Age

 LMP:           26w 0d        Date:  09/24/19                 EDD:   06/30/20
 Clinical EDD:  27w 6d                                        EDD:   06/17/20
 U/S Today:     28w 0d                                        EDD:   06/16/20
 Best:          27w 6d     Det. By:  Clinical EDD             EDD:   06/17/20
Anatomy

 Cranium:               Previously seen        Aortic Arch:            Previously seen
 Cavum:                 Previously seen        Ductal Arch:            Previously seen
 Ventricles:            Previously seen        Diaphragm:              Previously seen
 Choroid Plexus:        Previously seen        Stomach:                Previously Seen
 Cerebellum:            Previously seen        Abdomen:                Previously seen
 Posterior Fossa:       Previously seen        Abdominal Wall:         Previously seen
 Nuchal Fold:           Previously seen        Cord Vessels:           Previously seen
 Face:                  Orbits and profile     Kidneys:                Previously seen
                        previously seen
 Lips:                  Previously seen        Bladder:                Previously seen
 Thoracic:              Previously seen        Spine:                  Previously seen
 Heart:                 Previously seen        Upper Extremities:      Previously seen
 RVOT:                  Previously seen        Lower Extremities:      Previously seen
 LVOT:                  Previously seen

 Other:  Male gender. Nasal bone prev. visualized. Open hands
         prev.visualized. Heels and 5th digit prev.visualized. Technically
         difficult due to fetal position.
Cervix Uterus Adnexa

 Cervix
 Not visualized (advanced GA >11wks)

 Uterus
 No abnormality visualized.

 Right Ovary
 Not visualized.

 Left Ovary
 Not visualized.

 Cul De Sac
 No free fluid seen.

 Adnexa
 No adnexal mass visualized.
Comments

 This patient was seen for a follow up growth scan due to a
 history of cervical incompetence.  She has a cervical cerclage
 in place and is receiving weekly 17 P injections.  She denies
 any problems since her last exam and denies feeling any
 contractions or lower abdominal cramping.
 She was informed that the fetal growth and amniotic fluid
 level appears appropriate for her gestational age.
 Funneling of her cervix down to the cerclage suture was
 noted on today's ultrasound.  The remaining cervical length
 measures about 2.1 cm long.
 Preterm labor precautions were discussed with the patient.
 She was advised to call or go to the hospital should she feel
 any lower abdominal cramping or contractions.  She should
 continue the weekly 17 P injections.
 A follow up exam was scheduled in 4 weeks.

## 2021-12-26 ENCOUNTER — Other Ambulatory Visit: Payer: Self-pay

## 2021-12-26 ENCOUNTER — Encounter: Payer: Self-pay | Admitting: General Practice

## 2021-12-26 ENCOUNTER — Ambulatory Visit (INDEPENDENT_AMBULATORY_CARE_PROVIDER_SITE_OTHER): Payer: 59

## 2021-12-26 VITALS — BP 110/67 | HR 80 | Wt 122.0 lb

## 2021-12-26 DIAGNOSIS — Z3201 Encounter for pregnancy test, result positive: Secondary | ICD-10-CM

## 2021-12-26 NOTE — Progress Notes (Signed)
Pt presents for UPT. UPT positive. Pt states she took a Plan B 3 weeks ago. Pt is scheduled for NOB appt on 01/17/22.  ?Tayli Buch l Oswell Say, CMA  ?

## 2022-01-17 ENCOUNTER — Other Ambulatory Visit (HOSPITAL_COMMUNITY)
Admission: RE | Admit: 2022-01-17 | Discharge: 2022-01-17 | Disposition: A | Discharge status: Home / Self Care | Payer: 59 | Source: Ambulatory Visit | Attending: Family Medicine | Admitting: Family Medicine

## 2022-01-17 ENCOUNTER — Encounter: Payer: Self-pay | Admitting: Family Medicine

## 2022-01-17 ENCOUNTER — Ambulatory Visit (INDEPENDENT_AMBULATORY_CARE_PROVIDER_SITE_OTHER): Payer: 59 | Admitting: Family Medicine

## 2022-01-17 ENCOUNTER — Encounter: Payer: Self-pay | Admitting: General Practice

## 2022-01-17 ENCOUNTER — Other Ambulatory Visit: Payer: Self-pay

## 2022-01-17 VITALS — BP 105/60 | HR 91 | Wt 122.0 lb

## 2022-01-17 DIAGNOSIS — S3763XS Laceration of uterus, sequela: Secondary | ICD-10-CM

## 2022-01-17 DIAGNOSIS — O0990 Supervision of high risk pregnancy, unspecified, unspecified trimester: Secondary | ICD-10-CM | POA: Insufficient documentation

## 2022-01-17 DIAGNOSIS — O0991 Supervision of high risk pregnancy, unspecified, first trimester: Secondary | ICD-10-CM

## 2022-01-17 DIAGNOSIS — N883 Incompetence of cervix uteri: Secondary | ICD-10-CM

## 2022-01-17 DIAGNOSIS — Z8759 Personal history of other complications of pregnancy, childbirth and the puerperium: Secondary | ICD-10-CM | POA: Insufficient documentation

## 2022-01-17 DIAGNOSIS — Z3A11 11 weeks gestation of pregnancy: Secondary | ICD-10-CM

## 2022-01-17 DIAGNOSIS — Z8751 Personal history of pre-term labor: Secondary | ICD-10-CM

## 2022-01-17 DIAGNOSIS — O3431 Maternal care for cervical incompetence, first trimester: Secondary | ICD-10-CM

## 2022-01-17 NOTE — Progress Notes (Signed)
PHQ score 6. ?GAD score 6.  ?Pt states she feels she is doing well at home, pt has good support system.  ? ? ?

## 2022-01-17 NOTE — Progress Notes (Signed)
?Subjective:  ?Sally Wiley is a [id number] [redacted]w[redacted]d but uncertain LMP. She is being seen today for her first obstetrical visit.  Her obstetrical history is significant for  cervical incompetence with breech vaginal delivery at 22 weeks that resulted in head entrapment. A Duhrssen's incision was made. She had a cerclage placed last pregnancy and had PPROM at 29 weeks and was admitted until she went into labor at 31 weeks.  No medical complications. Patient does intend to breast feed. Pregnancy history fully reviewed. ? ?Patient reports no complaints. ? ?BP 105/60   Pulse 91   Wt 122 lb (55.3 kg)   LMP 10/29/2021   BMI 21.61 kg/m?  ? ?HISTORY: ?OB History  ?Gravida Para Term Preterm AB Living  ?3 2   2   1   ?SAB IAB Ectopic Multiple Live Births  ?      0 1  ?  ?# Outcome Date GA Lbr Len/2nd Weight Sex Delivery Anes PTL Lv  ?3 Current           ?2 Preterm 04/17/20 [redacted]w[redacted]d 01:56 / 00:14 3 lb 8.8 oz (1.61 kg) M Vag-Spont EPI  LIV  ?1 Preterm 06/22/19 [redacted]w[redacted]d  15.5 oz (0.439 kg) M   Y FD  ?  ?Obstetric Comments  ?Had cervical incompetence with first pregnancy, delivered at 22wks, breech with head entrapment with subsequent Duhrssen's Incision and cervical repair. Occurred at [facility].  ? ? ?Past Medical History:  ?Diagnosis Date  ? Bartholin gland cyst 09/13/2011  ? Bartholin's gland abscess 09/13/2011  ? Supervision of other normal pregnancy, antepartum 11/25/2019  ? BABYSCRIPTS PATIENT: [ ] Initial [x] 12 [x]  15 [ ] 20 [ ] 28 [ ] 32 [ ] 36 [ ] 38 [ ] 39 [ ] 40 Nursing Staff Provider Office Location [facility]  Dating   Language  English  Anatomy US   Flu Vaccine  Declined 11/25/19 Genetic Screen  NIPS: low risk   AFP Neg TDaP vaccine   04-03-20 Hgb A1C or  GTT Early  Third trimester: Normal    Glucose, Fasting 65 - 91 mg/dL 75  Glucose, 1 hour 65 - 179 mg/dL 173  Glucose,   ? ? ?Past Surgical History:  ?Procedure Laterality Date  ? CERVICAL CERCLAGE N/A 12/16/2019  ? Procedure: CERCLAGE CERVICAL;  Surgeon: Aletha Halim, MD;  Location: [facility] LD [facility]S;  Service: Gynecology;  Laterality: N/A;  ? DILATION AND CURETTAGE OF UTERUS    ? ? ?Family History  ?Problem Relation Age of Onset  ? Cancer Paternal Grandmother   ? Heart disease Mother   ? Hypertension Mother   ? Diabetes Neg Hx   ? ? ? ?Exam  ?BP 105/60   Pulse 91   Wt 122 lb (55.3 kg)   LMP 10/29/2021   BMI 21.61 kg/m?  ? ?Chaperone present during exam ? ?CONSTITUTIONAL: Well-developed, well-nourished female in no acute distress.  ?HENT:  Normocephalic, atraumatic, External right and left ear normal. Oropharynx is clear and moist ?EYES: Conjunctivae and EOM are normal. Pupils are equal, round, and reactive to light. No scleral icterus.  ?NECK: Normal range of motion, supple, no masses.  Normal thyroid.  ?CARDIOVASCULAR: Normal heart rate noted, regular rhythm ?RESPIRATORY: Clear to auscultation bilaterally. Effort and breath sounds normal, no problems with respiration noted. ?BREASTS: Symmetric in size. No masses, skin changes, nipple drainage, or lymphadenopathy. ?ABDOMEN: Soft, normal bowel sounds, no distention noted.  No tenderness, rebound or guarding.  ?PELVIC: Normal appearing external genitalia; normal appearing vaginal mucosa. Cervix notable for healed cervical  laceration with remaining mild deformity at the 12 o'clock position. ?MUSCULOSKELETAL: Normal range of motion. No tenderness.  No cyanosis, clubbing, or edema.  2+ distal pulses. ?SKIN: Skin is warm and dry. No rash noted. Not diaphoretic. No erythema. No pallor. ?NEUROLOGIC: Alert and oriented to person, place, and time. Normal reflexes, muscle tone coordination. No cranial nerve deficit noted. ?PSYCHIATRIC: Normal mood and affect. Normal behavior. Normal judgment and thought content. ? ?  ?Assessment:  ? ? Pregnancy: [id number] ?Patient Active Problem List  ? Diagnosis Date Noted  ? Supervision of high risk pregnancy, antepartum 01/17/2022  ? Preterm labor in third trimester with preterm delivery 04/17/2020   ? Preterm premature rupture of membranes (PPROM) with unknown onset of labor 04/05/2020  ? Anemia, antepartum 04/03/2020  ? Cervical cerclage suture present 12/16/2019  ? Short interval between pregnancies affecting pregnancy, antepartum 11/25/2019  ? Cervical laceration 11/25/2019  ? Cervical incompetence 08/12/2019  ? ? ?  ?Plan:  ? ?1. Supervision of high risk pregnancy, antepartum ?On my scan today, appears to be closer to 8 weeks, but baby was against uterine wall and it was a transabdominal US. Will recheck next appt. ? ?2. Cervical incompetence ?Will need cerclage ? ?3. Cervical laceration, sequela ? ?4. History of preterm premature rupture of membranes (PPROM) ?Makena no longer recommended ? ?5. History of preterm labor ? ? ?  ?Initial labs obtained ?Continue prenatal vitamins ?Reviewed n/v relief measures and warning s/s to report ?Reviewed recommended weight gain based on pre-gravid BMI ?Encouraged well-balanced diet ?Genetic & carrier screening discussed: requests Panorama,  ?Ultrasound discussed; fetal survey: requested ?CC[employer] completed> form faxed if has or is planning to apply for medicaid ?The nature of [place] [facility] with multiple MDs and other Advanced Practice Providers was explained to patient; also emphasized that fellows, residents, and students are part of our team. ? ?Problem list reviewed and updated. ?75% of 30 min visit spent on counseling and coordination of care.  ?  ? ?Truett Mainland ?01/17/2022 ? ?

## 2022-01-18 LAB — CBC/D/PLT+RPR+RH+ABO+RUBIGG...
Antibody Screen: NEGATIVE
Basophils Absolute: 0.1 10*3/uL (ref 0.0–0.2)
Basos: 0 %
EOS (ABSOLUTE): 0 10*3/uL (ref 0.0–0.4)
Eos: 0 %
HCV Ab: NONREACTIVE
HIV Screen 4th Generation wRfx: NONREACTIVE
Hematocrit: 38.9 % (ref 34.0–46.6)
Hemoglobin: 13.9 g/dL (ref 11.1–15.9)
Hepatitis B Surface Ag: NEGATIVE
Immature Grans (Abs): 0 10*3/uL (ref 0.0–0.1)
Immature Granulocytes: 0 %
Lymphocytes Absolute: 1.9 10*3/uL (ref 0.7–3.1)
Lymphs: 15 %
MCH: 31.9 pg (ref 26.6–33.0)
MCHC: 35.7 g/dL (ref 31.5–35.7)
MCV: 89 fL (ref 79–97)
Monocytes Absolute: 0.7 10*3/uL (ref 0.1–0.9)
Monocytes: 5 %
Neutrophils Absolute: 9.9 10*3/uL — ABNORMAL HIGH (ref 1.4–7.0)
Neutrophils: 80 %
Platelets: 404 10*3/uL (ref 150–450)
RBC: 4.36 x10E6/uL (ref 3.77–5.28)
RDW: 12.3 % (ref 11.7–15.4)
RPR Ser Ql: NONREACTIVE
Rh Factor: POSITIVE
Rubella Antibodies, IGG: 13.1 index (ref >0.99)
WBC: 12.6 10*3/uL — ABNORMAL HIGH (ref 3.4–10.8)

## 2022-01-18 LAB — HCV INTERPRETATION

## 2022-01-21 ENCOUNTER — Telehealth: Payer: Self-pay

## 2022-01-21 DIAGNOSIS — N390 Urinary tract infection, site not specified: Secondary | ICD-10-CM

## 2022-01-21 LAB — CYTOLOGY - PAP
Comment: NEGATIVE
Diagnosis: NEGATIVE
High risk HPV: NEGATIVE

## 2022-01-21 LAB — URINE CULTURE, OB REFLEX

## 2022-01-21 LAB — CULTURE, OB URINE

## 2022-01-21 MED ORDER — NITROFURANTOIN MONOHYD MACRO 100 MG PO CAPS: 100.0000 mg | ORAL_CAPSULE | Freq: Two times a day (BID) | ORAL | 0 refills | Status: DC

## 2022-01-21 NOTE — Telephone Encounter (Signed)
Called pt to inform her of UTI. Macrobid 100 mg ?Take 1 capsule BID x 7 days was sent to her pharmacy.Understanding was voiced. ?Sally Wiley l Halo Shevlin, CMA  ?

## 2022-02-20 ENCOUNTER — Ambulatory Visit (INDEPENDENT_AMBULATORY_CARE_PROVIDER_SITE_OTHER): Payer: 59 | Admitting: Family Medicine

## 2022-02-20 VITALS — BP 122/71 | HR 94 | Wt 124.0 lb

## 2022-02-20 DIAGNOSIS — Z8751 Personal history of pre-term labor: Secondary | ICD-10-CM

## 2022-02-20 DIAGNOSIS — Z8759 Personal history of other complications of pregnancy, childbirth and the puerperium: Secondary | ICD-10-CM

## 2022-02-20 DIAGNOSIS — O0990 Supervision of high risk pregnancy, unspecified, unspecified trimester: Secondary | ICD-10-CM

## 2022-02-20 DIAGNOSIS — N883 Incompetence of cervix uteri: Secondary | ICD-10-CM

## 2022-02-20 DIAGNOSIS — Z3A12 12 weeks gestation of pregnancy: Secondary | ICD-10-CM

## 2022-02-20 NOTE — H&P (View-Only) (Signed)
? ?  PRENATAL VISIT NOTE ? ?Subjective:  ?Sally Wiley is a 36 y.o. G3P0201 at [redacted]w[redacted]d being seen today for ongoing prenatal care.  She is currently monitored for the following issues for this high-risk pregnancy and has Cervical incompetence; Cervical laceration; History of preterm labor; Supervision of high risk pregnancy, antepartum; and History of preterm premature rupture of membranes (PPROM) on their problem list. ? ?Patient reports no complaints.  Contractions: Not present. Vag. Bleeding: None.  Movement: Absent. Denies leaking of fluid.  ? ?The following portions of the patient's history were reviewed and updated as appropriate: allergies, current medications, past family history, past medical history, past social history, past surgical history and problem list.  ? ?Objective:  ? ?Vitals:  ? 02/20/22 0930  ?BP: 122/71  ?Pulse: 94  ?Weight: 124 lb (56.2 kg)  ? ? ?Fetal Status: Fetal Heart Rate (bpm): 157   Movement: Absent    ? ?General:  Alert, oriented and cooperative. Patient is in no acute distress.  ?Skin: Skin is warm and dry. No rash noted.   ?Cardiovascular: Normal heart rate noted  ?Respiratory: Normal respiratory effort, no problems with respiration noted  ?Abdomen: Soft, gravid, appropriate for gestational age.  Pain/Pressure: Absent     ?Pelvic: Cervical exam deferred        ?Extremities: Normal range of motion.  Edema: None  ?Mental Status: Normal mood and affect. Normal behavior. Normal judgment and thought content.  ? ?Assessment and Plan:  ?Pregnancy: [id number] at [redacted]w[redacted]d ?1. [redacted] weeks gestation of pregnancy ?- Genetic Screening ? ?2. Supervision of high risk pregnancy, antepartum ?FHT and FH normal ? ?3. History of preterm premature rupture of membranes (PPROM) ? ?4. Cervical incompetence ?Will get scheduled for cerclage ? ?5. History of preterm labor ? ? ?Preterm labor symptoms and general obstetric precautions including but not limited to vaginal bleeding, contractions, leaking of fluid and  fetal movement were reviewed in detail with the patient. ?Please refer to After Visit Summary for other counseling recommendations.  ? ?No follow-ups on file. ? ?Future Appointments  ?Date Time Provider [place]  ?03/11/2022  2:15 PM [facility] NURSE [facility]  ?03/11/2022  2:45 PM [facility] US4 [facility]  ?03/20/2022 10:35 AM Nehemiah Settle Tanna Savoy, DO [facility] None  ?04/17/2022 10:35 AM Nehemiah Settle Tanna Savoy, DO [facility] None  ? ? ?Truett Mainland, DO ?

## 2022-02-20 NOTE — Progress Notes (Signed)
? ?  PRENATAL VISIT NOTE ? ?Subjective:  ?Sally Wiley is a 36 y.o. G3P0201 at [redacted]w[redacted]d being seen today for ongoing prenatal care.  She is currently monitored for the following issues for this high-risk pregnancy and has Cervical incompetence; Cervical laceration; History of preterm labor; Supervision of high risk pregnancy, antepartum; and History of preterm premature rupture of membranes (PPROM) on their problem list. ? ?Patient reports no complaints.  Contractions: Not present. Vag. Bleeding: None.  Movement: Absent. Denies leaking of fluid.  ? ?The following portions of the patient's history were reviewed and updated as appropriate: allergies, current medications, past family history, past medical history, past social history, past surgical history and problem list.  ? ?Objective:  ? ?Vitals:  ? 02/20/22 0930  ?BP: 122/71  ?Pulse: 94  ?Weight: 124 lb (56.2 kg)  ? ? ?Fetal Status: Fetal Heart Rate (bpm): 157   Movement: Absent    ? ?General:  Alert, oriented and cooperative. Patient is in no acute distress.  ?Skin: Skin is warm and dry. No rash noted.   ?Cardiovascular: Normal heart rate noted  ?Respiratory: Normal respiratory effort, no problems with respiration noted  ?Abdomen: Soft, gravid, appropriate for gestational age.  Pain/Pressure: Absent     ?Pelvic: Cervical exam deferred        ?Extremities: Normal range of motion.  Edema: None  ?Mental Status: Normal mood and affect. Normal behavior. Normal judgment and thought content.  ? ?Assessment and Plan:  ?Pregnancy: G3P0201 at [redacted]w[redacted]d ?1. [redacted] weeks gestation of pregnancy ?- Genetic Screening ? ?2. Supervision of high risk pregnancy, antepartum ?FHT and FH normal ? ?3. History of preterm premature rupture of membranes (PPROM) ? ?4. Cervical incompetence ?Will get scheduled for cerclage ? ?5. History of preterm labor ? ? ?Preterm labor symptoms and general obstetric precautions including but not limited to vaginal bleeding, contractions, leaking of fluid and  fetal movement were reviewed in detail with the patient. ?Please refer to After Visit Summary for other counseling recommendations.  ? ?No follow-ups on file. ? ?Future Appointments  ?Date Time Provider Department Center  ?03/11/2022  2:15 PM [facility] NURSE [facility]  ?03/11/2022  2:45 PM [facility] US4 [facility]  ?03/20/2022 10:35 AM Tekesha Almgren J, DO [facility] None  ?04/17/2022 10:35 AM Marcheta Horsey J, DO [facility] None  ? ? ?Konnor Vondrasek J Keonte Daubenspeck, DO ?

## 2022-02-26 ENCOUNTER — Telehealth (HOSPITAL_COMMUNITY): Payer: Self-pay | Admitting: *Deleted

## 2022-02-26 ENCOUNTER — Encounter (HOSPITAL_COMMUNITY): Payer: Self-pay

## 2022-02-26 ENCOUNTER — Encounter (HOSPITAL_COMMUNITY): Payer: Self-pay | Admitting: *Deleted

## 2022-02-26 NOTE — Telephone Encounter (Signed)
Preadmission screen  

## 2022-03-07 ENCOUNTER — Inpatient Hospital Stay (HOSPITAL_BASED_OUTPATIENT_CLINIC_OR_DEPARTMENT_OTHER): Payer: 59 | Admitting: Anesthesiology

## 2022-03-07 ENCOUNTER — Encounter (HOSPITAL_COMMUNITY)
Admission: RE | Disposition: A | Discharge status: Home / Self Care | Payer: Self-pay | Source: Ambulatory Visit | Attending: Obstetrics & Gynecology

## 2022-03-07 ENCOUNTER — Other Ambulatory Visit (HOSPITAL_COMMUNITY): Payer: 59

## 2022-03-07 ENCOUNTER — Other Ambulatory Visit: Payer: Self-pay | Admitting: Obstetrics & Gynecology

## 2022-03-07 ENCOUNTER — Ambulatory Visit (HOSPITAL_COMMUNITY)
Admission: RE | Admit: 2022-03-07 | Discharge: 2022-03-07 | Disposition: A | Discharge status: Home / Self Care | Payer: 59 | Source: Ambulatory Visit | Attending: Obstetrics & Gynecology | Admitting: Obstetrics & Gynecology

## 2022-03-07 ENCOUNTER — Inpatient Hospital Stay (HOSPITAL_COMMUNITY): Payer: 59 | Admitting: Anesthesiology

## 2022-03-07 ENCOUNTER — Encounter (HOSPITAL_COMMUNITY): Payer: Self-pay | Admitting: Obstetrics & Gynecology

## 2022-03-07 DIAGNOSIS — O3432 Maternal care for cervical incompetence, second trimester: Secondary | ICD-10-CM | POA: Diagnosis not present

## 2022-03-07 DIAGNOSIS — Z3A14 14 weeks gestation of pregnancy: Secondary | ICD-10-CM | POA: Diagnosis not present

## 2022-03-07 DIAGNOSIS — N883 Incompetence of cervix uteri: Secondary | ICD-10-CM

## 2022-03-07 DIAGNOSIS — O0990 Supervision of high risk pregnancy, unspecified, unspecified trimester: Secondary | ICD-10-CM

## 2022-03-07 HISTORY — PX: CERVICAL CERCLAGE: SHX1329

## 2022-03-07 LAB — CBC
HCT: 36.4 % (ref 36.0–46.0)
Hemoglobin: 12.6 g/dL (ref 12.0–15.0)
MCH: 32.1 pg (ref 26.0–34.0)
MCHC: 34.6 g/dL (ref 30.0–36.0)
MCV: 92.9 fL (ref 80.0–100.0)
Platelets: 313 10*3/uL (ref 150–400)
RBC: 3.92 MIL/uL (ref 3.87–5.11)
RDW: 13.4 % (ref 11.5–15.5)
WBC: 12.9 10*3/uL — ABNORMAL HIGH (ref 4.0–10.5)
nRBC: 0 % (ref 0.0–0.2)

## 2022-03-07 LAB — TYPE AND SCREEN
ABO/RH(D): AB POS
Antibody Screen: NEGATIVE

## 2022-03-07 SURGERY — CERCLAGE, CERVIX, VAGINAL APPROACH
Anesthesia: Spinal | Wound class: Clean Contaminated

## 2022-03-07 MED ORDER — ONDANSETRON HCL 4 MG/2ML IJ SOLN: 4.0000 mg | Freq: Once | INTRAMUSCULAR | Status: DC | PRN

## 2022-03-07 MED ORDER — FAMOTIDINE 20 MG PO TABS
ORAL_TABLET | ORAL | Status: AC
  Filled 2022-03-07: qty 1

## 2022-03-07 MED ORDER — ORAL CARE MOUTH RINSE: 15.0000 mL | Freq: Once | OROMUCOSAL | Status: AC

## 2022-03-07 MED ORDER — TRAMADOL HCL 50 MG PO TABS: 50.0000 mg | ORAL_TABLET | Freq: Four times a day (QID) | ORAL | 0 refills | Status: DC | PRN

## 2022-03-07 MED ORDER — SOD CITRATE-CITRIC ACID 500-334 MG/5ML PO SOLN
ORAL | Status: AC
  Filled 2022-03-07: qty 30

## 2022-03-07 MED ORDER — CHLOROPROCAINE HCL 50 MG/5ML IT SOLN
INTRATHECAL | Status: AC
  Filled 2022-03-07: qty 5

## 2022-03-07 MED ORDER — NALBUPHINE HCL 10 MG/ML IJ SOLN
INTRAMUSCULAR | Status: AC
  Filled 2022-03-07: qty 1

## 2022-03-07 MED ORDER — LACTATED RINGERS IV SOLN: INTRAVENOUS | Status: DC

## 2022-03-07 MED ORDER — FENTANYL CITRATE (PF) 100 MCG/2ML IJ SOLN
INTRAMUSCULAR | Status: DC | PRN
  Administered 2022-03-07: 25 ug via INTRATHECAL

## 2022-03-07 MED ORDER — SOD CITRATE-CITRIC ACID 500-334 MG/5ML PO SOLN
30.0000 mL | Freq: Once | ORAL | Status: AC
  Administered 2022-03-07: 30 mL via ORAL

## 2022-03-07 MED ORDER — CHLOROPROCAINE HCL 1 % IJ SOLN
INTRAMUSCULAR | Status: DC | PRN
  Administered 2022-03-07: 4 mL

## 2022-03-07 MED ORDER — FAMOTIDINE 20 MG PO TABS
20.0000 mg | ORAL_TABLET | Freq: Once | ORAL | Status: AC
  Administered 2022-03-07: 20 mg via ORAL

## 2022-03-07 MED ORDER — STERILE WATER FOR IRRIGATION IR SOLN
Status: DC | PRN
  Administered 2022-03-07: 1000 mL

## 2022-03-07 MED ORDER — NALBUPHINE HCL 10 MG/ML IJ SOLN
10.0000 mg | Freq: Once | INTRAMUSCULAR | Status: AC
  Administered 2022-03-07: 10 mg via INTRAVENOUS

## 2022-03-07 MED ORDER — CHLORHEXIDINE GLUCONATE 0.12 % MT SOLN
15.0000 mL | Freq: Once | OROMUCOSAL | Status: AC
  Administered 2022-03-07: 15 mL via OROMUCOSAL

## 2022-03-07 MED ORDER — CHLORHEXIDINE GLUCONATE 0.12 % MT SOLN
OROMUCOSAL | Status: AC
  Filled 2022-03-07: qty 15

## 2022-03-07 MED ORDER — FENTANYL CITRATE (PF) 100 MCG/2ML IJ SOLN: 25.0000 ug | INTRAMUSCULAR | Status: DC | PRN

## 2022-03-07 MED ORDER — FENTANYL CITRATE (PF) 100 MCG/2ML IJ SOLN
INTRAMUSCULAR | Status: AC
  Filled 2022-03-07: qty 2

## 2022-03-07 SURGICAL SUPPLY — 15 items
CANISTER SUCT 3000ML PPV (MISCELLANEOUS) IMPLANT
GAUZE 4X4 16PLY ~~LOC~~+RFID DBL (SPONGE) ×1 IMPLANT
GLOVE BIO SURGEON STRL SZ 6.5 (GLOVE) ×2 IMPLANT
GLOVE BIOGEL PI IND STRL 7.0 (GLOVE) ×2 IMPLANT
GLOVE BIOGEL PI INDICATOR 7.0 (GLOVE) ×2
GOWN STRL REUS W/TWL LRG LVL3 (GOWN DISPOSABLE) ×4 IMPLANT
NS IRRIG 1000ML POUR BTL (IV SOLUTION) ×2 IMPLANT
PACK VAGINAL MINOR WOMEN LF (CUSTOM PROCEDURE TRAY) ×2 IMPLANT
PAD OB MATERNITY 4.3X12.25 (PERSONAL CARE ITEMS) ×2 IMPLANT
PAD PREP 24X48 CUFFED NSTRL (MISCELLANEOUS) ×2 IMPLANT
SUT PROLENE 1 CT 1 30 (SUTURE) ×2 IMPLANT
TOWEL OR 17X24 6PK STRL BLUE (TOWEL DISPOSABLE) ×4 IMPLANT
TRAY FOLEY W/BAG SLVR 14FR (SET/KITS/TRAYS/PACK) ×2 IMPLANT
TUBING NON-CON 1/4 X 20 CONN (TUBING) IMPLANT
YANKAUER SUCT BULB TIP NO VENT (SUCTIONS) IMPLANT

## 2022-03-07 NOTE — Anesthesia Postprocedure Evaluation (Signed)
Anesthesia Post Note ? ?Patient: Sally Wiley ? ?Procedure(s) Performed: CERCLAGE CERVICAL ? ?  ? ?Patient location during evaluation: PACU ?Anesthesia Type: Spinal ?Level of consciousness: awake, awake and alert and oriented ?Pain management: pain level controlled ?Vital Signs Assessment: post-procedure vital signs reviewed and stable ?Respiratory status: spontaneous breathing, nonlabored ventilation and respiratory function stable ?Cardiovascular status: blood pressure returned to baseline and stable ?Postop Assessment: no headache, no backache, spinal receding and no apparent nausea or vomiting ?Anesthetic complications: no ? ? ?No notable events documented. ? ?Last Vitals:  ?Vitals:  ? 03/07/22 1245 03/07/22 1300  ?BP: 112/66 104/62  ?Pulse: 93 88  ?Resp: 14 13  ?Temp:    ?SpO2: 100% 100%  ?  ?Last Pain:  ?Vitals:  ? 03/07/22 1300  ?TempSrc:   ?PainSc: 0-No pain  ? ?Pain Goal:   ? ?LLE Motor Response: Purposeful movement (03/07/22 1300) ?LLE Sensation: Numbness, Tingling (03/07/22 1300) ?RLE Motor Response: Purposeful movement (03/07/22 1300) ?RLE Sensation: Numbness, Tingling (03/07/22 1300) ?  ?  ?Epidural/Spinal Function Cutaneous sensation: Tingles (03/07/22 1300), Patient able to flex knees: Yes (03/07/22 1300), Patient able to lift hips off bed: No (03/07/22 1300), Back pain beyond tenderness at insertion site: No (03/07/22 1300), Progressively worsening motor and/or sensory loss: No (03/07/22 1300), Bowel and/or bladder incontinence post epidural: No (03/07/22 1300) ? ?Santa Lighter ? ? ? ? ?

## 2022-03-07 NOTE — Progress Notes (Signed)
Discharge instructions reviewed with pt.  Pt discharged home.   ?

## 2022-03-07 NOTE — Interval H&P Note (Signed)
History and Physical Interval Note: ? ?03/07/2022 ?11:16 AM ? ?Sally Wiley  has presented today for surgery, with the diagnosis of INCOMPETENT CERVIX.  The various methods of treatment have been discussed with the patient and family. After consideration of risks, benefits and other options for treatment, the patient has consented to  Procedure(s): ?CERCLAGE CERVICAL (N/A) as a surgical intervention.  The patient's history has been reviewed, patient examined, no change in status, stable for surgery.  I have reviewed the patient's chart and labs.  Questions were answered to the patient's satisfaction.   ?Patient desires surgical management .  The risks of surgery were discussed in detail with the patient including but not limited to: bleeding which may require transfusion or reoperation; infection which may require prolonged hospitalization or re-hospitalization and antibiotic therapy; injury to bowel, bladder, ureters and major vessels or other surrounding organs which may lead to other procedures; formation of adhesions; need for additional procedures including laparotomy or subsequent procedures secondary to intraoperative injury or abnormal pathology; thromboembolic phenomenon; incisional problems and other postoperative or anesthesia complications.  Patient was told that the likelihood that her condition and symptoms will be treated effectively with this surgical management was very high; the postoperative expectations were also discussed in detail. The patient also understands the alternative treatment options which were discussed in full. All questions were answered.    ? ?Emeterio Reeve ? ? ?

## 2022-03-07 NOTE — Anesthesia Preprocedure Evaluation (Addendum)
Anesthesia Evaluation  ?Patient identified by MRN, date of birth, ID band ?Patient awake ? ? ? ?Reviewed: ?Allergy & Precautions, NPO status , Patient's Chart, lab work & pertinent test results ? ?History of Anesthesia Complications ?Negative for: history of anesthetic complications ? ?Airway ?Mallampati: III ? ?TM Distance: >3 FB ?Neck ROM: Full ? ? ? Dental ? ?(+) Teeth Intact, Dental Advisory Given, Chipped,  ?  ?Pulmonary ?neg pulmonary ROS,  ?  ?Pulmonary exam normal ?breath sounds clear to auscultation ? ? ? ? ? ? Cardiovascular ?Exercise Tolerance: Good ?negative cardio ROS ?Normal cardiovascular exam ?Rhythm:Regular Rate:Normal ? ? ?  ?Neuro/Psych ?negative neurological ROS ? negative psych ROS  ? GI/Hepatic ?negative GI ROS, Neg liver ROS,   ?Endo/Other  ?negative endocrine ROS ? Renal/GU ?negative Renal ROS  ? ?  ?Musculoskeletal ?negative musculoskeletal ROS ?(+)  ? Abdominal ?  ?Peds ? Hematology ?negative hematology ROS ?(+)   ?Anesthesia Other Findings ?Day of surgery medications reviewed with the patient. ? Reproductive/Obstetrics ?(+) Pregnancy ?INCOMPETENT CERVIX ? ?  ? ? ? ? ? ? ? ? ? ? ? ? ? ?  ?  ? ? ? ? ? ? ? ?Anesthesia Physical ?Anesthesia Plan ? ?ASA: 2 ? ?Anesthesia Plan: Spinal  ? ?Post-op Pain Management:   ? ?Induction:  ? ?PONV Risk Score and Plan: 2 and Treatment may vary due to age or medical condition ? ?Airway Management Planned: Natural Airway ? ?Additional Equipment:  ? ?Intra-op Plan:  ? ?Post-operative Plan:  ? ?Informed Consent: I have reviewed the patients History and Physical, chart, labs and discussed the procedure including the risks, benefits and alternatives for the proposed anesthesia with the patient or authorized representative who has indicated his/her understanding and acceptance.  ? ? ? ?Dental advisory given ? ?Plan Discussed with: CRNA, Anesthesiologist and Surgeon ? ?Anesthesia Plan Comments: (Discussed risks and benefits of and  differences between spinal and general. Discussed risks of spinal including headache, backache, failure, bleeding, infection, and nerve damage. Patient consents to spinal. Questions answered. Coagulation studies and platelet count acceptable.)  ? ? ? ? ? ? ?Anesthesia Quick Evaluation ? ?

## 2022-03-07 NOTE — Op Note (Signed)
Surgeon: Scheryl Darter  ? ?Assistants: none. ?Anesthesia: Spinal ? ?ASA Class: 1 ?Procedure: Cervical cerclage with 1 Prolene ?Preop diagnosis: Incompetent cervix at [redacted]w[redacted]d weeks gestation ?Postop diagnosis: Same ?Estimated blood loss: Less than 5 mL ?Complications: None ?Drains: None ?Counts: Correct ? ?The patient gave written consent for cervical cerclage due to incompetent cervix at 14.[redacted] weeks gestation. Patient identification was confirmed and she was brought to the OR and spinal anesthesia was induced. She was placed in dorsal lithotomy position. Her perineum and vagina versus sterilely prepped and draped. The bladder was drained with a Foley catheter. Exam revealed a long closed cervix. A Graves speculum was placed. 1 Prolene was used to suture at 4 quadrants 2-3 cm posterior to the os and tied at 1200. Good placement was seen with a suture about 3 cm back from the end of the cervix. All instruments were removed. There was minimal bleeding.  Patient tolerated the procedure well without complications. She was brought in stable condition to the PACU. ? ?Scheryl Darter ?03/07/2022 ?12:08 PM ? ?  ?

## 2022-03-07 NOTE — Anesthesia Procedure Notes (Signed)
Spinal ? ?Patient location during procedure: OR ?Start time: 03/07/2022 11:29 AM ?End time: 03/07/2022 11:31 AM ?Reason for block: surgical anesthesia ?Staffing ?Performed: anesthesiologist  ?Anesthesiologist: Collene Schlichter, MD ?Preanesthetic Checklist ?Completed: patient identified, IV checked, risks and benefits discussed, surgical consent, monitors and equipment checked, pre-op evaluation and timeout performed ?Spinal Block ?Patient position: sitting ?Prep: DuraPrep and site prepped and draped ?Patient monitoring: continuous pulse ox and blood pressure ?Approach: midline ?Location: L3-4 ?Injection technique: single-shot ?Needle ?Needle type: Pencan  ?Needle gauge: 24 G ?Assessment ?Sensory level: T8 ?Events: CSF return ?Additional Notes ?Functioning IV was confirmed and monitors were applied. Sterile prep and drape, including hand hygiene, mask and sterile gloves were used. The patient was positioned and the spine was prepped. The skin was anesthetized with lidocaine.  Free flow of clear CSF was obtained prior to injecting local anesthetic into the CSF.  The spinal needle aspirated freely following injection.  The needle was carefully withdrawn.  The patient tolerated the procedure well. Consent was obtained prior to procedure with all questions answered and concerns addressed. Risks including but not limited to bleeding, infection, nerve damage, paralysis, failed block, inadequate analgesia, allergic reaction, high spinal, itching and headache were discussed and the patient wished to proceed.  ? ?Arrie Aran, MD ? ? ? ?

## 2022-03-07 NOTE — Transfer of Care (Signed)
Immediate Anesthesia Transfer of Care Note ? ?Patient: Sally Wiley ? ?Procedure(s) Performed: CERCLAGE CERVICAL ? ?Patient Location: PACU ? ?Anesthesia Type:Spinal ? ?Level of Consciousness: awake ? ?Airway & Oxygen Therapy: Patient Spontanous Breathing ? ?Post-op Assessment: Report given to RN ? ?Post vital signs: Reviewed and stable ? ?Last Vitals:  ?Vitals Value Taken Time  ?BP 101/61 03/07/22 1215  ?Temp    ?Pulse 94 03/07/22 1216  ?Resp 19 03/07/22 1216  ?SpO2 100 % 03/07/22 1216  ?Vitals shown include unvalidated device data. ? ?Last Pain:  ?Vitals:  ? 03/07/22 1015  ?TempSrc: Oral  ?PainSc: 0-No pain  ?   ? ?  ? ?Complications: No notable events documented. ?

## 2022-03-11 ENCOUNTER — Other Ambulatory Visit: Payer: 59

## 2022-03-11 ENCOUNTER — Ambulatory Visit: Payer: 59

## 2022-03-20 ENCOUNTER — Ambulatory Visit (INDEPENDENT_AMBULATORY_CARE_PROVIDER_SITE_OTHER): Payer: 59 | Admitting: Family Medicine

## 2022-03-20 VITALS — BP 111/61 | HR 86 | Wt 129.0 lb

## 2022-03-20 DIAGNOSIS — N883 Incompetence of cervix uteri: Secondary | ICD-10-CM

## 2022-03-20 DIAGNOSIS — O0990 Supervision of high risk pregnancy, unspecified, unspecified trimester: Secondary | ICD-10-CM

## 2022-03-20 DIAGNOSIS — Z3A16 16 weeks gestation of pregnancy: Secondary | ICD-10-CM

## 2022-03-20 DIAGNOSIS — Z8759 Personal history of other complications of pregnancy, childbirth and the puerperium: Secondary | ICD-10-CM

## 2022-03-20 NOTE — Progress Notes (Signed)
   PRENATAL VISIT NOTE  Subjective:  Sally Wiley is a 36 y.o. G3P0201 at [redacted]w[redacted]d being seen today for ongoing prenatal care.  She is currently monitored for the following issues for this high-risk pregnancy and has Cervical incompetence; History of preterm labor; Supervision of high risk pregnancy, antepartum; and History of preterm premature rupture of membranes (PPROM) on their problem list.  Patient reports no complaints.  Contractions: Not present. Vag. Bleeding: None.  Movement: Absent. Denies leaking of fluid.   The following portions of the patient's history were reviewed and updated as appropriate: allergies, current medications, past family history, past medical history, past social history, past surgical history and problem list.   Objective:   Vitals:   03/20/22 1038  BP: 111/61  Pulse: 86  Weight: 129 lb (58.5 kg)    Fetal Status: Fetal Heart Rate (bpm): 151   Movement: Absent     General:  Alert, oriented and cooperative. Patient is in no acute distress.  Skin: Skin is warm and dry. No rash noted.   Cardiovascular: Normal heart rate noted  Respiratory: Normal respiratory effort, no problems with respiration noted  Abdomen: Soft, gravid, appropriate for gestational age.  Pain/Pressure: Present     Pelvic: Cervical exam deferred        Extremities: Normal range of motion.  Edema: None  Mental Status: Normal mood and affect. Normal behavior. Normal judgment and thought content.   Assessment and Plan:  Pregnancy: G3P0201 at [redacted]w[redacted]d 1. [redacted] weeks gestation of pregnancy  2. Supervision of high risk pregnancy, antepartum FHT and FH normal  3. History of preterm premature rupture of membranes (PPROM)  4. Cervical incompetence Cerclage in place. No problems   Preterm labor symptoms and general obstetric precautions including but not limited to vaginal bleeding, contractions, leaking of fluid and fetal movement were reviewed in detail with the patient. Please refer to  After Visit Summary for other counseling recommendations.   No follow-ups on file.  Future Appointments  Date Time Provider [place]  04/08/2022  2:30 PM [facility] NURSE [facility]  04/08/2022  2:45 PM [facility] US4 [facility]  04/17/2022 10:35 AM Truett Mainland, DO [facility] None  05/15/2022 10:35 AM Anyanwu, Sallyanne Havers, MD [facility] None  05/30/2022  8:35 AM Truett Mainland, DO [facility] None  06/14/2022 10:15 AM Truett Mainland, DO [facility] None  06/27/2022 10:15 AM Truett Mainland, DO [facility] None    Truett Mainland, DO

## 2022-04-08 ENCOUNTER — Ambulatory Visit: Payer: 59

## 2022-04-17 ENCOUNTER — Ambulatory Visit (INDEPENDENT_AMBULATORY_CARE_PROVIDER_SITE_OTHER): Payer: 59 | Admitting: Family Medicine

## 2022-04-17 VITALS — BP 111/67 | HR 82 | Wt 134.0 lb

## 2022-04-17 DIAGNOSIS — O0990 Supervision of high risk pregnancy, unspecified, unspecified trimester: Secondary | ICD-10-CM

## 2022-04-17 DIAGNOSIS — Z3A20 20 weeks gestation of pregnancy: Secondary | ICD-10-CM | POA: Diagnosis not present

## 2022-04-17 DIAGNOSIS — N764 Abscess of vulva: Secondary | ICD-10-CM

## 2022-04-17 DIAGNOSIS — N883 Incompetence of cervix uteri: Secondary | ICD-10-CM

## 2022-04-17 DIAGNOSIS — Z3492 Encounter for supervision of normal pregnancy, unspecified, second trimester: Secondary | ICD-10-CM | POA: Diagnosis not present

## 2022-04-17 DIAGNOSIS — Z8759 Personal history of other complications of pregnancy, childbirth and the puerperium: Secondary | ICD-10-CM

## 2022-04-17 NOTE — Progress Notes (Signed)
   PRENATAL VISIT NOTE  Subjective:  Sally Wiley is a 36 y.o. G3P0201 at [redacted]w[redacted]d being seen today for ongoing prenatal care.  She is currently monitored for the following issues for this high-risk pregnancy and has Cervical incompetence; History of preterm labor; Supervision of high risk pregnancy, antepartum; and History of preterm premature rupture of membranes (PPROM) on their problem list.  Patient reports  a vulvar abscess .  Contractions: Not present. Vag. Bleeding: None.  Movement: Present. Denies leaking of fluid.   The following portions of the patient's history were reviewed and updated as appropriate: allergies, current medications, past family history, past medical history, past social history, past surgical history and problem list.   Objective:   Vitals:   04/17/22 1011  BP: 111/67  Pulse: 82  Weight: 134 lb (60.8 kg)    Fetal Status: Fetal Heart Rate (bpm): 156   Movement: Present     General:  Alert, oriented and cooperative. Patient is in no acute distress.  Skin: Skin is warm and dry. No rash noted.   Cardiovascular: Normal heart rate noted  Respiratory: Normal respiratory effort, no problems with respiration noted  Abdomen: Soft, gravid, appropriate for gestational age.  Pain/Pressure: Present     Pelvic: Pelvic exam done with chaperone present .       There is a small scar from the right side.  No drainage.  Extremities: Normal range of motion.  Edema: None  Mental Status: Normal mood and affect. Normal behavior. Normal judgment and thought content.   Assessment and Plan:  Pregnancy: G3P0201 at [redacted]w[redacted]d 1. [redacted] weeks gestation of pregnancy - AFP, Serum, Open Spina Bifida  2. Supervision of high risk pregnancy, antepartum FHT and FH normal Sound 7/10 AFP today  3. History of preterm premature rupture of membranes (PPROM)  4. Cervical incompetence Cerclage in place.  Patient's not having any complications cerclage.  5. Vulvar abscess Healing well.   Discussed that shaving and waxing will increase risk of redevelopment.  We also discussed if she begins to develop another abscess, she can always call for same-day appointment to address.  Preterm labor symptoms and general obstetric precautions including but not limited to vaginal bleeding, contractions, leaking of fluid and fetal movement were reviewed in detail with the patient. Please refer to After Visit Summary for other counseling recommendations.   No follow-ups on file.  Future Appointments  Date Time Provider Department Center  05/06/2022  1:15 PM [facility] NURSE [facility]  05/06/2022  1:30 PM [facility] US3 [facility]  05/15/2022 10:35 AM Anyanwu, Jethro Bastos, MD [facility] None  05/30/2022  8:35 AM Levie Heritage, DO [facility] None  06/14/2022 10:15 AM Levie Heritage, DO [facility] None  06/27/2022 10:15 AM Adrian Blackwater Rhona Raider, DO [facility] None    Levie Heritage, DO

## 2022-04-19 LAB — AFP, SERUM, OPEN SPINA BIFIDA
AFP MoM: 0.65
AFP Value: 47.4 ng/mL
Gest. Age on Collection Date: 20.4 weeks
Maternal Age At EDD: 33.9 yr
OSBR Risk 1 IN: 10000
Test Results:: NEGATIVE
Weight: 134 [lb_av]

## 2022-04-22 ENCOUNTER — Encounter: Payer: Self-pay | Admitting: Family Medicine

## 2022-05-06 ENCOUNTER — Other Ambulatory Visit: Payer: Self-pay | Admitting: Family Medicine

## 2022-05-06 ENCOUNTER — Ambulatory Visit: Payer: 59 | Attending: Obstetrics and Gynecology | Admitting: *Deleted

## 2022-05-06 ENCOUNTER — Ambulatory Visit (HOSPITAL_BASED_OUTPATIENT_CLINIC_OR_DEPARTMENT_OTHER): Payer: 59

## 2022-05-06 ENCOUNTER — Encounter: Payer: Self-pay | Admitting: Family Medicine

## 2022-05-06 ENCOUNTER — Ambulatory Visit: Payer: 59 | Attending: Obstetrics and Gynecology | Admitting: Obstetrics and Gynecology

## 2022-05-06 ENCOUNTER — Encounter: Payer: Self-pay | Admitting: *Deleted

## 2022-05-06 ENCOUNTER — Other Ambulatory Visit: Payer: Self-pay | Admitting: *Deleted

## 2022-05-06 VITALS — BP 116/65 | HR 89

## 2022-05-06 DIAGNOSIS — O3412 Maternal care for benign tumor of corpus uteri, second trimester: Secondary | ICD-10-CM | POA: Diagnosis not present

## 2022-05-06 DIAGNOSIS — O4402 Placenta previa specified as without hemorrhage, second trimester: Secondary | ICD-10-CM | POA: Insufficient documentation

## 2022-05-06 DIAGNOSIS — N883 Incompetence of cervix uteri: Secondary | ICD-10-CM

## 2022-05-06 DIAGNOSIS — O0990 Supervision of high risk pregnancy, unspecified, unspecified trimester: Secondary | ICD-10-CM

## 2022-05-06 DIAGNOSIS — O4400 Placenta previa specified as without hemorrhage, unspecified trimester: Secondary | ICD-10-CM | POA: Insufficient documentation

## 2022-05-06 DIAGNOSIS — Z8759 Personal history of other complications of pregnancy, childbirth and the puerperium: Secondary | ICD-10-CM | POA: Diagnosis not present

## 2022-05-06 DIAGNOSIS — Z3A23 23 weeks gestation of pregnancy: Secondary | ICD-10-CM | POA: Diagnosis not present

## 2022-05-06 DIAGNOSIS — Z363 Encounter for antenatal screening for malformations: Secondary | ICD-10-CM | POA: Diagnosis not present

## 2022-05-06 DIAGNOSIS — O3432 Maternal care for cervical incompetence, second trimester: Secondary | ICD-10-CM | POA: Diagnosis not present

## 2022-05-06 DIAGNOSIS — O09292 Supervision of pregnancy with other poor reproductive or obstetric history, second trimester: Secondary | ICD-10-CM | POA: Diagnosis not present

## 2022-05-06 NOTE — Progress Notes (Signed)
Maternal-Fetal Medicine   Name: Sally Wiley DOB: 06/14/1986 MRN: [id number] Referring Provider: Candelaria Celeste, MD  I had the pleasure of seeing [facility] at [facility]. She is G3 P0201 at 23w 2d gestation and is here for fetal anatomy scan.  On cell-free fetal DNA screening, the risks of fetal aneuploidies are not increased.  MSAFP screening showed low risk for open neural tube defects. Patient had prophylactic cerclage in this pregnancy at [redacted] weeks gestation.  Obstetrical history significant for a 22-week pregnancy loss in 2020 of a female infant weighing 15 ounces at birth.  Unfortunately, the infant did not survive after birth.  Baby was delivered by breech with a head entrapment that required Duherssen's incision and cervical repair. In 2021, patient had a spontaneous vaginal delivery at [redacted] weeks gestation of a female infant weighing 3 pounds and 8 ounces at birth.  Her son is in good health.  She had prophylactic cerclage in that pregnancy.  Patient reports no chronic medical conditions including hypertension or diabetes.  Ultrasound We performed a fetal anatomical survey.  Amniotic fluid is normal and good fetal activity seen.  Fetal biometry is consistent with the previously established dates.  No markers of aneuploidies or fetal structural defects are seen. We performed a transvaginal ultrasound to evaluate the cervix, cerclage and placental position.  The cervix measures 3.1 cm and the cerclage is seen close to the external os. 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  Our concerns include History of preterm deliveries Patient is aware that progesterone injection [facility]) has been withdrawn. I reassured the patient of normal cervical length measurement.  Patient understands that cerclage does not guarantee carrying pregnancy to term.  History of preterm deliveries the most important cause of recurrent  preterm delivery.  Given that she has normal cervical length measurement, vaginal progesterone is not indicated.  Placenta previa I explained the diagnosis with the help of ultrasound images. I informed her that placenta previa is associated with recurrent vaginal bleeding that would require inpatient management. If recurrent bleeding occurs, patient may have to hospitalized until delivery. Placenta previa is associated with increased risk of preterm delivery and fetal growth restriction. I also informed her that with advancing gestation, placenta previa may resolve in some cases, and reassessment at 43 and 35 weeks is recommended.   Timing of delivery: In the absence of vaginal bleeding, we recommend delivery at 37 weeks. As about 50% of women with placenta previa have significant hemorrhage after 38 weeks, early term delivery is appropriate. Although this places the newborn at a small risk of having prematurity complications, maternal benefit far outweighs the neonatal risks. If the patient has vaginal bleeding at 36 weeks, delivery at 36 weeks is appropriate. Patient is aware that cesarean section would be performed. As vaginal bleeding increases the likelihood of anemia, iron supplements should be given.  Placenta previa can be associated with fetal growth restriction and we recommend serial growth assessments.  I advised patient to abstain from sexual intercourse.  Recommendations -An appointment was made for her to return in 4 weeks for completion of fetal anatomy. -Fetal growth assessments every 4 weeks. -Transvaginal evaluation for placental position as indicated.  Thank you for consultation.  If you have any questions or concerns, please contact me the [facility].  Consultation including face-to-face (more than 50%) counseling 30 minutes.

## 2022-05-15 ENCOUNTER — Ambulatory Visit (INDEPENDENT_AMBULATORY_CARE_PROVIDER_SITE_OTHER): Payer: 59 | Admitting: Obstetrics & Gynecology

## 2022-05-15 VITALS — BP 119/61 | HR 92 | Wt 140.0 lb

## 2022-05-15 DIAGNOSIS — O09219 Supervision of pregnancy with history of pre-term labor, unspecified trimester: Secondary | ICD-10-CM

## 2022-05-15 DIAGNOSIS — O3430 Maternal care for cervical incompetence, unspecified trimester: Secondary | ICD-10-CM

## 2022-05-15 DIAGNOSIS — Z3A24 24 weeks gestation of pregnancy: Secondary | ICD-10-CM

## 2022-05-15 DIAGNOSIS — O09299 Supervision of pregnancy with other poor reproductive or obstetric history, unspecified trimester: Secondary | ICD-10-CM

## 2022-05-15 DIAGNOSIS — O4400 Placenta previa specified as without hemorrhage, unspecified trimester: Secondary | ICD-10-CM

## 2022-05-15 DIAGNOSIS — O0990 Supervision of high risk pregnancy, unspecified, unspecified trimester: Secondary | ICD-10-CM

## 2022-05-15 NOTE — Patient Instructions (Signed)
Return to office for any scheduled appointments. Call the office or go to the MAU at [facility] if: You begin to have strong, frequent contractions Your water breaks.  Sometimes it is a big gush of fluid, sometimes it is just a trickle that keeps getting your underwear wet or running down your legs You have vaginal bleeding.  It is normal to have a small amount of spotting if your cervix was checked.  You do not feel your baby moving like normal.  If you do not, get something to eat and drink and lay down and focus on feeling your baby move.   If your baby is still not moving like normal, you should call the office or go to MAU. Any other obstetric concerns.   TDaP Vaccine Pregnancy Get the Whooping Cough Vaccine While You Are Pregnant (CDC)  It is important for women to get the whooping cough vaccine in the third trimester of each pregnancy. Vaccines are the best way to prevent this disease. There are 2 different whooping cough vaccines. Both vaccines combine protection against whooping cough, tetanus and diphtheria, but they are for different age groups: Tdap: for everyone 11 years or older, including pregnant women  DTaP: for children 2 months through 6 years of age  You need the whooping cough vaccine during each of your pregnancies The recommended time to get the shot is during your 27th through 36th week of pregnancy, preferably during the earlier part of this time period. The Centers for Disease Control and Prevention (CDC) recommends that pregnant women receive the whooping cough vaccine for adolescents and adults (called Tdap vaccine) during the third trimester of each pregnancy. The recommended time to get the shot is during your 27th through 36th week of pregnancy, preferably during the earlier part of this time period. This replaces the original recommendation that pregnant women get the vaccine only if they had not previously received it. The American  College of Obstetricians and Gynecologists and the American College of Nurse-Midwives support this recommendation.  You should get the whooping cough vaccine while pregnant to pass protection to your baby frame support disabled and/or not supported in this browser  Learn why Sally Wiley decided to get the whooping cough vaccine in her 3rd trimester of pregnancy and how her baby girl was born with some protection against the disease. Also available on YouTube. After receiving the whooping cough vaccine, your body will create protective antibodies (proteins produced by the body to fight off diseases) and pass some of them to your baby before birth. These antibodies provide your baby some short-term protection against whooping cough in early life. These antibodies can also protect your baby from some of the more serious complications that come along with whooping cough. Your protective antibodies are at their highest about 2 weeks after getting the vaccine, but it takes time to pass them to your baby. So the preferred time to get the whooping cough vaccine is early in your third trimester. The amount of whooping cough antibodies in your body decreases over time. That is why CDC recommends you get a whooping cough vaccine during each pregnancy. Doing so allows each of your babies to get the greatest number of protective antibodies from you. This means each of your babies will get the best protection possible against this disease.  Getting the whooping cough vaccine while pregnant is better than getting the vaccine after you give birth Whooping cough vaccination during pregnancy is ideal so   your baby will have short-term protection as soon as he is born. This early protection is important because your baby will not start getting his whooping cough vaccines until he is 2 months old. These first few months of life are when your baby is at greatest risk for catching whooping cough. This is also when he's at greatest  risk for having severe, potentially life-threating complications from the infection. To avoid that gap in protection, it is best to get a whooping cough vaccine during pregnancy. You will then pass protection to your baby before he is born. To continue protecting your baby, he should get whooping cough vaccines starting at 2 months old. You may never have gotten the Tdap vaccine before and did not get it during this pregnancy. If so, you should make sure to get the vaccine immediately after you give birth, before leaving the hospital or birthing center. It will take about 2 weeks before your body develops protection (antibodies) in response to the vaccine. Once you have protection from the vaccine, you are less likely to give whooping cough to your newborn while caring for him. But remember, your baby will still be at risk for catching whooping cough from others. A recent study looked to see how effective Tdap was at preventing whooping cough in babies whose mothers got the vaccine while pregnant or in the hospital after giving birth. The study found that getting Tdap between 27 through 36 weeks of pregnancy is 85% more effective at preventing whooping cough in babies younger than 2 months old. Blood tests cannot tell if you need a whooping cough vaccine There are no blood tests that can tell you if you have enough antibodies in your body to protect yourself or your baby against whooping cough. Even if you have been sick with whooping cough in the past or previously received the vaccine, you still should get the vaccine during each pregnancy. Breastfeeding may pass some protective antibodies onto your baby By breastfeeding, you may pass some antibodies you have made in response to the vaccine to your baby. When you get a whooping cough vaccine during your pregnancy, you will have antibodies in your breast milk that you can share with your baby as soon as your milk comes in. However, your baby will not get  protective antibodies immediately if you wait to get the whooping cough vaccine until after delivering your baby. This is because it takes about 2 weeks for your body to create antibodies. Learn more about the health benefits of breastfeeding.  

## 2022-05-15 NOTE — Progress Notes (Signed)
PRENATAL VISIT NOTE  Subjective:  Sally Wiley is a 36 y.o. G3P0201 at [redacted]w[redacted]d being seen today for ongoing prenatal care.  She is currently monitored for the following issues for this high-risk pregnancy and has History of cervical incompetence in pregnancy, currently pregnant; Cervical cerclage suture present, antepartum; Previous preterm delivery, antepartum; Supervision of high risk pregnancy, antepartum; History of preterm premature rupture of membranes (PPROM); and Placenta previa antepartum on their problem list.  Patient reports no complaints.  Accompanied by her husband and son.  Contractions: Not present. Vag. Bleeding: None.  Movement: Present. Denies leaking of fluid.   The following portions of the patient's history were reviewed and updated as appropriate: allergies, current medications, past family history, past medical history, past social history, past surgical history and problem list.   Objective:   Vitals:   05/15/22 1031  BP: 119/61  Pulse: 92  Weight: 140 lb (63.5 kg)    Fetal Status: Fetal Heart Rate (bpm): 155   Movement: Present     General:  Alert, oriented and cooperative. Patient is in no acute distress.  Skin: Skin is warm and dry. No rash noted.   Cardiovascular: Normal heart rate noted  Respiratory: Normal respiratory effort, no problems with respiration noted  Abdomen: Soft, gravid, appropriate for gestational age.  Pain/Pressure: Present     Pelvic: Cervical exam deferred        Extremities: Normal range of motion.  Edema: None  Mental Status: Normal mood and affect. Normal behavior. Normal judgment and thought content.   Imaging: [place] MFM OB DETAIL +14 WK  Result Date: 05/06/2022 ----------------------------------------------------------------------  OBSTETRICS REPORT                       (Signed Final 05/06/2022 04:40 pm) ---------------------------------------------------------------------- Patient Info  ID #:       [id number]                           D.O.B.:  November 29, 1985 (35 yrs)  Name:       Sally Wiley              Visit Date: 05/06/2022 01:00 pm ---------------------------------------------------------------------- Performed By  Attending:        Tama High MD        Ref. Address:     [place]  Performed By:  Tori Hutchens          [facility]                                                             [place] [facility]                                                             [facility]  Referred By:      Truett Mainland                    MD ---------------------------------------------------------------------- Orders  #  Description                           Code        Ordered By  1  [place] MFM OB DETAIL +14 [place]               76811.01    Loma Boston  2  [place] MFM OB TRANSVAGINAL                [id number]     JACOB STINSON ----------------------------------------------------------------------  #  Order #                     Accession #                Episode #  1  [id number]                   [id number]                 [id number]  2  [id number]                   [id number]                 [id number] ---------------------------------------------------------------------- Indications  Cervical incompetence, second trimester        O34.32  (Cerclage)  Placenta previa specified as without           O44.02  hemorrhage, second trimester  Encounter for antenatal screening for          Z36.3  malformations  Poor obstetric history (prior pre-term labor,  O09.219  PPROM, IUFD)  [redacted] weeks gestation of pregnancy                Z3A.23  LR NIPS ---------------------------------------------------------------------- Fetal Evaluation  Num Of Fetuses:         1  Fetal Heart  Rate(bpm):  144  Cardiac Activity:       Observed  Presentation:           Breech  Placenta:               Posterior Previa  P. Cord Insertion:      Visualized, central  Amniotic Fluid  AFI FV:      Within normal limits                              Largest Pocket(cm)                              4.87 ---------------------------------------------------------------------- Biometry  BPD:     55.74  mm     G. Age:  23w 0d         34  %    CI:        73.28   %    70 - 86                                                          FL/HC:      19.1   %    19.2 - 20.8  HC:    206.94   mm     G. Age:  22w 6d         18  %    HC/AC:      1.14        1.05 - 1.21  AC:    181.92   mm     G. Age:  23w 0d         33  %    FL/BPD:     70.9   %    71 - 87  FL:       39.5  mm     G. Age:  22w 5d         21  %    FL/AC:      21.7   %    20 - 24  HUM:      38.5  mm     G. Age:  23w 5d         49  %  CER:      27.2  mm     G. Age:  24w 2d         92  %  LV:        4.8  mm  CM:        5.1  mm  Est. FW:     543  gm      1 lb 3 oz     24  % ---------------------------------------------------------------------- OB History  Blood Type:   AB+  Gravidity:    3         Prem:   2  Living:       1 ---------------------------------------------------------------------- Gestational Age  LMP:           27w 0d        Date:  10/29/21                   EDD:   08/05/22  U/S Today:     22w 6d  EDD:   09/03/22  Best:          23w 2d     Det. By:  Previous Ultrasound      EDD:   08/31/22                                      (02/20/22) ---------------------------------------------------------------------- Anatomy  Cranium:               Appears normal         LVOT:                   Appears normal  Cavum:                 Appears normal         Aortic Arch:            Appears normal  Ventricles:            Appears normal         Ductal Arch:            Not well visualized  Choroid Plexus:        Appears normal         Diaphragm:               Appears normal  Cerebellum:            Appears normal         Stomach:                Appears normal, left                                                                        sided  Posterior Fossa:       Appears normal         Abdomen:                Appears normal  Nuchal Fold:           Not applicable ([id number]    Abdominal Wall:         Appears nml (cord                         wks GA)                                        insert, abd wall)  Face:                  Orbits and profile     Cord Vessels:           Appears normal (3                         previously seen                                vessel cord)  Lips:  Appears normal         Kidneys:                Appear normal  Palate:                Not well visualized    Bladder:                Appears normal  Thoracic:              Appears normal         Spine:                  Ltd views no                                                                        intracranial signs of                                                                        NTD  Heart:                 Appears normal         Upper Extremities:      Appears normal                         (4CH, axis, and                         situs)  RVOT:                  Appears normal         Lower Extremities:      Appears normal  Other:  Right open hand and 5th didgit visualized. VC, 3VV and 3VTV          visualized. Nasal bone visualized. Heels visualized.Fetal sex is          female. Left hand digits to be reassessed. ---------------------------------------------------------------------- Cervix Uterus Adnexa  Cervix  Length:            3.9  cm.  Normal appearance by transabdominal scan.  Uterus  No abnormality visualized.  Right Ovary  Within normal limits.  Left Ovary  Within normal limits.  Cul De Sac  No free fluid seen.  Adnexa  No abnormality visualized. ---------------------------------------------------------------------- Impression  We performed a fetal  anatomical survey.  Amniotic fluid is  normal and good fetal activity seen.  Fetal biometry is  consistent with the previously established dates.  No markers  of aneuploidies or fetal structural defects are seen.  We performed a transvaginal ultrasound to evaluate the  cervix, cerclage and placental position.  The cervix measures  3.1 cm and the cerclage is seen close to the external os.  Placenta previa is seen and the placental edge covers  internal os and extends anteriorly.  xxxxxxxxxxxxxxxxxxxxxxxxxxxxxxxxxxxxxxxxxxxxx  Consultation (see EPIC )  I had  the pleasure of seeing Ms. Joos today at the [place]  [facility]. She is G3 P0201 at 23w 2d gestation  and is here for fetal anatomy scan.  On cell-free fetal DNA screening, the risks of fetal  aneuploidies are not increased.  MSAFP screening showed  low risk for open neural tube defects.  Patient had prophylactic cerclage in this pregnancy at [redacted]  weeks gestation.  Obstetrical history significant for a 22-week pregnancy loss in  2020 of a female infant weighing 15 ounces at birth.  Unfortunately, the infant did not survive after birth.  Baby was  delivered by breech with a head entrapment that required  Duherssen's incision and cervical repair.  In 2021, patient had a spontaneous vaginal delivery at [redacted]  weeks gestation of a female infant weighing 3 pounds and 8  ounces at birth.  Her son is in good health.  She had  prophylactic cerclage in that pregnancy.  Patient reports no chronic medical conditions including  hypertension or diabetes.  Our concerns include  History of preterm deliveries  Patient is aware that progesterone injection [facility]) has  been withdrawn. I reassured the patient of normal cervical  length measurement.  Patient understands that cerclage  does not guarantee carrying pregnancy to term.  History of  preterm deliveries the most important cause of recurrent  preterm delivery.  Given that she has normal cervical length  measurement,  vaginal progesterone is not indicated.  Placenta previa  I explained the diagnosis with the help of ultrasound images. I  informed her that placenta previa is associated with recurrent  vaginal bleeding that would require inpatient management. If  recurrent bleeding occurs, patient may have to hospitalized  until delivery. Placenta previa is associated with increased  risk of preterm delivery and fetal growth restriction.  I also informed her that with advancing gestation, placenta  previa may resolve in some cases, and reassessment at 40  and 35 weeks is recommended.  Timing of delivery: In the absence of vaginal bleeding, we  recommend delivery at 37 weeks. As about 50% of women  with placenta previa have significant hemorrhage after 38  weeks, early term delivery is appropriate. Although this  places the newborn at a small risk of having prematurity  complications, maternal benefit far outweighs the neonatal  risks. If the patient has vaginal bleeding at 36 weeks, delivery  at 36 weeks is appropriate. Patient is aware that cesarean  section would be performed.  As vaginal bleeding increases the likelihood of anemia, iron  supplements should be given.  Placenta previa can be associated with fetal growth  restriction and we recommend serial growth assessments.  I advised patient to abstain from sexual intercourse. ---------------------------------------------------------------------- Recommendations  -An appointment was made for her to return in 4 weeks for  completion of fetal anatomy.  -Fetal growth assessments every 4 weeks.  -Transvaginal evaluation for placental position as indicated. ----------------------------------------------------------------------                 Tama High, MD Electronically Signed Final Report   05/06/2022 04:40 pm ----------------------------------------------------------------------  [place] MFM OB Transvaginal  Result Date:  05/06/2022 ----------------------------------------------------------------------  OBSTETRICS REPORT                       (Signed Final 05/06/2022 04:40 pm) ---------------------------------------------------------------------- Patient Info  ID #:       [id number]  D.O.B.:  09-Feb-1986 (35 yrs)  Name:       Sally Wiley              Visit Date: 05/06/2022 01:00 pm ---------------------------------------------------------------------- Performed By  Attending:        Tama High MD        Ref. Address:     [place]  Performed By:     Nevin Bloodgood          [facility]                                     [facility] at                                                             [place] [facility]                                                             Women  Referred By:      Truett Mainland                    MD ---------------------------------------------------------------------- Orders  #  Description                           Code        Ordered By  1  [place] MFM OB DETAIL +14 [place]               76811.01    Loma Boston  2  [place] MFM OB TRANSVAGINAL                [id number]     JACOB STINSON ----------------------------------------------------------------------  #  Order #  Accession #                Episode #  1  [id number]                   [id number]                 [id number]  2  [id number]                   [id number]                 [id number] ---------------------------------------------------------------------- Indications  Cervical incompetence, second trimester        O34.32  (Cerclage)  Placenta previa specified as without           O44.02  hemorrhage, second trimester  Encounter for antenatal screening for           Z36.3  malformations  Poor obstetric history (prior pre-term labor,  O09.219  PPROM, IUFD)  [redacted] weeks gestation of pregnancy                Z3A.23  LR NIPS ---------------------------------------------------------------------- Fetal Evaluation  Num Of Fetuses:         1  Fetal Heart Rate(bpm):  144  Cardiac Activity:       Observed  Presentation:           Breech  Placenta:               Posterior Previa  P. Cord Insertion:      Visualized, central  Amniotic Fluid  AFI FV:      Within normal limits                              Largest Pocket(cm)                              4.87 ---------------------------------------------------------------------- Biometry  BPD:     55.74  mm     G. Age:  23w 0d         34  %    CI:        73.28   %    70 - 86                                                          FL/HC:      19.1   %    19.2 - 20.8  HC:    206.94   mm     G. Age:  22w 6d         18  %    HC/AC:      1.14        1.05 - 1.21  AC:    181.92   mm     G. Age:  23w 0d         33  %    FL/BPD:     70.9   %    71 - 87  FL:       39.5  mm     G. Age:  22w 5d         21  %    FL/AC:  21.7   %    20 - 24  HUM:      38.5  mm     G. Age:  23w 5d         49  %  CER:      27.2  mm     G. Age:  24w 2d         92  %  LV:        4.8  mm  CM:        5.1  mm  Est. FW:     543  gm      1 lb 3 oz     24  % ---------------------------------------------------------------------- OB History  Blood Type:   AB+  Gravidity:    3         Prem:   2  Living:       1 ---------------------------------------------------------------------- Gestational Age  LMP:           27w 0d        Date:  10/29/21                   EDD:   08/05/22  U/S Today:     22w 6d                                        EDD:   09/03/22  Best:          23w 2d     Det. By:  Previous Ultrasound      EDD:   08/31/22                                      (02/20/22) ---------------------------------------------------------------------- Anatomy  Cranium:               Appears normal          LVOT:                   Appears normal  Cavum:                 Appears normal         Aortic Arch:            Appears normal  Ventricles:            Appears normal         Ductal Arch:            Not well visualized  Choroid Plexus:        Appears normal         Diaphragm:              Appears normal  Cerebellum:            Appears normal         Stomach:                Appears normal, left  sided  Posterior Fossa:       Appears normal         Abdomen:                Appears normal  Nuchal Fold:           Not applicable (>20    Abdominal Wall:         Appears nml (cord                         wks GA)                                        insert, abd wall)  Face:                  Orbits and profile     Cord Vessels:           Appears normal (3                         previously seen                                vessel cord)  Lips:                  Appears normal         Kidneys:                Appear normal  Palate:                Not well visualized    Bladder:                Appears normal  Thoracic:              Appears normal         Spine:                  Ltd views no                                                                        intracranial signs of                                                                        NTD  Heart:                 Appears normal         Upper Extremities:      Appears normal                         (4CH, axis, and  situs)  RVOT:                  Appears normal         Lower Extremities:      Appears normal  Other:  Right open hand and 5th didgit visualized. VC, 3VV and 3VTV          visualized. Nasal bone visualized. Heels visualized.Fetal sex is          female. Left hand digits to be reassessed. ---------------------------------------------------------------------- Cervix Uterus Adnexa  Cervix  Length:            3.9  cm.  Normal appearance by transabdominal scan.   Uterus  No abnormality visualized.  Right Ovary  Within normal limits.  Left Ovary  Within normal limits.  Cul De Sac  No free fluid seen.  Adnexa  No abnormality visualized. ---------------------------------------------------------------------- Impression  We performed a fetal anatomical survey.  Amniotic fluid is  normal and good fetal activity seen.  Fetal biometry is  consistent with the previously established dates.  No markers  of aneuploidies or fetal structural defects are seen.  We performed a transvaginal ultrasound to evaluate the  cervix, cerclage and placental position.  The cervix measures  3.1 cm and the cerclage is seen close to the external os.  Placenta previa is seen and the placental edge covers  internal os and extends anteriorly.  xxxxxxxxxxxxxxxxxxxxxxxxxxxxxxxxxxxxxxxxxxxxx  Consultation (see EPIC )  I had the pleasure of seeing Ms. Franta today at the [place]  [facility]. She is G3 P0201 at 23w 2d gestation  and is here for fetal anatomy scan.  On cell-free fetal DNA screening, the risks of fetal  aneuploidies are not increased.  MSAFP screening showed  low risk for open neural tube defects.  Patient had prophylactic cerclage in this pregnancy at [redacted]  weeks gestation.  Obstetrical history significant for a 22-week pregnancy loss in  2020 of a female infant weighing 15 ounces at birth.  Unfortunately, the infant did not survive after birth.  Baby was  delivered by breech with a head entrapment that required  Duherssen's incision and cervical repair.  In 2021, patient had a spontaneous vaginal delivery at [redacted]  weeks gestation of a female infant weighing 3 pounds and 8  ounces at birth.  Her son is in good health.  She had  prophylactic cerclage in that pregnancy.  Patient reports no chronic medical conditions including  hypertension or diabetes.  Our concerns include  History of preterm deliveries  Patient is aware that progesterone injection [facility]) has  been withdrawn. I reassured the  patient of normal cervical  length measurement.  Patient understands that cerclage  does not guarantee carrying pregnancy to term.  History of  preterm deliveries the most important cause of recurrent  preterm delivery.  Given that she has normal cervical length  measurement, vaginal progesterone is not indicated.  Placenta previa  I explained the diagnosis with the help of ultrasound images. I  informed her that placenta previa is associated with recurrent  vaginal bleeding that would require inpatient management. If  recurrent bleeding occurs, patient may have to hospitalized  until delivery. Placenta previa is associated with increased  risk of preterm delivery and fetal growth restriction.  I also informed her that with advancing gestation, placenta  previa may resolve in some cases, and reassessment at 53  and 35 weeks is recommended.  Timing of delivery: In the absence of vaginal bleeding, we  recommend delivery at 37 weeks. As about 50% of women  with placenta previa have significant hemorrhage after 38  weeks, early term delivery is appropriate. Although this  places the newborn at a small risk of having prematurity  complications, maternal benefit far outweighs the neonatal  risks. If the patient has vaginal bleeding at 36 weeks, delivery  at 36 weeks is appropriate. Patient is aware that cesarean  section would be performed.  As vaginal bleeding increases the likelihood of anemia, iron  supplements should be given.  Placenta previa can be associated with fetal growth  restriction and we recommend serial growth assessments.  I advised patient to abstain from sexual intercourse. ---------------------------------------------------------------------- Recommendations  -An appointment was made for her to return in 4 weeks for  completion of fetal anatomy.  -Fetal growth assessments every 4 weeks.  -Transvaginal evaluation for placental position as indicated.  ----------------------------------------------------------------------                 Tama High, MD Electronically Signed Final Report   05/06/2022 04:40 pm ----------------------------------------------------------------------   Assessment and Plan:  Pregnancy: [id number] at [redacted]w[redacted]d 1. Placenta previa antepartum Follow up scans as per MFM. Previa precautions reviewed.  2. Previous preterm delivery, antepartum 3. History of cervical incompetence in pregnancy, currently pregnant 4. Cervical cerclage suture present, antepartum Cerclage in place. PTL/PPROM precautions reviewed.  5. [redacted] weeks gestation of pregnancy 6. Supervision of high risk pregnancy, antepartum Third trimester labs next visit. Preterm labor symptoms and general obstetric precautions including but not limited to vaginal bleeding, contractions, leaking of fluid and fetal movement were reviewed in detail with the patient. Please refer to After Visit Summary for other counseling recommendations.   Return in about 4 weeks (around 06/12/2022) for 2 hr GTT, 3rd trimester labs, TDap, OFFICE OB VISIT (MD only).  Future Appointments  Date Time Provider [place]  05/30/2022  8:35 AM Truett Mainland, DO [facility] None  06/03/2022  3:30 PM [facility] NURSE [facility]  06/03/2022  3:45 PM [facility] US4 [facility]  06/14/2022 10:15 AM Truett Mainland, DO [facility] None  06/27/2022 10:15 AM Nehemiah Settle, Tanna Savoy, DO [facility] None    Verita Schneiders, MD

## 2022-05-28 ENCOUNTER — Inpatient Hospital Stay (HOSPITAL_BASED_OUTPATIENT_CLINIC_OR_DEPARTMENT_OTHER): Payer: 59

## 2022-05-28 ENCOUNTER — Other Ambulatory Visit: Payer: Self-pay

## 2022-05-28 ENCOUNTER — Inpatient Hospital Stay (HOSPITAL_COMMUNITY)
Admission: AD | Admit: 2022-05-28 | Discharge: 2022-06-01 | DRG: 831 | Disposition: A | Discharge status: Home / Self Care | Payer: 59 | Attending: Obstetrics and Gynecology | Admitting: Obstetrics and Gynecology

## 2022-05-28 ENCOUNTER — Encounter (HOSPITAL_COMMUNITY): Payer: Self-pay | Admitting: Obstetrics and Gynecology

## 2022-05-28 DIAGNOSIS — Z8759 Personal history of other complications of pregnancy, childbirth and the puerperium: Secondary | ICD-10-CM

## 2022-05-28 DIAGNOSIS — O4402 Placenta previa specified as without hemorrhage, second trimester: Secondary | ICD-10-CM | POA: Diagnosis not present

## 2022-05-28 DIAGNOSIS — O09212 Supervision of pregnancy with history of pre-term labor, second trimester: Secondary | ICD-10-CM | POA: Diagnosis not present

## 2022-05-28 DIAGNOSIS — O3432 Maternal care for cervical incompetence, second trimester: Secondary | ICD-10-CM

## 2022-05-28 DIAGNOSIS — Z3A26 26 weeks gestation of pregnancy: Secondary | ICD-10-CM | POA: Diagnosis not present

## 2022-05-28 DIAGNOSIS — O0992 Supervision of high risk pregnancy, unspecified, second trimester: Secondary | ICD-10-CM | POA: Diagnosis not present

## 2022-05-28 DIAGNOSIS — O4412 Placenta previa with hemorrhage, second trimester: Principal | ICD-10-CM | POA: Diagnosis present

## 2022-05-28 DIAGNOSIS — O4692 Antepartum hemorrhage, unspecified, second trimester: Secondary | ICD-10-CM

## 2022-05-28 DIAGNOSIS — O09219 Supervision of pregnancy with history of pre-term labor, unspecified trimester: Secondary | ICD-10-CM

## 2022-05-28 DIAGNOSIS — O09299 Supervision of pregnancy with other poor reproductive or obstetric history, unspecified trimester: Secondary | ICD-10-CM

## 2022-05-28 DIAGNOSIS — O3430 Maternal care for cervical incompetence, unspecified trimester: Secondary | ICD-10-CM | POA: Diagnosis present

## 2022-05-28 DIAGNOSIS — O0990 Supervision of high risk pregnancy, unspecified, unspecified trimester: Principal | ICD-10-CM

## 2022-05-28 LAB — WET PREP, GENITAL
Clue Cells Wet Prep HPF POC: NONE SEEN
Sperm: NONE SEEN
Trich, Wet Prep: NONE SEEN
WBC, Wet Prep HPF POC: <10 (ref <10)

## 2022-05-28 LAB — TYPE AND SCREEN
ABO/RH(D): AB POS
Antibody Screen: NEGATIVE

## 2022-05-28 LAB — CBC
HCT: 34.5 % — ABNORMAL LOW (ref 36.0–46.0)
Hemoglobin: 11.6 g/dL — ABNORMAL LOW (ref 12.0–15.0)
MCH: 31.2 pg (ref 26.0–34.0)
MCHC: 33.6 g/dL (ref 30.0–36.0)
MCV: 92.7 fL (ref 80.0–100.0)
Platelets: 281 10*3/uL (ref 150–400)
RBC: 3.72 MIL/uL — ABNORMAL LOW (ref 3.87–5.11)
RDW: 12.8 % (ref 11.5–15.5)
WBC: 13.5 10*3/uL — ABNORMAL HIGH (ref 4.0–10.5)
nRBC: 0 % (ref 0.0–0.2)

## 2022-05-28 LAB — DIC (DISSEMINATED INTRAVASCULAR COAGULATION)PANEL
D-Dimer, Quant: 1.83 ug/mL-FEU — ABNORMAL HIGH (ref 0.00–0.50)
Fibrinogen: 462 mg/dL (ref 210–475)
INR: 1 (ref 0.8–1.2)
Platelets: 289 10*3/uL (ref 150–400)
Prothrombin Time: 13.5 seconds (ref 11.4–15.2)
Smear Review: NONE SEEN
aPTT: 29 seconds (ref 24–36)

## 2022-05-28 LAB — RPR: RPR Ser Ql: NONREACTIVE

## 2022-05-28 MED ORDER — ACETAMINOPHEN 325 MG PO TABS
650.0000 mg | ORAL_TABLET | ORAL | Status: DC | PRN
  Filled 2022-05-28: qty 2

## 2022-05-28 MED ORDER — CALCIUM CARBONATE ANTACID 500 MG PO CHEW: 2.0000 | CHEWABLE_TABLET | ORAL | Status: DC | PRN

## 2022-05-28 MED ORDER — MAGNESIUM SULFATE BOLUS VIA INFUSION: 4.0000 g | Freq: Once | INTRAVENOUS | Status: DC

## 2022-05-28 MED ORDER — LACTATED RINGERS IV BOLUS
1000.0000 mL | Freq: Once | INTRAVENOUS | Status: AC
  Administered 2022-05-28: 1000 mL via INTRAVENOUS

## 2022-05-28 MED ORDER — MAGNESIUM SULFATE BOLUS VIA INFUSION
4.0000 g | Freq: Once | INTRAVENOUS | Status: AC
  Administered 2022-05-28: 4 g via INTRAVENOUS
  Filled 2022-05-28: qty 1000

## 2022-05-28 MED ORDER — BETAMETHASONE SOD PHOS & ACET 6 (3-3) MG/ML IJ SUSP: 12.0000 mg | Freq: Once | INTRAMUSCULAR | Status: DC

## 2022-05-28 MED ORDER — PRENATAL MULTIVITAMIN CH
1.0000 | ORAL_TABLET | Freq: Every day | ORAL | Status: DC
Start: 2022-05-28 — End: 2022-06-01
  Administered 2022-05-29 – 2022-05-31 (×3): 1 via ORAL
  Filled 2022-05-28 (×3): qty 1

## 2022-05-28 MED ORDER — DOCUSATE SODIUM 100 MG PO CAPS
100.0000 mg | ORAL_CAPSULE | Freq: Every day | ORAL | Status: DC
  Administered 2022-05-29 – 2022-05-31 (×3): 100 mg via ORAL
  Filled 2022-05-28 (×3): qty 1

## 2022-05-28 MED ORDER — BETAMETHASONE SOD PHOS & ACET 6 (3-3) MG/ML IJ SUSP
12.0000 mg | INTRAMUSCULAR | Status: AC
  Administered 2022-05-28 – 2022-05-29 (×2): 12 mg via INTRAMUSCULAR
  Filled 2022-05-28 (×2): qty 5

## 2022-05-28 MED ORDER — LACTATED RINGERS IV SOLN: INTRAVENOUS | Status: DC

## 2022-05-28 MED ORDER — ACETAMINOPHEN 325 MG PO TABS: 650.0000 mg | ORAL_TABLET | Freq: Once | ORAL | Status: DC

## 2022-05-28 MED ORDER — MAGNESIUM SULFATE 40 GM/1000ML IV SOLN
2.0000 g/h | INTRAVENOUS | Status: DC
  Administered 2022-05-28: 2 g/h via INTRAVENOUS
  Filled 2022-05-28 (×2): qty 1000

## 2022-05-28 MED ORDER — ZOLPIDEM TARTRATE 5 MG PO TABS: 5.0000 mg | ORAL_TABLET | Freq: Every evening | ORAL | Status: DC | PRN

## 2022-05-28 NOTE — MAU Provider Note (Signed)
History     CSN: [id number]  Arrival date and time: 05/28/22 1443   Event Date/Time   First Provider Initiated Contact with Patient 05/28/22 0831      Chief Complaint  Patient presents with   Vaginal Bleeding   Rupture of Membranes   Sally Wiley is a 36 y.o. G3P0201 at [redacted]w[redacted]d who receives care at [facility].  She presents today for Vaginal Bleeding.  She states she awoke around 0645 and noticed blood in her underwear.  She states she used the bathroom and continued to note bleeding on the tissue and in the toilet.  Patient denies cramping or pain.  She endorses fetal movement.  She states reports some questionable leaking stating that initially the blood appeared light pink and watery.  However, she states she can not be sure.  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 Occurred at [facility].         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   Bartholin's gland abscess 09/13/2011   Supervision of other normal pregnancy, antepartum 11/25/2019   BABYSCRIPTS PATIENT: [ ] Initial [x] 12 [x]  15 [ ] 20 [ ] 28 [ ] 32 [ ] 36 [ ] 38 [ ] 39 [ ] 40 Nursing Staff Provider Office Location [id number]  Dating   Language  English  Anatomy   Flu Vaccine  Declined 11/25/19 Genetic Screen  NIPS: low risk   AFP Neg TDaP vaccine   04-03-20 Hgb A1C or  GTT Early  Third trimester: Normal    Glucose, Fasting 65 - 91 mg/dL 75  Glucose, 1 hour 65 - 179 mg/dL  Glucose,     Past Surgical History:  Procedure Laterality Date   CERVICAL CERCLAGE N/A 12/16/2019   Procedure: CERCLAGE CERVICAL;  Surgeon: , MD;  Location: [facility] LD ORS;  Service:  Gynecology;  Laterality: N/A;   CERVICAL CERCLAGE N/A 03/07/2022   Procedure: CERCLAGE CERVICAL;  Surgeon: , MD;  Location: [facility] ORS;  Service: Gynecology;  Laterality: N/A;   DILATION AND CURETTAGE OF UTERUS      Family History  Problem Relation Age of Onset   Cancer Paternal Grandmother    Heart disease Mother    Hypertension Mother    Diabetes Neg Hx     Social History   Tobacco Use   Smoking status: Never   Smokeless tobacco: Never  Vaping Use   Vaping Use: Never used  Substance Use Topics   Alcohol use: Not Currently    Comment: socailly   Drug use: Never    Allergies:  Allergies  Allergen Reactions   Shellfish Allergy Hives    Medications Prior to Admission  Medication Sig Dispense Refill Last Dose   Prenatal Vit-Fe Fumarate-FA (MULTIVITAMIN-PRENATAL) 27-0.8 MG TABS tablet Take 1 tablet by mouth daily.   05/27/2022   acetaminophen (TYLENOL) 325 MG tablet Take 325 mg by mouth every 6 (six) hours as needed  for moderate pain.      traMADol (ULTRAM) 50 MG tablet Take 1-2 tablets (50-100 mg total) by mouth every 6 (six) hours as needed for severe pain or moderate pain. (Patient not taking: Reported on 04/17/2022) 15 tablet 0     Review of Systems  Gastrointestinal:  Negative for abdominal pain, constipation, diarrhea, nausea and vomiting.  Genitourinary:  Positive for vaginal bleeding and vaginal discharge (White mucousy d/c occassionally.). Negative for difficulty urinating and dysuria.  Musculoskeletal:  Positive for back pain (Intermittent-Lower).  Neurological:  Negative for dizziness, light-headedness and headaches.   Physical Exam   Blood pressure 109/65, pulse 88, temperature 98.4 F (36.9 C), temperature source Oral, resp. rate 15, height 5\' 3"  (1.6 m), weight 63.6 kg, last menstrual period 10/29/2021, SpO2 100 %.  Physical Exam Vitals reviewed. Exam conducted with a chaperone present.  Constitutional:      General: She is not in acute  distress.    Appearance: Normal appearance.  HENT:     Head: Normocephalic and atraumatic.  Eyes:     Conjunctiva/sclera: Conjunctivae normal.  Cardiovascular:     Rate and Rhythm: Normal rate.     Pulses: Normal pulses.     Heart sounds: Normal heart sounds.  Pulmonary:     Effort: Pulmonary effort is normal. No respiratory distress.  Abdominal:     Tenderness: There is no abdominal tenderness.  Genitourinary:    Comments: Sterile Speculum Exam: -Normal External Genitalia: Non tender, Dark red blood noted at introitus. Bartholin gland cyst on right labia. ~ 4cm -Vaginal Vault: Pink mucosa with good rugae. Moderate amt dark red blood with white clumps noted. Apricot sized clot removed. Blood removed with faux swab x 4 -wet prep collected -Cervix:Pink, no lesions, cysts, or polyps.  Appears closed, but with active bleed vs bloody fluid. Cerclage not visualized, but no apparent stress or dilation of cervix noted. -Bimanual Exam: Deferred Musculoskeletal:        General: Normal range of motion.     Cervical back: Normal range of motion.  Skin:    General: Skin is warm and dry.  Neurological:     Mental Status: She is alert and oriented to person, place, and time.  Psychiatric:        Mood and Affect: Mood normal.        Behavior: Behavior normal.    Fetal Assessment 140 bpm, Mod Var, -Decels, +Accels Toco: No ctx graphed  MAU Course   Results for orders placed or performed during the hospital encounter of 05/28/22 (from the past 24 hour(s))  Wet prep, genital     Status: Abnormal   Collection Time: 05/28/22  8:44 AM  Result Value Ref Range   Yeast Wet Prep HPF POC PRESENT (A) NONE SEEN   Trich, Wet Prep NONE SEEN NONE SEEN   Clue Cells Wet Prep HPF POC NONE SEEN NONE SEEN   WBC, Wet Prep HPF POC <10 <10   Sperm NONE SEEN   CBC     Status: Abnormal   Collection Time: 05/28/22  9:04 AM  Result Value Ref Range   WBC 13.5 (H) 4.0 - 10.5 K/uL   RBC 3.72 (L) 3.87 - 5.11  MIL/uL   Hemoglobin 11.6 (L) 12.0 - 15.0 g/dL   HCT 07/28/22 (L) 67.6 - 19.5 %   MCV 92.7 80.0 - 100.0 fL   MCH 31.2 26.0 - 34.0 pg   MCHC 33.6 30.0 - 36.0 g/dL   RDW 09.3 26.7 - 12.4 %  Platelets 281 150 - 400 K/uL   nRBC 0.0 0.0 - 0.2 %  DIC Panel ONCE - STAT     Status: None (Preliminary result)   Collection Time: 05/28/22  9:04 AM  Result Value Ref Range   Prothrombin Time PENDING 11.4 - 15.2 seconds   INR PENDING 0.8 - 1.2   aPTT PENDING 24 - 36 seconds   Fibrinogen PENDING 210 - 475 mg/dL   D-Dimer, Quant PENDING 0.00 - 0.50 ug/mL-FEU   Platelets 289 150 - 400 K/uL   Smear Review NO SCHISTOCYTES SEEN   Type and screen [facility]     Status: None   Collection Time: 05/28/22  9:04 AM  Result Value Ref Range   ABO/RH(D) AB POS    Antibody Screen NEG    Sample Expiration      05/31/2022,2359 Performed at [facility], [address], [place], [place] [address]    No results found.  MDM PE Labs: Wet Prep, GC/CT EFM Ultrasound Assessment and Plan  36 year old G3P0201  SIUP at 26.3 weeks Cat I FT Vaginal Bleeding Known Previa Cerclage in Place  -POC Reviewed -Exam performed and findings discussed. -Cultures collected. -Reassured that bleeding is active, but not heavy. -Informed that next steps to get BSUS and contact MD. -Plan to admit to antepartum. -Dr. Hazle Coca consulted and informed of patient status, evaluation, interventions, and results. Advised: *Start IV x 2 *Obtain labs: DIC, CBC, and other admission labs *MgSO4 Bolus *BMZ Dosing *Will come to bedside to complete exam.  -Nurse updated on POC.   Cherre Robins MSN, CNM 05/28/2022, 8:31 AM

## 2022-05-28 NOTE — H&P (Signed)
FACULTY PRACTICE ANTEPARTUM ADMISSION HISTORY AND PHYSICAL NOTE   History of Present Illness: Sally Wiley is a 36 y.o. G3P0201 at 4583w3d admitted for vaginal bleeding with current posterior placenta previa and cervical cerclage.  Pt has known history of placenta previa.  Due to hx of previous preterm birth  and incompetent cervix, pt had cerclage placed on 03/07/22 at 14 weeks.  Pt went to restroom at 0645 this morning and noted vaginal bleeding and possible loss of fluid.  Bleeding was painless and pt denied uterine activity.  She continue to decline uterine contractions.   Patient reports the fetal movement as active. Patient reports uterine contraction  activity as none. Patient reports  vaginal bleeding as flow about like a period. Patient notes some loss of fluid. Fetal presentation is cephalic.  Patient Active Problem List   Diagnosis Date Noted   Placenta previa with hemorrhage in second trimester 05/28/2022   [redacted] weeks gestation of pregnancy 05/28/2022   Placenta previa antepartum 05/06/2022   Supervision of high risk pregnancy, antepartum 01/17/2022   History of preterm premature rupture of membranes (PPROM) 01/17/2022   Previous preterm delivery, antepartum 04/17/2020   Cervical cerclage suture present, antepartum 12/16/2019   History of cervical incompetence in pregnancy, currently pregnant 08/12/2019    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   Bartholin's gland abscess 09/13/2011   Supervision of other normal pregnancy, antepartum 11/25/2019   BABYSCRIPTS PATIENT: [ ] Initial [x] 12 [x]  15 [ ] 20 [ ] 28 [ ] 32 [ ] 36 [ ] 38 [ ] 39 [ ] 40 Nursing Staff Provider Office Location [facility]  Dating   Language  English  Anatomy US   Flu Vaccine  Declined 11/25/19 Genetic Screen  NIPS: low risk   AFP Neg TDaP vaccine   04-03-20 Hgb A1C or  GTT Early  Third trimester: Normal    Glucose, Fasting 65 - 91 mg/dL 75  Glucose, 1 hour 65 - 179 mg/dL [id number]  Glucose,      Past Surgical History:  Procedure Laterality Date   CERVICAL CERCLAGE N/A 12/16/2019   Procedure: CERCLAGE CERVICAL;  Surgeon: [place] BingPickens, Charlie, MD;  Location: [facility];  Service: Gynecology;  Laterality: N/A;   CERVICAL CERCLAGE N/A 03/07/2022   Procedure: CERCLAGE CERVICAL;  Surgeon: Adam PhenixArnold, James G, MD;  Location: [facility];  Service: Gynecology;  Laterality: N/A;   DILATION AND CURETTAGE OF UTERUS      OB History  Gravida Para Term Preterm AB Living  3 2   2   1   SAB IAB Ectopic Multiple Live Births        0 1    # Outcome Date GA Lbr Len/2nd Weight Sex Delivery Anes PTL Lv  3 Current           2 Preterm 04/17/20 6461w2d 01:56 / 00:14 1610 g M Vag-Spont EPI  LIV  1 Preterm 06/22/19 4058w3d  439 g M   Y FD    Obstetric Comments  Had cervical incompetence with first pregnancy, delivered at 22wks, breech with head entrapment with subsequent Duhrssen's Incision and cervical repair. [facility]    Social History   Socioeconomic History   Marital status: Single    Spouse name: Not on file   Number of children: Not on file   Years of education: Not on file   Highest education level: Not on file  Occupational History   Not on file  Tobacco Use   Smoking status:  Never   Smokeless tobacco: Never  Vaping Use   Vaping Use: Never used  Substance and Sexual Activity   Alcohol use: Not Currently    Comment: socailly   Drug use: Never   Sexual activity: Not Currently  Other Topics Concern   Not on file  Social History Narrative   Not on file   Social Determinants of Health   Financial Resource Strain: Not on file  Food Insecurity: Not on file  Transportation Needs: Not on file  Physical Activity: Not on file  Stress: Not on file  Social Connections: Not on file    Family History  Problem Relation Age of Onset   Cancer Paternal Grandmother    Heart disease Mother    Hypertension Mother    Diabetes Neg Hx     Allergies  Allergen Reactions    Shellfish Allergy Hives    Medications Prior to Admission  Medication Sig Dispense Refill Last Dose   Prenatal Vit-Fe Fumarate-FA (MULTIVITAMIN-PRENATAL) 27-0.8 MG TABS tablet Take 1 tablet by mouth daily.   05/27/2022   acetaminophen (TYLENOL) 325 MG tablet Take 325 mg by mouth every 6 (six) hours as needed for moderate pain.      traMADol (ULTRAM) 50 MG tablet Take 1-2 tablets (50-100 mg total) by mouth every 6 (six) hours as needed for severe pain or moderate pain. (Patient not taking: Reported on 04/17/2022) 15 tablet 0     Review of Systems - History obtained from the patient  Vitals:  BP 109/65 (BP Location: Right Arm)   Pulse 88   Temp 98.4 F (36.9 C) (Oral)   Resp 15   Ht 5\' 3"  (1.6 m)   Wt 63.6 kg   LMP 10/29/2021   SpO2 100%   BMI 24.85 kg/m  Physical Examination: CONSTITUTIONAL: Well-developed, well-nourished female in no acute distress.  HENT:  Normocephalic, atraumatic, External right and left ear normal. Oropharynx is clear and moist EYES: Conjunctivae and EOM are normal.  NECK: Normal range of motion, supple, no masses SKIN: Skin is warm and dry. No rash noted. Not diaphoretic. No erythema. No pallor. NEUROLGIC: Alert and oriented to person, place, and time. Normal reflexes, muscle tone coordination. No cranial nerve deficit noted. PSYCHIATRIC: Normal mood and affect. Normal behavior. Normal judgment and thought content. CARDIOVASCULAR: Normal heart rate noted, regular rhythm RESPIRATORY: Effort and breath sounds normal, no problems with respiration noted ABDOMEN: Soft, nontender, nondistended, gravid. MUSCULOSKELETAL: Normal range of motion. No edema and no tenderness. 2+ distal pulses.  Cervix: visually closed during sterile speculum exam.  No active bleeding noted.  Traces of red blood noted in the vagina and around cervix No pooling noted.  Cerclage strings appear intact and not on tension Fetal Monitoring:Baseline: 130's bpm, Variability: Good {> 6 bpm),  Accelerations: Non-reactive but appropriate for gestational age, and Decelerations: Variable: mild Tocometer: Flat  Ferning :  negative Bedside u/s:  vtx presentation, apparent fluid around fetus noted Labs:  Results for orders placed or performed during the hospital encounter of 05/28/22 (from the past 24 hour(s))  Wet prep, genital   Collection Time: 05/28/22  8:44 AM  Result Value Ref Range   Yeast Wet Prep HPF POC PRESENT (A) NONE SEEN   Trich, Wet Prep NONE SEEN NONE SEEN   Clue Cells Wet Prep HPF POC NONE SEEN NONE SEEN   WBC, Wet Prep HPF POC <10 <10   Sperm NONE SEEN     Imaging Studies: [place] MFM OB DETAIL +14 WK  Result Date: 05/06/2022 ----------------------------------------------------------------------  OBSTETRICS REPORT                       (Signed Final 05/06/2022 04:40 pm) ---------------------------------------------------------------------- Patient Info  ID #:       [id number]                          D.O.B.:  [birth year] (35 yrs)  Name:       Sally Millin              Visit Date: 05/06/2022 01:00 pm ---------------------------------------------------------------------- Performed By  Attending:        Noralee Space MD        Ref. Address:     [address]                                                             [place]                                                             [id number]  Performed By:     Anabel Halon          [facility]  Referred By:      Levie Heritage                    MD ---------------------------------------------------------------------- Orders  #  Description                           Code  Ordered By  1  [place] MFM OB DETAIL +14 WK                [id number]    Candelaria Celeste  2  [place] MFM OB TRANSVAGINAL                16109.6     Candelaria Celeste ----------------------------------------------------------------------  #  Order #                     Accession #                Episode #  1  [id number]                   [id number]                 [id number]  2  [id number]                   [id number]                 [id number] ---------------------------------------------------------------------- Indications  Cervical incompetence, second trimester        O34.32  (Cerclage)  Placenta previa specified as without           O44.02  hemorrhage, second trimester  Encounter for antenatal screening for          Z36.3  malformations  Poor obstetric history (prior pre-term labor,  O09.219  PPROM, IUFD)  [redacted] weeks gestation of pregnancy                Z3A.23  LR NIPS ---------------------------------------------------------------------- Fetal Evaluation  Num Of Fetuses:         1  Fetal Heart Rate(bpm):  144  Cardiac Activity:       Observed  Presentation:           Breech  Placenta:               Posterior Previa  P. Cord Insertion:      Visualized, central  Amniotic Fluid  AFI FV:      Within normal limits                              Largest Pocket(cm)                              4.87 ---------------------------------------------------------------------- Biometry  BPD:     55.74  mm     G. Age:  23w 0d         34  %    CI:        73.28   %    70 - 86                                                          FL/HC:      19.1   %    19.2 - 20.8  HC:    206.94   mm     G. Age:  22w 6d         18  %    HC/AC:      1.14        1.05 -  1.21  AC:    181.92   mm     G. Age:  23w 0d         33  %    FL/BPD:     70.9   %    71 - 87  FL:       39.5  mm     G. Age:  22w 5d         21  %    FL/AC:      21.7   %    20 - 24  HUM:      38.5  mm     G. Age:  23w 5d         49  %  CER:      27.2  mm     G. Age:  24w 2d         92  %  LV:        4.8  mm  CM:        5.1  mm  Est. FW:     543  gm       1 lb 3 oz     24  % ---------------------------------------------------------------------- OB History  Blood Type:   AB+  Gravidity:    3         Prem:   2  Living:       1 ---------------------------------------------------------------------- Gestational Age  LMP:           27w 0d        Date:  10/29/21                   EDD:   08/05/22  U/S Today:     22w 6d                                        EDD:   09/03/22  Best:          23w 2d     Det. By:  Previous Ultrasound      EDD:   08/31/22                                      (02/20/22) ---------------------------------------------------------------------- Anatomy  Cranium:               Appears normal         LVOT:                   Appears normal  Cavum:                 Appears normal         Aortic Arch:            Appears normal  Ventricles:            Appears normal         Ductal Arch:            Not well visualized  Choroid Plexus:        Appears normal         Diaphragm:              Appears normal  Cerebellum:            Appears normal  Stomach:                Appears normal, left                                                                        sided  Posterior Fossa:       Appears normal         Abdomen:                Appears normal  Nuchal Fold:           Not applicable (>20    Abdominal Wall:         Appears nml (cord                         wks GA)                                        insert, abd wall)  Face:                  Orbits and profile     Cord Vessels:           Appears normal (3                         previously seen                                vessel cord)  Lips:                  Appears normal         Kidneys:                Appear normal  Palate:                Not well visualized    Bladder:                Appears normal  Thoracic:              Appears normal         Spine:                  Ltd views no                                                                        intracranial signs of  NTD  Heart:                 Appears normal         Upper Extremities:      Appears normal                         (4CH, axis, and                         situs)  RVOT:                  Appears normal         Lower Extremities:      Appears normal  Other:  Right open hand and 5th didgit visualized. VC, 3VV and 3VTV          visualized. Nasal bone visualized. Heels visualized.Fetal sex is          female. Left hand digits to be reassessed. ---------------------------------------------------------------------- Cervix Uterus Adnexa  Cervix  Length:            3.9  cm.  Normal appearance by transabdominal scan.  Uterus  No abnormality visualized.  Right Ovary  Within normal limits.  Left Ovary  Within normal limits.  Cul De Sac  No free fluid seen.  Adnexa  No abnormality visualized. ---------------------------------------------------------------------- Impression  We performed a fetal anatomical survey.  Amniotic fluid is  normal and good fetal activity seen.  Fetal biometry is  consistent with the previously established dates.  No markers  of aneuploidies or fetal structural defects are seen.  We performed a transvaginal ultrasound to evaluate the  cervix, cerclage and placental position.  The cervix measures  3.1 cm and the cerclage is seen close to the external os.  Placenta previa is seen and the placental edge covers  internal os and extends anteriorly.  xxxxxxxxxxxxxxxxxxxxxxxxxxxxxxxxxxxxxxxxxxxxx  Consultation (see EPIC )  I had the pleasure of [facility]. She is G3 P0201 at 23w 2d gestation  and is here for fetal anatomy scan.  On cell-free fetal DNA screening, the risks of fetal  aneuploidies are not increased.  MSAFP screening showed  low risk for open neural tube defects.  Patient had prophylactic cerclage in this pregnancy at [redacted]  weeks gestation.  Obstetrical history significant for a 22-week pregnancy loss in   2020 of a female infant weighing 15 ounces at birth.  Unfortunately, the infant did not survive after birth.  Baby was  delivered by breech with a head entrapment that required  Duherssen's incision and cervical repair.  In 2021, patient had a spontaneous vaginal delivery at [redacted]  weeks gestation of a female infant weighing 3 pounds and 8  ounces at birth.  Her son is in good health.  She had  prophylactic cerclage in that pregnancy.  Patient reports no chronic medical conditions including  hypertension or diabetes.  Our concerns include  History of preterm deliveries  Patient is aware that progesterone injection [facility]) has  been withdrawn. I reassured the patient of normal cervical  length measurement.  Patient understands that cerclage  does not guarantee carrying pregnancy to term.  History of  preterm deliveries the most important cause of recurrent  preterm delivery.  Given that she has normal cervical length  measurement, vaginal progesterone is not indicated.  Placenta previa  I explained the diagnosis with the help  of ultrasound images. I  informed her that placenta previa is associated with recurrent  vaginal bleeding that would require inpatient management. If  recurrent bleeding occurs, patient may have to hospitalized  until delivery. Placenta previa is associated with increased  risk of preterm delivery and fetal growth restriction.  I also informed her that with advancing gestation, placenta  previa may resolve in some cases, and reassessment at 57  and 35 weeks is recommended.  Timing of delivery: In the absence of vaginal bleeding, we  recommend delivery at 37 weeks. As about 50% of women  with placenta previa have significant hemorrhage after 38  weeks, early term delivery is appropriate. Although this  places the newborn at a small risk of having prematurity  complications, maternal benefit far outweighs the neonatal  risks. If the patient has vaginal bleeding at 36 weeks, delivery  at 36 weeks is  appropriate. Patient is aware that cesarean  section would be performed.  As vaginal bleeding increases the likelihood of anemia, iron  supplements should be given.  Placenta previa can be associated with fetal growth  restriction and we recommend serial growth assessments.  I advised patient to abstain from sexual intercourse. ---------------------------------------------------------------------- Recommendations  -An appointment was made for her to return in 4 weeks for  completion of fetal anatomy.  -Fetal growth assessments every 4 weeks.  -Transvaginal evaluation for placental position as indicated. ----------------------------------------------------------------------                 Noralee Space, MD Electronically Signed Final Report   05/06/2022 04:40 pm ----------------------------------------------------------------------  [place] MFM OB Transvaginal  Result Date: 05/06/2022 ----------------------------------------------------------------------  OBSTETRICS REPORT                       (Signed Final 05/06/2022 04:40 pm) ---------------------------------------------------------------------- Patient Info  ID #:       [id number]                          D.O.B.:  July 24, 1986 (35 yrs)  Name:       Sally Millin              Visit Date: 05/06/2022 01:00 pm ---------------------------------------------------------------------- Performed By  Attending:        Noralee Space MD        Ref. Address:     [address]                                                             [place]                                                             [address]  Performed By:  Tori Hutchens          [facility]  Referred By:       Levie Heritage                    MD ---------------------------------------------------------------------- Orders  #  Description                           Code        Ordered By  1  [place] MFM OB DETAIL +14 WK               76811.01    Candelaria Celeste  2  [place] MFM OB TRANSVAGINAL                16109.6     JACOB STINSON ----------------------------------------------------------------------  #  Order #                     Accession #                Episode #  1  [id number]                   [id number]                 [id number]  2  [id number]                   [id number]                 [id number] ---------------------------------------------------------------------- Indications  Cervical incompetence, second trimester        O34.32  (Cerclage)  Placenta previa specified as without           O44.02  hemorrhage, second trimester  Encounter for antenatal screening for          Z36.3  malformations  Poor obstetric history (prior pre-term labor,  O09.219  PPROM, IUFD)  [redacted] weeks gestation of pregnancy                Z3A.23  LR NIPS ---------------------------------------------------------------------- Fetal Evaluation  Num Of Fetuses:         1  Fetal Heart Rate(bpm):  144  Cardiac Activity:       Observed  Presentation:           Breech  Placenta:               Posterior Previa  P. Cord Insertion:      Visualized, central  Amniotic Fluid  AFI FV:      Within normal limits                              Largest Pocket(cm)                              4.87 ---------------------------------------------------------------------- Biometry  BPD:     55.74  mm     G. Age:  23w 0d         34  %    CI:        73.28   %    70 - 86                                                          FL/HC:      19.1   %    19.2 - 20.8  HC:    206.94   mm     G. Age:  22w 6d         18  %    HC/AC:      1.14        1.05 - 1.21  AC:    181.92   mm     G. Age:  23w 0d         33  %    FL/BPD:     70.9   %    71 - 87  FL:       39.5  mm     G. Age:  22w  5d         21  %    FL/AC:      21.7   %    20 - 24  HUM:      38.5  mm     G. Age:  23w 5d         49  %  CER:      27.2  mm     G. Age:  24w 2d         92  %  LV:        4.8  mm  CM:        5.1  mm  Est. FW:     543  gm      1 lb 3 oz     24  % ---------------------------------------------------------------------- OB History  Blood Type:   AB+  Gravidity:    3         Prem:   2  Living:       1 ---------------------------------------------------------------------- Gestational Age  LMP:           27w 0d        Date:  10/29/21                   EDD:   08/05/22  U/S Today:     22w 6d  EDD:   09/03/22  Best:          23w 2d     Det. By:  Previous Ultrasound      EDD:   08/31/22                                      (02/20/22) ---------------------------------------------------------------------- Anatomy  Cranium:               Appears normal         LVOT:                   Appears normal  Cavum:                 Appears normal         Aortic Arch:            Appears normal  Ventricles:            Appears normal         Ductal Arch:            Not well visualized  Choroid Plexus:        Appears normal         Diaphragm:              Appears normal  Cerebellum:            Appears normal         Stomach:                Appears normal, left                                                                        sided  Posterior Fossa:       Appears normal         Abdomen:                Appears normal  Nuchal Fold:           Not applicable (>20    Abdominal Wall:         Appears nml (cord                         wks GA)                                        insert, abd wall)  Face:                  Orbits and profile     Cord Vessels:           Appears normal (3                         previously seen                                vessel cord)  Lips:  Appears normal         Kidneys:                Appear normal  Palate:                Not well visualized    Bladder:                 Appears normal  Thoracic:              Appears normal         Spine:                  Ltd views no                                                                        intracranial signs of                                                                        NTD  Heart:                 Appears normal         Upper Extremities:      Appears normal                         (4CH, axis, and                         situs)  RVOT:                  Appears normal         Lower Extremities:      Appears normal  Other:  Right open hand and 5th didgit visualized. VC, 3VV and 3VTV          visualized. Nasal bone visualized. Heels visualized.Fetal sex is          female. Left hand digits to be reassessed. ---------------------------------------------------------------------- Cervix Uterus Adnexa  Cervix  Length:            3.9  cm.  Normal appearance by transabdominal scan.  Uterus  No abnormality visualized.  Right Ovary  Within normal limits.  Left Ovary  Within normal limits.  Cul De Sac  No free fluid seen.  Adnexa  No abnormality visualized. ---------------------------------------------------------------------- Impression  We performed a fetal anatomical survey.  Amniotic fluid is  normal and good fetal activity seen.  Fetal biometry is  consistent with the previously established dates.  No markers  of aneuploidies or fetal structural defects are seen.  We performed a transvaginal ultrasound to evaluate the  cervix, cerclage and placental position.  The cervix measures  3.1 cm and the cerclage is seen close to the external os.  Placenta previa is seen and the placental edge covers  internal os and extends anteriorly.  xxxxxxxxxxxxxxxxxxxxxxxxxxxxxxxxxxxxxxxxxxxxx  Consultation (see EPIC )  I had  the pleasure of seeing [facility]. Yzaguirre [facility]. She is G3 P0201 at 23w 2d gestation  and is here for fetal anatomy scan.  On cell-free fetal DNA screening, the risks of fetal  aneuploidies  are not increased.  MSAFP screening showed  low risk for open neural tube defects.  Patient had prophylactic cerclage in this pregnancy at [redacted]  weeks gestation.  Obstetrical history significant for a 22-week pregnancy loss in  2020 of a female infant weighing 15 ounces at birth.  Unfortunately, the infant did not survive after birth.  Baby was  delivered by breech with a head entrapment that required  Duherssen's incision and cervical repair.  In 2021, patient had a spontaneous vaginal delivery at [redacted]  weeks gestation of a female infant weighing 3 pounds and 8  ounces at birth.  Her son is in good health.  She had  prophylactic cerclage in that pregnancy.  Patient reports no chronic medical conditions including  hypertension or diabetes.  Our concerns include  History of preterm deliveries  Patient is aware that progesterone injection [facility]) has  been withdrawn. I reassured the patient of normal cervical  length measurement.  Patient understands that cerclage  does not guarantee carrying pregnancy to term.  History of  preterm deliveries the most important cause of recurrent  preterm delivery.  Given that she has normal cervical length  measurement, vaginal progesterone is not indicated.  Placenta previa  I explained the diagnosis with the help of ultrasound images. I  informed her that placenta previa is associated with recurrent  vaginal bleeding that would require inpatient management. If  recurrent bleeding occurs, patient may have to hospitalized  until delivery. Placenta previa is associated with increased  risk of preterm delivery and fetal growth restriction.  I also informed her that with advancing gestation, placenta  previa may resolve in some cases, and reassessment at 39  and 35 weeks is recommended.  Timing of delivery: In the absence of vaginal bleeding, we  recommend delivery at 37 weeks. As about 50% of women  with placenta previa have significant hemorrhage after 38  weeks, early term delivery is  appropriate. Although this  places the newborn at a small risk of having prematurity  complications, maternal benefit far outweighs the neonatal  risks. If the patient has vaginal bleeding at 36 weeks, delivery  at 36 weeks is appropriate. Patient is aware that cesarean  section would be performed.  As vaginal bleeding increases the likelihood of anemia, iron  supplements should be given.  Placenta previa can be associated with fetal growth  restriction and we recommend serial growth assessments.  I advised patient to abstain from sexual intercourse. ---------------------------------------------------------------------- Recommendations  -An appointment was made for her to return in 4 weeks for  completion of fetal anatomy.  -Fetal growth assessments every 4 weeks.  -Transvaginal evaluation for placental position as indicated. ----------------------------------------------------------------------                 Noralee Space, MD Electronically Signed Final Report   05/06/2022 04:40 pm ----------------------------------------------------------------------    Assessment and Plan: Patient Active Problem List   Diagnosis Date Noted   Placenta previa with hemorrhage in second trimester 05/28/2022   [redacted] weeks gestation of pregnancy 05/28/2022   Placenta previa antepartum 05/06/2022   Supervision of high risk pregnancy, antepartum 01/17/2022   History of preterm premature rupture of membranes (PPROM) 01/17/2022   Previous preterm delivery, antepartum 04/17/2020  Cervical cerclage suture present, antepartum 12/16/2019   History of cervical incompetence in pregnancy, currently pregnant 08/12/2019   Admit to Antenatal Betamethasone x 2 doses Magnesium sulfate for CP prophylaxis NICU consult, MD has spoken to neonatologist Check abruption/DIC panel IV access NPO 4-5 hours in case of need for operative delivery Will recheck presentation if she does progress in preterm labor to determine route of  delivery Routine antenatal care  Mariel Aloe, MD Faculty attending,  Center for [facility]

## 2022-05-28 NOTE — MAU Note (Signed)
...  Sally Wiley is a 36 y.o. at [redacted]w[redacted]d here in MAU reporting: She woke up this morning at 0645 and noticed her underwear were wet. She reports she then went to the restroom and noted blood when she wiped. She reports the blood was light red in color. She reports there was more blood in the toilet than on her tissue. Denies blood clots. Denies pain except for when the baby moves occasionally. Denies recent IC.   Onset of complaint: this morning Pain score: Denies pain.  FHT: 147 doppler

## 2022-05-28 NOTE — Consult Note (Signed)
Consultation Service: Neonatology   Dr. Elgie Congo has asked for consultation on MARIGENE ERLER regarding the care of a premature infant at [redacted]w[redacted]d Thank you for inviting uKoreato see this patient.   Reason for consult:  Explain the possible complications, the prognosis, and the care of a premature infant at 262and 3/7 weeks.  Chief complaint: 36y.o. female with a singleton female IUP with an estimated weight of 543 grams (last 7/10 with repeat scan scheduled for 8/7). Pregnancy has been complicated by  placenta previa with bleeding in the setting of prior cervical incompetence (preterm birth at 313 weeks s/p cerclage .  Plan is for delivery via ceasarean section/vaginal delivery if possible.  My key findings of this patient's HPI are:  I have reviewed the patient's chart and have met with her. The salient information is as follows:   [facility] and she continues to bleed during urination. Monitoring for fetal/maternal distress with no concerns at this time. On betamethasone, magnesium, and NPO.    Prenatal labs:   Prenatal care:   good Pregnancy complications:  placenta previa Maternal antibiotics: This patient's mother is not on file. Maternal Steroids: Yes Most recent dose:  8/1   My recommendations for this patient and my actions included:   1. In the presence of the SEstée Lauderand her husband (over phone), I spent 15 minutes discussing the possible complications and outcomes of prematurity at this gestational age. I discussed specific complications at this gestational age referencing the need for resuscitation at birth due to respiratory distress which may require mechanical ventilation, CPAP, and surfactant administration. In addition infant may require IV fluids pending establishment of enteral feeds (encouraged breast milk feeding), antibiotics for possible sepsis, temperature support, and continuous monitoring. I also discussed the  potential risk of complications such as intracranial hemorrhage, retinopathy, hearing deficit, and chronic lung disease. I discussed this with parents in detail and they expressed an understanding of the risks and complications of prematurity.   2. I also discussed the expected survival of an infant born at 235 weeks which is (Moderate to Good). We further discussed that (Few) of the neonates born at this age have profound or severe neurological complications and school difficulties. In addition, (Most) of the neonates born at this age will have some for of mild to moderate neurological complications. She expressed an understanding of this information.   3. I informed her that the NICU team would be present at the delivery. She agreed that all appropriate medical measures could be taken to resuscitate her infant at the delivery. She also understood that our team will always be available for any questions that come up during their infant's hospitalization and we will continue to partner with their family to support them through this difficult time. Visitation policy was discussed and all questions were addressed.   Final Impression:  36y.o. female with a 273w3dUP who is threatening to deliver and who now understands the possible complications and prognosis of her infant. The mother agrees/declines with plan for resuscitation and ICU care (history of prior 32 week infant). ShAletta Edouardibson's questions were answered. She is planning to try and provide breast milk for her infant.    ______________________________________________________________________  Thank you for asking usKoreao participate in the care of this patient. Please do not hesitate to contact usKoreagain if you are aware of any further ways we can be of assistance.  Sincerely,  Edman Circle, MD Attending Neonatologist   I spent ~35 minutes in consultation time, of which 15 minutes was spent in direct face to face  counseling.

## 2022-05-29 LAB — CULTURE, OB URINE: Culture: NO GROWTH

## 2022-05-29 LAB — GC/CHLAMYDIA PROBE AMP (~~LOC~~) NOT AT ARMC
Chlamydia: NEGATIVE
Comment: NEGATIVE
Comment: NORMAL
Neisseria Gonorrhea: NEGATIVE

## 2022-05-29 NOTE — Progress Notes (Signed)
FACULTY PRACTICE ANTEPARTUM COMPREHENSIVE PROGRESS NOTE  Sally Wiley is a 36 y.o. G3P0201 at [redacted]w[redacted]d who is admitted for bleeding in the setting of placenta previa.  Estimated Date of Delivery: 08/31/22  Length of Stay:  1 Days. Admitted 05/28/2022  Subjective: No new bleeding since admission, no other complaints, Patient reports good fetal movement.  She reports no uterine contractions, no bleeding and no loss of fluid per vagina.  Vitals:  Blood pressure 114/62, pulse 95, temperature 97.9 F (36.6 C), temperature source Oral, resp. rate 17, height 5\' 3"  (1.6 m), weight 63.6 kg, last menstrual period 10/29/2021, SpO2 100 %. Physical Examination: CONSTITUTIONAL: Well-developed, well-nourished female in no acute distress.  NEUROLOGIC: Alert and oriented to person, place, and time. No cranial nerve deficit noted. PSYCHIATRIC: Normal mood and affect. Normal behavior. Normal judgment and thought content. CARDIOVASCULAR: Normal heart rate noted, regular rhythm RESPIRATORY: Effort and breath sounds normal, no problems with respiration noted MUSCULOSKELETAL: Normal range of motion. No edema and no tenderness. 2+ distal pulses. ABDOMEN: Soft, nontender, nondistended, gravid. CERVIX: Presentation: Vertex Exam by:: Dr. [email or web address]  Fetal monitoring: FHR: 125 bpm, Variability: moderate, Accelerations: Present, Decelerations: Absent  Uterine activity: No contractions   Results for orders placed or performed during the hospital encounter of 05/28/22 (from the past 48 hour(s))  Wet prep, genital     Status: Abnormal   Collection Time: 05/28/22  8:44 AM  Result Value Ref Range   Yeast Wet Prep HPF POC PRESENT (A) NONE SEEN   Trich, Wet Prep NONE SEEN NONE SEEN   Clue Cells Wet Prep HPF POC NONE SEEN NONE SEEN   WBC, Wet Prep HPF POC <10 <10   Sperm NONE SEEN     Comment: Performed at [facility], [address], [place], [place]  CBC     Status: Abnormal   Collection Time: 05/28/22   9:04 AM  Result Value Ref Range   WBC 13.5 (H) 4.0 - 10.5 K/uL   RBC 3.72 (L) 3.87 - 5.11 MIL/uL   Hemoglobin 11.6 (L) 12.0 - 15.0 g/dL   HCT 07/28/22 (L) 76.7 - 34.1 %   MCV 92.7 80.0 - 100.0 fL   MCH 31.2 26.0 - 34.0 pg   MCHC 33.6 30.0 - 36.0 g/dL   RDW 93.7 90.2 - 40.9 %   Platelets 281 150 - 400 K/uL   nRBC 0.0 0.0 - 0.2 %    Comment: Performed at [facility], [address] [place], [place]  DIC Panel ONCE - STAT     Status: Abnormal   Collection Time: 05/28/22  9:04 AM  Result Value Ref Range   Prothrombin Time 13.5 11.4 - 15.2 seconds   INR 1.0 0.8 - 1.2    Comment: (NOTE) INR goal varies based on device and disease states.    aPTT 29 24 - 36 seconds   Fibrinogen 462 210 - 475 mg/dL    Comment: (NOTE) Fibrinogen results may be underestimated in patients receiving thrombolytic therapy.    D-Dimer, Quant 1.83 (H) 0.00 - 0.50 ug/mL-FEU    Comment: (NOTE) At the manufacturer cut-off value of 0.5 g/mL FEU, this assay has a negative predictive value of 95-100%.This assay is intended for use in conjunction with a clinical pretest probability (PTP) assessment model to exclude pulmonary embolism (PE) and deep venous thrombosis (DVT) in outpatients suspected of PE or DVT. Results should be correlated with clinical presentation.    Platelets 289 150 -  400 K/uL   Smear Review NO SCHISTOCYTES SEEN     Comment: Performed at [facility], [address], [place], [place] [address]  Type and screen [facility]     Status: None   Collection Time: 05/28/22  9:04 AM  Result Value Ref Range   ABO/RH(D) AB POS    Antibody Screen NEG    Sample Expiration      05/31/2022,2359 Performed at [facility], [address], [place], [place] [address]   RPR     Status: None   Collection Time: 05/28/22  9:04 AM  Result Value Ref Range   RPR Ser Ql NON REACTIVE NON REACTIVE    Comment: Performed at [facility], [address]  [place], [place] [address]    [place] MFM OB LIMITED  Result Date: 05/28/2022 ----------------------------------------------------------------------  OBSTETRICS REPORT                       (Signed Final 05/28/2022 05:10 pm) ---------------------------------------------------------------------- Patient Info  ID #:       [id number]                          D.O.B.:  [birth year] (35 yrs)  Name:       Sally Wiley              Visit Date: 05/28/2022 08:50 am ---------------------------------------------------------------------- Performed By  Attending:        Ma Rings MD         Ref. Address:     [address]                                                             [place], [place]                                                             [address]  Performed By:     Marcellina Millin       [facility]  Referred By:      Levie Heritage                    MD ---------------------------------------------------------------------- Orders  #  Description                           Code        Ordered By  1  [place] MFM OB LIMITED                     38101.75    JESSICA EMLY ----------------------------------------------------------------------  #  Order #                     Accession #                Episode #  1  [id number]                   [id number]                 [id number] ---------------------------------------------------------------------- Indications  Vaginal bleeding in pregnancy, second          O46.92  trimester  Placenta previa specified as without           O44.02  hemorrhage, second trimester  Cervical cerclage suture present, second       O34.32  trimester  Cervical incompetence, second trimester        O34.32  (Cerclage)  Poor obstetric history (prior pre-term labor,  O09.219  PPROM, IUFD)  [redacted] weeks gestation of pregnancy                Z3A.26 ---------------------------------------------------------------------- Fetal  Evaluation  Num Of Fetuses:         1  Fetal Heart Rate(bpm):  145  Cardiac Activity:       Observed  Presentation:           Cephalic  Placenta:               Posterior Previa  P. Cord Insertion:      Previously Visualized  Amniotic Fluid  AFI FV:      Within normal limits                              Largest Pocket(cm)                              4.3  Comment:    No placental abruption or previa identified. ---------------------------------------------------------------------- Biometry  LV:          3  mm ---------------------------------------------------------------------- OB History  Blood Type:   AB+  Gravidity:    3         Prem:   2  Living:       1 ---------------------------------------------------------------------- Gestational Age  LMP:           30w 1d        Date:  10/29/21                   EDD:   08/05/22  Best:          Altamese Cabal 3d     Det. By:  Previous Ultrasound      EDD:   08/31/22                                      (02/20/22) ---------------------------------------------------------------------- Anatomy  Heart:                 Appears normal         Kidneys:                Appear normal                         (4CH, axis, and  situs)  Diaphragm:             Appears normal         Bladder:                Appears normal  Stomach:               Appears normal, left                         sided ---------------------------------------------------------------------- Cervix Uterus Adnexa  Cervix  Length:            3.1  cm.  Normal appearance by transabdominal scan. ---------------------------------------------------------------------- Comments  This patient was admitted due to vaginal bleeding.  She has a  known placenta previa with a cervical cerclage in place.  A limited ultrasound performed today shows that the fetus is  in the vertex presentation.  There was normal amniotic fluid noted.  A posterior placenta previa continues to be noted today.  Her cervical length measured 3.1  cm long without any signs  of funneling. ----------------------------------------------------------------------                  Ma Rings, MD Electronically Signed Final Report   05/28/2022 05:10 pm ----------------------------------------------------------------------   Current scheduled medications  acetaminophen  650 mg Oral Once   betamethasone acetate-betamethasone sodium phosphate  12 mg Intramuscular Q24H   docusate sodium  100 mg Oral Daily   prenatal multivitamin  1 tablet Oral Q1200    I have reviewed the patient's current medications.  ASSESSMENT: Principal Problem:   Placenta previa with hemorrhage in second trimester Active Problems:   History of cervical incompetence in pregnancy, currently pregnant   Cervical cerclage suture present, antepartum   Previous preterm delivery, antepartum   Supervision of high risk pregnancy, antepartum   History of preterm premature rupture of membranes (PPROM)   [redacted] weeks gestation of pregnancy   PLAN: Patient to complete betamethasone series today, she is s/p magnesium sulfate for CP prophylaxis. She is s/p NICU consult, appreciate their input Patient understands she will be monitored inpatient until at least 5-7 days without bleeding, before discharge to home if considered. If bleeding worsens or has any other indication for delivery, cesarean section will be performed. Continue routine antenatal care.   Jaynie Collins, MD, FACOG Obstetrician & Gynecologist, [facility] for [employer], [facility]

## 2022-05-30 ENCOUNTER — Encounter: Payer: 59 | Admitting: Family Medicine

## 2022-05-30 DIAGNOSIS — O4412 Placenta previa with hemorrhage, second trimester: Secondary | ICD-10-CM

## 2022-05-30 DIAGNOSIS — Z3A26 26 weeks gestation of pregnancy: Secondary | ICD-10-CM | POA: Diagnosis not present

## 2022-05-30 NOTE — Progress Notes (Signed)
FACULTY PRACTICE ANTEPARTUM PROGRESS NOTE  Sally Wiley is a 36 y.o. G3P0201 at [redacted]w[redacted]d who is admitted for vaginal bleeding with known previa.  Estimated Date of Delivery: 08/31/22 Fetal presentation is cephalic.  Length of Stay:  2 Days. Admitted 05/28/2022  Subjective: Pt seen  and is doing well. Pt only notes some light brown spotting. Patient reports normal fetal movement.  She denies uterine contractions, denies bleeding and leaking of fluid per vagina.  Vitals:  Blood pressure (!) 108/59, pulse (!) 112, temperature 98.9 F (37.2 C), temperature source Oral, resp. rate 16, height 5\' 3"  (1.6 m), weight 63.6 kg, last menstrual period 10/29/2021, SpO2 98 %. Physical Examination: CONSTITUTIONAL: Well-developed, well-nourished female in no acute distress.  HENT:  Normocephalic, atraumatic, External right and left ear normal. Oropharynx is clear and moist EYES: Conjunctivae and EOM are normal.  NECK: Normal range of motion, supple, no masses. SKIN: Skin is warm and dry. No rash noted. Not diaphoretic. No erythema. No pallor. NEUROLGIC: Alert and oriented to person, place, and time. Normal reflexes, muscle tone coordination. No cranial nerve deficit noted. PSYCHIATRIC: Normal mood and affect. Normal behavior. Normal judgment and thought content. CARDIOVASCULAR: Normal heart rate noted, regular rhythm RESPIRATORY: Effort and breath sounds normal, no problems with respiration noted MUSCULOSKELETAL: Normal range of motion. No edema and no tenderness. ABDOMEN: Soft, nontender, nondistended, gravid. CERVIX: deferred  Fetal monitoring: FHR: 140s bpm, Variability: moderate, Accelerations: Present, Decelerations: Absent , category 1 strip Uterine activity: none   No results found for this or any previous visit (from the past 48 hour(s)).  I have reviewed the patient's current medications.  ASSESSMENT: Principal Problem:   Placenta previa with hemorrhage in second trimester Active  Problems:   History of cervical incompetence in pregnancy, currently pregnant   Cervical cerclage suture present, antepartum   Previous preterm delivery, antepartum   Supervision of high risk pregnancy, antepartum   History of preterm premature rupture of membranes (PPROM)   [redacted] weeks gestation of pregnancy   PLAN: No new bleeding noted Continue daily fetal testing Inpt monitoring 7 days, possible d/c Sunday or Monday   Continue routine antenatal care.   Friday, MD [facility] Faculty Attending, Center for [facility] 05/30/2022 12:49 PM

## 2022-05-31 DIAGNOSIS — O4412 Placenta previa with hemorrhage, second trimester: Secondary | ICD-10-CM | POA: Diagnosis not present

## 2022-05-31 DIAGNOSIS — Z3A26 26 weeks gestation of pregnancy: Secondary | ICD-10-CM | POA: Diagnosis not present

## 2022-05-31 LAB — CBC
HCT: 30.2 % — ABNORMAL LOW (ref 36.0–46.0)
Hemoglobin: 10.4 g/dL — ABNORMAL LOW (ref 12.0–15.0)
MCH: 32 pg (ref 26.0–34.0)
MCHC: 34.4 g/dL (ref 30.0–36.0)
MCV: 92.9 fL (ref 80.0–100.0)
Platelets: 275 10*3/uL (ref 150–400)
RBC: 3.25 MIL/uL — ABNORMAL LOW (ref 3.87–5.11)
RDW: 13.1 % (ref 11.5–15.5)
WBC: 21.8 10*3/uL — ABNORMAL HIGH (ref 4.0–10.5)
nRBC: 0.1 % (ref 0.0–0.2)

## 2022-05-31 LAB — TYPE AND SCREEN
ABO/RH(D): AB POS
Antibody Screen: NEGATIVE

## 2022-05-31 NOTE — Progress Notes (Signed)
FACULTY PRACTICE ANTEPARTUM PROGRESS NOTE  Sally Wiley is a 36 y.o. G3P0201 at [redacted]w[redacted]d who is admitted for vaginal bleeding with known previa.  Estimated Date of Delivery: 08/31/22 Fetal presentation is cephalic.  Length of Stay:  3 Days. Admitted 05/28/2022  Subjective: Pt is doing well. Pt only notes some light brown spotting but no new bleeding. Patient reports normal fetal movement.  She denies uterine contractions, denies bleeding and leaking of fluid per vagina.  Vitals:  Blood pressure 109/66, pulse 92, temperature 98 F (36.7 C), temperature source Oral, resp. rate 18, height 5\' 3"  (1.6 m), weight 63.6 kg, last menstrual period 10/29/2021, SpO2 99 %. Physical Examination: CONSTITUTIONAL: Well-developed, well-nourished female in no acute distress.  HENT:  Normocephalic, atraumatic, External right and left ear normal. Oropharynx is clear and moist EYES: Conjunctivae and EOM are normal.  NECK: Normal range of motion, supple, no masses. SKIN: Skin is warm and dry. No rash noted. Not diaphoretic. No erythema. No pallor. NEUROLGIC: Alert and oriented to person, place, and time. Normal reflexes, muscle tone coordination. No cranial nerve deficit noted. PSYCHIATRIC: Normal mood and affect. Normal behavior. Normal judgment and thought content. CARDIOVASCULAR: Normal heart rate noted, regular rhythm RESPIRATORY: Effort and breath sounds normal, no problems with respiration noted MUSCULOSKELETAL: Normal range of motion. No edema and no tenderness. ABDOMEN: Soft, nontender, nondistended, gravid. CERVIX: deferred  Fetal monitoring: FHR: 150s bpm, Variability: moderate, Accelerations: Present, Decelerations: Absent , category 1 strip Uterine activity: none   Results for orders placed or performed during the hospital encounter of 05/28/22 (from the past 48 hour(s))  Type and screen [facility]     Status: None (Preliminary result)   Collection Time: 05/31/22  6:02 AM   Result Value Ref Range   ABO/RH(D) PENDING    Antibody Screen PENDING    Sample Expiration      06/03/2022,2359 Performed at [facility] Lab, [address], [place], [place]   CBC     Status: Abnormal   Collection Time: 05/31/22  6:02 AM  Result Value Ref Range   WBC 21.8 (H) 4.0 - 10.5 K/uL   RBC 3.25 (L) 3.87 - 5.11 MIL/uL   Hemoglobin 10.4 (L) 12.0 - 15.0 g/dL   HCT 07/31/22 (L) 78.2 - 95.6 %   MCV 92.9 80.0 - 100.0 fL   MCH 32.0 26.0 - 34.0 pg   MCHC 34.4 30.0 - 36.0 g/dL   RDW 21.3 08.6 - 57.8 %   Platelets 275 150 - 400 K/uL   nRBC 0.1 0.0 - 0.2 %    Comment: Performed at [facility], [address], [place], [place]    I have reviewed the patient's current medications.  ASSESSMENT: Principal Problem:   Placenta previa with hemorrhage in second trimester Active Problems:   History of cervical incompetence in pregnancy, currently pregnant   Cervical cerclage suture present, antepartum   Previous preterm delivery, antepartum   Supervision of high risk pregnancy, antepartum   History of preterm premature rupture of membranes (PPROM)   [redacted] weeks gestation of pregnancy   PLAN: No new bleeding noted Continue daily fetal testing Inpt monitoring 7 days, possible d/c Sunday or Monday She is s/p NICU consult, BMZ and Magnesium.   Continue routine antenatal care.   Thursday, MD Attending Obstetrician & Gynecologist, [facility] for [facility], [facility]

## 2022-06-01 DIAGNOSIS — O4412 Placenta previa with hemorrhage, second trimester: Secondary | ICD-10-CM | POA: Diagnosis not present

## 2022-06-01 NOTE — Discharge Summary (Signed)
Antenatal Physician Discharge Summary  Patient ID: Sally Wiley MRN: [id number] DOB/AGE: [birth year] 36 y.o.  Admit date: 05/28/2022 Discharge date: 06/01/2022  Admission Diagnoses: vaginal bleeding in second trimester, placenta preiva, presence of cerclage  Discharge Diagnoses: vaginal bleeding in second trimester, placenta preiva, presence of cerclage   Prenatal Procedures: NST and ultrasound  Consults: Neonatology  Hospital Course:  Sally Wiley is a 36 y.o. G3P0201 with IUP at [redacted]w[redacted]d admitted for concern of ROM and vaginal bleeding in context of placenta previa.  Cerclage was previously placed in may.  The patient noted painless vaginal bleeding, but denied any contractions.  Pt was admitted and received betamethasone x 2 with magnesium sulfate for neuroprotection.  Pt also received a NICU consult.  Pt had no further bleeding or uterine activity during her admission.  She was deemed stable for discharge to home with outpatient follow up.  She has a follow up u/s scheduled for 06/03/22 and we will schedule an inpatient follow up in the offic e for next week.  Discharge Exam: Temp:  [97.7 F (36.5 C)-99.1 F (37.3 C)] 99.1 F (37.3 C) (08/04 2344) Pulse Rate:  [105-110] 105 (08/04 2344) Resp:  [16-19] 19 (08/04 2344) BP: (109-120)/(56-66) 109/56 (08/04 2344) SpO2:  [99 %-100 %] 100 % (08/04 2344) Physical Examination: CONSTITUTIONAL: Well-developed, well-nourished female in no acute distress.  HENT:  Normocephalic, atraumatic, External right and left ear normal. Oropharynx is clear and moist EYES: Conjunctivae and EOM are normal. NECK: Normal range of motion, supple, no masses SKIN: Skin is warm and dry. No rash noted. Not diaphoretic. No erythema. No pallor. NEUROLGIC: Alert and oriented to person, place, and time. Normal reflexes, muscle tone coordination. No cranial nerve deficit noted. PSYCHIATRIC: Normal mood and affect. Normal behavior. Normal judgment and thought  content. CARDIOVASCULAR: Normal heart rate noted, regular rhythm RESPIRATORY: Effort and breath sounds normal, no problems with respiration noted MUSCULOSKELETAL: Normal range of motion. No edema and no tenderness. 2+ distal pulses. ABDOMEN: Soft, nontender, nondistended, gravid. CERVIX: Presentation: Vertex Exam by:: Dr. Donavan Foil  Significant Diagnostic Studies:  Results for orders placed or performed during the hospital encounter of 05/28/22 (from the past 168 hour(s))  GC/Chlamydia probe amp ([place])not at [facility]   Collection Time: 05/28/22  8:30 AM  Result Value Ref Range   Neisseria Gonorrhea Negative    Chlamydia Negative    Comment Normal Reference Ranger Chlamydia - Negative    Comment      Normal Reference Range Neisseria Gonorrhea - Negative  Wet prep, genital   Collection Time: 05/28/22  8:44 AM  Result Value Ref Range   Yeast Wet Prep HPF POC PRESENT (A) NONE SEEN   Trich, Wet Prep NONE SEEN NONE SEEN   Clue Cells Wet Prep HPF POC NONE SEEN NONE SEEN   WBC, Wet Prep HPF POC <10 <10   Sperm NONE SEEN   Culture, OB Urine   Collection Time: 05/28/22  9:04 AM   Specimen: Urine, Random  Result Value Ref Range   Specimen Description URINE, RANDOM    Special Requests NONE    Culture      NO GROWTH Performed at [facility], [address] [place], [place] [address]    Report Status 05/29/2022 FINAL   CBC   Collection Time: 05/28/22  9:04 AM  Result Value Ref Range   WBC 13.5 (H) 4.0 - 10.5 K/uL   RBC 3.72 (L) 3.87 - 5.11 MIL/uL   Hemoglobin 11.6 (L) 12.0 -  15.0 g/dL   HCT 40.1 (L) 02.7 - 25.3 %   MCV 92.7 80.0 - 100.0 fL   MCH 31.2 26.0 - 34.0 pg   MCHC 33.6 30.0 - 36.0 g/dL   RDW 66.4 40.3 - 47.4 %   Platelets 281 150 - 400 K/uL   nRBC 0.0 0.0 - 0.2 %  DIC Panel ONCE - STAT   Collection Time: 05/28/22  9:04 AM  Result Value Ref Range   Prothrombin Time 13.5 11.4 - 15.2 seconds   INR 1.0 0.8 - 1.2   aPTT 29 24 - 36 seconds   Fibrinogen 462 210 - 475  mg/dL   D-Dimer, Quant 2.59 (H) 0.00 - 0.50 ug/mL-FEU   Platelets 289 150 - 400 K/uL   Smear Review NO SCHISTOCYTES SEEN   RPR   Collection Time: 05/28/22  9:04 AM  Result Value Ref Range   RPR Ser Ql NON REACTIVE NON REACTIVE  Type and screen [facility]   Collection Time: 05/28/22  9:04 AM  Result Value Ref Range   ABO/RH(D) AB POS    Antibody Screen NEG    Sample Expiration      05/31/2022,2359 Performed at [facility], [address], [place], [place] [address]   CBC   Collection Time: 05/31/22  6:02 AM  Result Value Ref Range   WBC 21.8 (H) 4.0 - 10.5 K/uL   RBC 3.25 (L) 3.87 - 5.11 MIL/uL   Hemoglobin 10.4 (L) 12.0 - 15.0 g/dL   HCT 56.4 (L) 33.2 - 95.1 %   MCV 92.9 80.0 - 100.0 fL   MCH 32.0 26.0 - 34.0 pg   MCHC 34.4 30.0 - 36.0 g/dL   RDW 88.4 16.6 - 06.3 %   Platelets 275 150 - 400 K/uL   nRBC 0.1 0.0 - 0.2 %  Type and screen [facility]   Collection Time: 05/31/22  6:02 AM  Result Value Ref Range   ABO/RH(D) AB POS    Antibody Screen NEG    Sample Expiration      06/03/2022,2359 Performed at [facility], [address], [place], [place] [address]    [place] MFM OB LIMITED  Result Date: 05/28/2022 ----------------------------------------------------------------------  OBSTETRICS REPORT                       (Signed Final 05/28/2022 05:10 pm) ---------------------------------------------------------------------- Patient Info  ID #:       [id number]                          D.O.B.:  1986-10-16 (36 yrs)  Name:       Sally Wiley              Visit Date: 05/28/2022 08:50 am ---------------------------------------------------------------------- Performed By  Attending:        Ma Rings MD         Ref. Address:     [address]                                                             [place], [place]  [address]  Performed By:     Marcellina Wiley       [facility]  Referred By:      Levie Heritage                    MD ---------------------------------------------------------------------- Orders  #  Description                           Code        Ordered By  1  [place] MFM OB LIMITED                     76815.01    JESSICA EMLY ----------------------------------------------------------------------  #  Order #                     Accession #                Episode #  1  [id number]                   [id number]                 [id number] ---------------------------------------------------------------------- Indications  Vaginal bleeding in pregnancy, second          O46.92  trimester  Placenta previa specified as without           O44.02  hemorrhage, second trimester  Cervical cerclage suture present, second       O34.32  trimester  Cervical incompetence, second trimester        O34.32  (Cerclage)  Poor obstetric history (prior pre-term labor,  O09.219  PPROM, IUFD)  [redacted] weeks gestation of pregnancy                Z3A.26 ---------------------------------------------------------------------- Fetal Evaluation  Num Of Fetuses:         1  Fetal Heart Rate(bpm):  145  Cardiac Activity:       Observed  Presentation:           Cephalic  Placenta:               Posterior Previa  P. Cord Insertion:      Previously Visualized  Amniotic Fluid  AFI FV:      Within normal limits                              Largest Pocket(cm)                              4.3  Comment:    No placental abruption or previa identified. ---------------------------------------------------------------------- Biometry  LV:          3  mm ---------------------------------------------------------------------- OB History  Blood Type:   AB+  Gravidity:    3         Prem:   2  Living:       1 ---------------------------------------------------------------------- Gestational Age  LMP:  30w 1d        Date:  10/29/21                    EDD:   08/05/22  Best:          Altamese Cabal 3d     Det. By:  Previous Ultrasound      EDD:   08/31/22                                      (02/20/22) ---------------------------------------------------------------------- Anatomy  Heart:                 Appears normal         Kidneys:                Appear normal                         (4CH, axis, and                         situs)  Diaphragm:             Appears normal         Bladder:                Appears normal  Stomach:               Appears normal, left                         sided ---------------------------------------------------------------------- Cervix Uterus Adnexa  Cervix  Length:            3.1  cm.  Normal appearance by transabdominal scan. ---------------------------------------------------------------------- Comments  This patient was admitted due to vaginal bleeding.  She has a  known placenta previa with a cervical cerclage in place.  A limited ultrasound performed today shows that the fetus is  in the vertex presentation.  There was normal amniotic fluid noted.  A posterior placenta previa continues to be noted today.  Her cervical length measured 3.1 cm long without any signs  of funneling. ----------------------------------------------------------------------                  Ma Rings, MD Electronically Signed Final Report   05/28/2022 05:10 pm ----------------------------------------------------------------------  [place] MFM OB DETAIL +14 WK  Result Date: 05/06/2022 ----------------------------------------------------------------------  OBSTETRICS REPORT                       (Signed Final 05/06/2022 04:40 pm) ---------------------------------------------------------------------- Patient Info  ID #:       [id number]                          D.O.B.:  20-Nov-1985 (36 yrs)  Name:       Sally Wiley              Visit Date: 05/06/2022 01:00 pm ---------------------------------------------------------------------- Performed By  Attending:        Noralee Space MD        Ref. Address:     [address]                                                             [place]                                                             [id number]  Performed By:     Anabel Halon          [facility]  Referred By:      Levie Heritage                    MD ---------------------------------------------------------------------- Orders  #  Description                           Code        Ordered By  1  [place] MFM OB DETAIL +14 WK               76811.01    Candelaria Celeste  2  [place] MFM OB TRANSVAGINAL                [address].2     JACOB STINSON ----------------------------------------------------------------------  #  Order #                     Accession #                Episode #  1  [id number]                   [id number]                 [id number]  2  [id number]                   [id number]                 [id number] ---------------------------------------------------------------------- Indications  Cervical incompetence, second trimester  O34.32  (Cerclage)  Placenta previa specified as without           O44.02  hemorrhage, second trimester  Encounter for antenatal screening for          Z36.3  malformations  Poor obstetric history (prior pre-term labor,  O09.219  PPROM, IUFD)  [redacted] weeks gestation of pregnancy                Z3A.23  LR NIPS ---------------------------------------------------------------------- Fetal Evaluation  Num Of Fetuses:         1  Fetal Heart Rate(bpm):  144  Cardiac Activity:       Observed  Presentation:           Breech  Placenta:               Posterior Previa  P. Cord Insertion:      Visualized, central  Amniotic Fluid  AFI FV:      Within normal limits                               Largest Pocket(cm)                              4.87 ---------------------------------------------------------------------- Biometry  BPD:     55.74  mm     G. Age:  23w 0d         34  %    CI:        73.28   %    70 - 86                                                          FL/HC:      19.1   %    19.2 - 20.8  HC:    206.94   mm     G. Age:  22w 6d         18  %    HC/AC:      1.14        1.05 - 1.21  AC:    181.92   mm     G. Age:  23w 0d         33  %    FL/BPD:     70.9   %    71 - 87  FL:       39.5  mm     G. Age:  22w 5d         21  %    FL/AC:      21.7   %    20 - 24  HUM:      38.5  mm     G. Age:  23w 5d         49  %  CER:      27.2  mm     G. Age:  24w 2d         92  %  LV:        4.8  mm  CM:        5.1  mm  Est. FW:     543  gm      1  lb 3 oz     24  % ---------------------------------------------------------------------- OB History  Blood Type:   AB+  Gravidity:    3         Prem:   2  Living:       1 ---------------------------------------------------------------------- Gestational Age  LMP:           27w 0d        Date:  10/29/21                   EDD:   08/05/22  U/S Today:     22w 6d                                        EDD:   09/03/22  Best:          23w 2d     Det. By:  Previous Ultrasound      EDD:   08/31/22                                      (02/20/22) ---------------------------------------------------------------------- Anatomy  Cranium:               Appears normal         LVOT:                   Appears normal  Cavum:                 Appears normal         Aortic Arch:            Appears normal  Ventricles:            Appears normal         Ductal Arch:            Not well visualized  Choroid Plexus:        Appears normal         Diaphragm:              Appears normal  Cerebellum:            Appears normal         Stomach:                Appears normal, left                                                                        sided  Posterior Fossa:        Appears normal         Abdomen:                Appears normal  Nuchal Fold:           Not applicable (>20    Abdominal Wall:         Appears nml (cord                         wks GA)  insert, abd wall)  Face:                  Orbits and profile     Cord Vessels:           Appears normal (3                         previously seen                                vessel cord)  Lips:                  Appears normal         Kidneys:                Appear normal  Palate:                Not well visualized    Bladder:                Appears normal  Thoracic:              Appears normal         Spine:                  Ltd views no                                                                        intracranial signs of                                                                        NTD  Heart:                 Appears normal         Upper Extremities:      Appears normal                         (4CH, axis, and                         situs)  RVOT:                  Appears normal         Lower Extremities:      Appears normal  Other:  Right open hand and 5th didgit visualized. VC, 3VV and 3VTV          visualized. Nasal bone visualized. Heels visualized.Fetal sex is          female. Left hand digits to be reassessed. ---------------------------------------------------------------------- Cervix Uterus Adnexa  Cervix  Length:            3.9  cm.  Normal appearance by transabdominal scan.  Uterus  No abnormality visualized.  Right Ovary  Within normal limits.  Left  Ovary  Within normal limits.  Cul De Sac  No free fluid seen.  Adnexa  No abnormality visualized. ---------------------------------------------------------------------- Impression  We performed a fetal anatomical survey.  Amniotic fluid is  normal and good fetal activity seen.  Fetal biometry is  consistent with the previously established dates.  No markers  of aneuploidies or fetal structural defects are seen.  We  performed a transvaginal ultrasound to evaluate the  cervix, cerclage and placental position.  The cervix measures  3.1 cm and the cerclage is seen close to the external os.  Placenta previa is seen and the placental edge covers  internal os and extends anteriorly.  xxxxxxxxxxxxxxxxxxxxxxxxxxxxxxxxxxxxxxxxxxxxx  Consultation (see EPIC )  I [facility] the [facility]. She is G3 P0201 at 23w 2d gestation  and is here for fetal anatomy scan.  On cell-free fetal DNA screening, the risks of fetal  aneuploidies are not increased.  MSAFP screening showed  low risk for open neural tube defects.  Patient had prophylactic cerclage in this pregnancy at [redacted]  weeks gestation.  Obstetrical history significant for a 22-week pregnancy loss in  2020 of a female infant weighing 15 ounces at birth.  Unfortunately, the infant did not survive after birth.  Baby was  delivered by breech with a head entrapment that required  Duherssen's incision and cervical repair.  In 2021, patient had a spontaneous vaginal delivery at [redacted]  weeks gestation of a female infant weighing 3 pounds and 8  ounces at birth.  Her son is in good health.  She had  prophylactic cerclage in that pregnancy.  Patient reports no chronic medical conditions including  hypertension or diabetes.  Our concerns include  History of preterm deliveries  Patient is aware that progesterone injection [facility]) has  been withdrawn. I reassured the patient of normal cervical  length measurement.  Patient understands that cerclage  does not guarantee carrying pregnancy to term.  History of  preterm deliveries the most important cause of recurrent  preterm delivery.  Given that she has normal cervical length  measurement, vaginal progesterone is not indicated.  Placenta previa  I explained the diagnosis with the help of ultrasound images. I  informed her that placenta previa is associated with recurrent  vaginal bleeding that would  require inpatient management. If  recurrent bleeding occurs, patient may have to hospitalized  until delivery. Placenta previa is associated with increased  risk of preterm delivery and fetal growth restriction.  I also informed her that with advancing gestation, placenta  previa may resolve in some cases, and reassessment at 45  and 35 weeks is recommended.  Timing of delivery: In the absence of vaginal bleeding, we  recommend delivery at 37 weeks. As about 50% of women  with placenta previa have significant hemorrhage after 38  weeks, early term delivery is appropriate. Although this  places the newborn at a small risk of having prematurity  complications, maternal benefit far outweighs the neonatal  risks. If the patient has vaginal bleeding at 36 weeks, delivery  at 36 weeks is appropriate. Patient is aware that cesarean  section would be performed.  As vaginal bleeding increases the likelihood of anemia, iron  supplements should be given.  Placenta previa can be associated with fetal growth  restriction and we recommend serial growth assessments.  I advised patient to abstain from sexual intercourse. ---------------------------------------------------------------------- Recommendations  -An appointment was made for her to return in  4 weeks for  completion of fetal anatomy.  -Fetal growth assessments every 4 weeks.  -Transvaginal evaluation for placental position as indicated. ----------------------------------------------------------------------                 Noralee Space, MD Electronically Signed Final Report   05/06/2022 04:40 pm ----------------------------------------------------------------------  [place] MFM OB Transvaginal  Result Date: 05/06/2022 ----------------------------------------------------------------------  OBSTETRICS REPORT                       (Signed Final 05/06/2022 04:40 pm) ---------------------------------------------------------------------- Patient Info  ID #:       [id number]                           D.O.B.:  February 15, 1986 (36 yrs)  Name:       Sally Wiley              Visit Date: 05/06/2022 01:00 pm ---------------------------------------------------------------------- Performed By  Attending:        Noralee Space MD        Ref. Address:     [address]                                                             [place]                                                             [address]  Performed By:     Anabel Halon          [facility]  Referred By:  Levie Heritage                    MD ---------------------------------------------------------------------- Orders  #  Description                           Code        Ordered By  1  [place] MFM OB DETAIL +14 WK               76811.01    Candelaria Celeste  2  [place] MFM OB TRANSVAGINAL                [address]     Candelaria Celeste ----------------------------------------------------------------------  #  Order #                     Accession #                Episode #  1  [id number]                   [id number]                 [id number]  2  [id number]                   [id number]                 [id number] ---------------------------------------------------------------------- Indications  Cervical incompetence, second trimester        O34.32  (Cerclage)  Placenta previa specified as without           O44.02  hemorrhage, second trimester  Encounter for antenatal screening for          Z36.3  malformations  Poor obstetric history (prior pre-term labor,  O09.219  PPROM, IUFD)  [redacted] weeks gestation of pregnancy                Z3A.23  LR NIPS ---------------------------------------------------------------------- Fetal Evaluation  Num Of Fetuses:         1   Fetal Heart Rate(bpm):  144  Cardiac Activity:       Observed  Presentation:           Breech  Placenta:               Posterior Previa  P. Cord Insertion:      Visualized, central  Amniotic Fluid  AFI FV:      Within normal limits                              Largest Pocket(cm)                              4.87 ---------------------------------------------------------------------- Biometry  BPD:     55.74  mm     G. Age:  23w 0d         34  %    CI:        73.28   %    70 - 86  FL/HC:      19.1   %    19.2 - 20.8  HC:    206.94   mm     G. Age:  22w 6d         18  %    HC/AC:      1.14        1.05 - 1.21  AC:    181.92   mm     G. Age:  23w 0d         33  %    FL/BPD:     70.9   %    71 - 87  FL:       39.5  mm     G. Age:  22w 5d         21  %    FL/AC:      21.7   %    20 - 24  HUM:      38.5  mm     G. Age:  23w 5d         49  %  CER:      27.2  mm     G. Age:  24w 2d         92  %  LV:        4.8  mm  CM:        5.1  mm  Est. FW:     543  gm      1 lb 3 oz     24  % ---------------------------------------------------------------------- OB History  Blood Type:   AB+  Gravidity:    3         Prem:   2  Living:       1 ---------------------------------------------------------------------- Gestational Age  LMP:           27w 0d        Date:  10/29/21                   EDD:   08/05/22  U/S Today:     22w 6d                                        EDD:   09/03/22  Best:          23w 2d     Det. By:  Previous Ultrasound      EDD:   08/31/22                                      (02/20/22) ---------------------------------------------------------------------- Anatomy  Cranium:               Appears normal         LVOT:                   Appears normal  Cavum:                 Appears normal         Aortic Arch:            Appears normal  Ventricles:            Appears normal         Ductal Arch:  Not well visualized  Choroid Plexus:        Appears normal          Diaphragm:              Appears normal  Cerebellum:            Appears normal         Stomach:                Appears normal, left                                                                        sided  Posterior Fossa:       Appears normal         Abdomen:                Appears normal  Nuchal Fold:           Not applicable (>20    Abdominal Wall:         Appears nml (cord                         wks GA)                                        insert, abd wall)  Face:                  Orbits and profile     Cord Vessels:           Appears normal (3                         previously seen                                vessel cord)  Lips:                  Appears normal         Kidneys:                Appear normal  Palate:                Not well visualized    Bladder:                Appears normal  Thoracic:              Appears normal         Spine:                  Ltd views no                                                                        intracranial signs of  NTD  Heart:                 Appears normal         Upper Extremities:      Appears normal                         (4CH, axis, and                         situs)  RVOT:                  Appears normal         Lower Extremities:      Appears normal  Other:  Right open hand and 5th didgit visualized. VC, 3VV and 3VTV          visualized. Nasal bone visualized. Heels visualized.Fetal sex is          female. Left hand digits to be reassessed. ---------------------------------------------------------------------- Cervix Uterus Adnexa  Cervix  Length:            3.9  cm.  Normal appearance by transabdominal scan.  Uterus  No abnormality visualized.  Right Ovary  Within normal limits.  Left Ovary  Within normal limits.  Cul De Sac  No free fluid seen.  Adnexa  No abnormality visualized. ---------------------------------------------------------------------- Impression  We performed  a fetal anatomical survey.  Amniotic fluid is  normal and good fetal activity seen.  Fetal biometry is  consistent with the previously established dates.  No markers  of aneuploidies or fetal structural defects are seen.  We performed a transvaginal ultrasound to evaluate the  cervix, cerclage and placental position.  The cervix measures  3.1 cm and the cerclage is seen close to the external os.  Placenta previa is seen and the placental edge covers  internal os and extends anteriorly.  xxxxxxxxxxxxxxxxxxxxxxxxxxxxxxxxxxxxxxxxxxxxx  Consultation (see EPIC )  I had the [facility] of [facility]. She is G3 P0201 at 23w 2d gestation  and is here for fetal anatomy scan.  On cell-free fetal DNA screening, the risks of fetal  aneuploidies are not increased.  MSAFP screening showed  low risk for open neural tube defects.  Patient had prophylactic cerclage in this pregnancy at [redacted]  weeks gestation.  Obstetrical history significant for a 22-week pregnancy loss in  2020 of a female infant weighing 15 ounces at birth.  Unfortunately, the infant did not survive after birth.  Baby was  delivered by breech with a head entrapment that required  Duherssen's incision and cervical repair.  In 2021, patient had a spontaneous vaginal delivery at [redacted]  weeks gestation of a female infant weighing 3 pounds and 8  ounces at birth.  Her son is in good health.  She had  prophylactic cerclage in that pregnancy.  Patient reports no chronic medical conditions including  hypertension or diabetes.  Our concerns include  History of preterm deliveries  Patient is aware that progesterone injection [facility]) has  been withdrawn. I reassured the patient of normal cervical  length measurement.  Patient understands that cerclage  does not guarantee carrying pregnancy to term.  History of  preterm deliveries the most important cause of recurrent  preterm delivery.  Given that she has normal cervical length   measurement, vaginal progesterone is not indicated.  Placenta previa  I explained the diagnosis with the  help of ultrasound images. I  informed her that placenta previa is associated with recurrent  vaginal bleeding that would require inpatient management. If  recurrent bleeding occurs, patient may have to hospitalized  until delivery. Placenta previa is associated with increased  risk of preterm delivery and fetal growth restriction.  I also informed her that with advancing gestation, placenta  previa may resolve in some cases, and reassessment at 25  and 35 weeks is recommended.  Timing of delivery: In the absence of vaginal bleeding, we  recommend delivery at 37 weeks. As about 50% of women  with placenta previa have significant hemorrhage after 38  weeks, early term delivery is appropriate. Although this  places the newborn at a small risk of having prematurity  complications, maternal benefit far outweighs the neonatal  risks. If the patient has vaginal bleeding at 36 weeks, delivery  at 36 weeks is appropriate. Patient is aware that cesarean  section would be performed.  As vaginal bleeding increases the likelihood of anemia, iron  supplements should be given.  Placenta previa can be associated with fetal growth  restriction and we recommend serial growth assessments.  I advised patient to abstain from sexual intercourse. ---------------------------------------------------------------------- Recommendations  -An appointment was made for her to return in 4 weeks for  completion of fetal anatomy.  -Fetal growth assessments every 4 weeks.  -Transvaginal evaluation for placental position as indicated. ----------------------------------------------------------------------                 Noralee Space, MD Electronically Signed Final Report   05/06/2022 04:40 pm ----------------------------------------------------------------------   Future Appointments  Date Time Provider Department Center  06/03/2022  3:30 PM  [facility] NURSE [facility]  06/03/2022  3:45 PM [facility] US4 [facility]  06/14/2022 10:15 AM Levie Heritage, DO [facility] None  06/27/2022 10:15 AM Levie Heritage, DO [facility] None  07/11/2022 10:15 AM Anyanwu, Jethro Bastos, MD [facility] None  07/25/2022 10:15 AM Levie Heritage, DO [facility] None    Discharge Condition: Stable  Discharge disposition: 01-Home or Self Care       Discharge Instructions     Discharge activity:  No Restrictions   Complete by: As directed    Discharge diet:  No restrictions   Complete by: As directed    Do not have sex or do anything that might make you have an orgasm   Complete by: As directed    Notify physician for a general feeling that "something is not right"   Complete by: As directed    Notify physician for increase or change in vaginal discharge   Complete by: As directed    Notify physician for intestinal cramps, with or without diarrhea, sometimes described as "gas pain"   Complete by: As directed    Notify physician for leaking of fluid   Complete by: As directed    Notify physician for low, dull backache, unrelieved by heat or Tylenol   Complete by: As directed    Notify physician for menstrual like cramps   Complete by: As directed    Notify physician for pelvic pressure   Complete by: As directed    Notify physician for uterine contractions.  These may be painless and feel like the uterus is tightening or the baby is  "balling up"   Complete by: As directed    Notify physician for vaginal bleeding   Complete by: As directed    PRETERM LABOR:  Includes any of the follwing symptoms that occur between  20 - [redacted] weeks gestation.  If these symptoms are not stopped, preterm labor can result in preterm delivery, placing your baby at risk   Complete by: As directed       Allergies as of 06/01/2022       Reactions   Shellfish Allergy Hives        Medication List     STOP taking these medications    traMADol 50 MG tablet Commonly  known as: ULTRAM       TAKE these medications    acetaminophen 325 MG tablet Commonly known as: TYLENOL Take 325 mg by mouth every 6 (six) hours as needed for moderate pain.   multivitamin-prenatal 27-0.8 MG Tabs tablet Take 1 tablet by mouth daily.        Follow-up Information     [facility] Schedule an appointment as soon as possible for a visit on 06/05/2022.   Specialty: Obstetrics and Gynecology Why: hospital follow up Contact information: [address] [place] [address] [phone]                Total discharge time: 20 minutes   Signed: Warden Fillers M.D. 06/01/2022, 9:32 AM

## 2022-06-03 ENCOUNTER — Encounter: Payer: Self-pay | Admitting: *Deleted

## 2022-06-03 ENCOUNTER — Other Ambulatory Visit: Payer: Self-pay | Admitting: Obstetrics and Gynecology

## 2022-06-03 ENCOUNTER — Other Ambulatory Visit: Payer: Self-pay | Admitting: *Deleted

## 2022-06-03 ENCOUNTER — Ambulatory Visit (HOSPITAL_BASED_OUTPATIENT_CLINIC_OR_DEPARTMENT_OTHER): Payer: 59

## 2022-06-03 ENCOUNTER — Ambulatory Visit: Payer: 59 | Attending: Obstetrics and Gynecology | Admitting: *Deleted

## 2022-06-03 VITALS — BP 119/68 | HR 107

## 2022-06-03 DIAGNOSIS — O3432 Maternal care for cervical incompetence, second trimester: Secondary | ICD-10-CM

## 2022-06-03 DIAGNOSIS — O4412 Placenta previa with hemorrhage, second trimester: Secondary | ICD-10-CM | POA: Diagnosis not present

## 2022-06-03 DIAGNOSIS — O4692 Antepartum hemorrhage, unspecified, second trimester: Secondary | ICD-10-CM | POA: Diagnosis not present

## 2022-06-03 DIAGNOSIS — Z3A23 23 weeks gestation of pregnancy: Secondary | ICD-10-CM | POA: Insufficient documentation

## 2022-06-03 DIAGNOSIS — O4402 Placenta previa specified as without hemorrhage, second trimester: Secondary | ICD-10-CM

## 2022-06-03 DIAGNOSIS — O4403 Placenta previa specified as without hemorrhage, third trimester: Secondary | ICD-10-CM

## 2022-06-03 DIAGNOSIS — O09212 Supervision of pregnancy with history of pre-term labor, second trimester: Secondary | ICD-10-CM

## 2022-06-03 DIAGNOSIS — Z3A27 27 weeks gestation of pregnancy: Secondary | ICD-10-CM

## 2022-06-03 DIAGNOSIS — O0990 Supervision of high risk pregnancy, unspecified, unspecified trimester: Secondary | ICD-10-CM

## 2022-06-03 DIAGNOSIS — O4400 Placenta previa specified as without hemorrhage, unspecified trimester: Secondary | ICD-10-CM | POA: Insufficient documentation

## 2022-06-03 DIAGNOSIS — O09893 Supervision of other high risk pregnancies, third trimester: Secondary | ICD-10-CM

## 2022-06-03 DIAGNOSIS — O0992 Supervision of high risk pregnancy, unspecified, second trimester: Secondary | ICD-10-CM | POA: Diagnosis present

## 2022-06-03 DIAGNOSIS — Z8759 Personal history of other complications of pregnancy, childbirth and the puerperium: Secondary | ICD-10-CM

## 2022-06-03 DIAGNOSIS — O3433 Maternal care for cervical incompetence, third trimester: Secondary | ICD-10-CM

## 2022-06-05 ENCOUNTER — Encounter: Payer: 59 | Admitting: Family Medicine

## 2022-06-14 ENCOUNTER — Encounter: Payer: Self-pay | Admitting: General Practice

## 2022-06-14 ENCOUNTER — Ambulatory Visit (INDEPENDENT_AMBULATORY_CARE_PROVIDER_SITE_OTHER): Payer: 59 | Admitting: Family Medicine

## 2022-06-14 VITALS — BP 121/74 | HR 102

## 2022-06-14 DIAGNOSIS — O4400 Placenta previa specified as without hemorrhage, unspecified trimester: Secondary | ICD-10-CM

## 2022-06-14 DIAGNOSIS — O3430 Maternal care for cervical incompetence, unspecified trimester: Secondary | ICD-10-CM

## 2022-06-14 DIAGNOSIS — O0990 Supervision of high risk pregnancy, unspecified, unspecified trimester: Secondary | ICD-10-CM

## 2022-06-14 DIAGNOSIS — O09299 Supervision of pregnancy with other poor reproductive or obstetric history, unspecified trimester: Secondary | ICD-10-CM

## 2022-06-14 DIAGNOSIS — Z3A28 28 weeks gestation of pregnancy: Secondary | ICD-10-CM

## 2022-06-14 NOTE — Progress Notes (Signed)
   PRENATAL VISIT NOTE  Subjective:  Sally Wiley is a 36 y.o. G3P0201 at [redacted]w[redacted]d being seen today for ongoing prenatal care.  She is currently monitored for the following issues for this high-risk pregnancy and has History of cervical incompetence in pregnancy, currently pregnant; Cervical cerclage suture present, antepartum; Previous preterm delivery, antepartum; Supervision of high risk pregnancy, antepartum; History of preterm premature rupture of membranes (PPROM); Placenta previa antepartum; Placenta previa with hemorrhage in second trimester; and [redacted] weeks gestation of pregnancy on their problem list.  Patient reports no complaints.  The patient was hospitalized from 8/1 until 8/5 for an episode of vaginal bleeding.  She is no longer bleeding.  No cramping.  Occasional Deberah Pelton. Contractions: Not present. Vag. Bleeding: None.  Movement: Present. Denies leaking of fluid.   The following portions of the patient's history were reviewed and updated as appropriate: allergies, current medications, past family history, past medical history, past social history, past surgical history and problem list.   Objective:   Vitals:   06/14/22 0951  BP: 121/74  Pulse: (!) 102    Fetal Status:     Movement: Present     General:  Alert, oriented and cooperative. Patient is in no acute distress.  Skin: Skin is warm and dry. No rash noted.   Cardiovascular: Normal heart rate noted  Respiratory: Normal respiratory effort, no problems with respiration noted  Abdomen: Soft, gravid, appropriate for gestational age.  Pain/Pressure: Absent     Pelvic: Cervical exam deferred        Extremities: Normal range of motion.  Edema: None  Mental Status: Normal mood and affect. Normal behavior. Normal judgment and thought content.   Assessment and Plan:  Pregnancy: G3P0201 at [redacted]w[redacted]d 1. [redacted] weeks gestation of pregnancy - Glucose tolerance, 1 hour - RPR - HIV antibody (with reflex) - CBC  2. Supervision  of high risk pregnancy, antepartum FHT and FH normal - Glucose tolerance, 1 hour - RPR - HIV antibody (with reflex) - CBC  3. History of cervical incompetence in pregnancy, currently pregnant 4. Cervical cerclage suture present, antepartum  5. Placenta previa antepartum Will continue with [place]. Delivery no later than 37 weeks. Sooner if having recurrent bleeding.  Preterm labor symptoms and general obstetric precautions including but not limited to vaginal bleeding, contractions, leaking of fluid and fetal movement were reviewed in detail with the patient. Please refer to After Visit Summary for other counseling recommendations.   No follow-ups on file.  Future Appointments  Date Time Provider Department Center  06/27/2022 10:15 AM Levie Heritage, DO [facility] None  07/05/2022  1:30 PM [facility] NURSE [facility]  07/05/2022  1:45 PM [facility] US6 [facility]  07/11/2022 10:15 AM Anyanwu, Jethro Bastos, MD [facility] None  07/25/2022 10:15 AM Levie Heritage, DO [facility] None  08/08/2022 10:35 AM Levie Heritage, DO [facility] None  08/15/2022 10:35 AM Levie Heritage, DO [facility] None  08/22/2022 10:35 AM Levie Heritage, DO [facility] None  08/29/2022  9:15 AM Levie Heritage, DO [facility] None    Levie Heritage, DO

## 2022-06-14 NOTE — Progress Notes (Signed)
Patient was hospitalized 05/28/2022. Sally Wiley [facility]

## 2022-06-15 LAB — HIV ANTIBODY (ROUTINE TESTING W REFLEX): HIV Screen 4th Generation wRfx: NONREACTIVE

## 2022-06-15 LAB — CBC
Hematocrit: 31.9 % — ABNORMAL LOW (ref 34.0–46.6)
Hemoglobin: 10.8 g/dL — ABNORMAL LOW (ref 11.1–15.9)
MCH: 30.7 pg (ref 26.6–33.0)
MCHC: 33.9 g/dL (ref 31.5–35.7)
MCV: 91 fL (ref 79–97)
Platelets: 250 10*3/uL (ref 150–450)
RBC: 3.52 x10E6/uL — ABNORMAL LOW (ref 3.77–5.28)
RDW: 12.4 % (ref 11.7–15.4)
WBC: 9.2 10*3/uL (ref 3.4–10.8)

## 2022-06-15 LAB — RPR: RPR Ser Ql: NONREACTIVE

## 2022-06-15 LAB — GLUCOSE TOLERANCE, 1 HOUR: Glucose, 1Hr PP: 171 mg/dL (ref 70–199)

## 2022-06-18 ENCOUNTER — Telehealth: Payer: Self-pay

## 2022-06-18 NOTE — Telephone Encounter (Signed)
-----   Message from Levie Heritage, DO sent at 06/17/2022  9:39 AM EDT ----- Needs to come in for 3hr GTT

## 2022-06-18 NOTE — Telephone Encounter (Signed)
Called patient to inform her that her 1 hr GTT was elevated and she will need to do a 2 hr GTT. Patient states she will do the 2 hr GTT at her appt on 06/27/22. Understanding was voiced. Seab Axel l Janaria Mccammon, CMA

## 2022-06-27 ENCOUNTER — Ambulatory Visit (INDEPENDENT_AMBULATORY_CARE_PROVIDER_SITE_OTHER): Payer: 59 | Admitting: Family Medicine

## 2022-06-27 ENCOUNTER — Encounter: Payer: Self-pay | Admitting: General Practice

## 2022-06-27 VITALS — BP 112/68 | HR 101 | Wt 143.0 lb

## 2022-06-27 DIAGNOSIS — O09293 Supervision of pregnancy with other poor reproductive or obstetric history, third trimester: Secondary | ICD-10-CM | POA: Diagnosis not present

## 2022-06-27 DIAGNOSIS — O0993 Supervision of high risk pregnancy, unspecified, third trimester: Secondary | ICD-10-CM | POA: Diagnosis not present

## 2022-06-27 DIAGNOSIS — O4413 Placenta previa with hemorrhage, third trimester: Secondary | ICD-10-CM

## 2022-06-27 DIAGNOSIS — Z3A30 30 weeks gestation of pregnancy: Secondary | ICD-10-CM

## 2022-06-27 DIAGNOSIS — Z8759 Personal history of other complications of pregnancy, childbirth and the puerperium: Secondary | ICD-10-CM | POA: Diagnosis not present

## 2022-06-27 DIAGNOSIS — O3430 Maternal care for cervical incompetence, unspecified trimester: Secondary | ICD-10-CM

## 2022-06-27 DIAGNOSIS — O09299 Supervision of pregnancy with other poor reproductive or obstetric history, unspecified trimester: Secondary | ICD-10-CM

## 2022-06-27 DIAGNOSIS — O24419 Gestational diabetes mellitus in pregnancy, unspecified control: Secondary | ICD-10-CM

## 2022-06-27 DIAGNOSIS — O4412 Placenta previa with hemorrhage, second trimester: Secondary | ICD-10-CM

## 2022-06-27 DIAGNOSIS — O3433 Maternal care for cervical incompetence, third trimester: Secondary | ICD-10-CM

## 2022-06-27 DIAGNOSIS — O0990 Supervision of high risk pregnancy, unspecified, unspecified trimester: Secondary | ICD-10-CM

## 2022-06-27 NOTE — Progress Notes (Signed)
   PRENATAL VISIT NOTE  Subjective:  Sally Wiley is a 36 y.o. G3P0201 at [redacted]w[redacted]d being seen today for ongoing prenatal care.  She is currently monitored for the following issues for this high-risk pregnancy and has History of cervical incompetence in pregnancy, currently pregnant; Cervical cerclage suture present, antepartum; Previous preterm delivery, antepartum; Supervision of high risk pregnancy, antepartum; History of preterm premature rupture of membranes (PPROM); Placenta previa antepartum; Placenta previa with hemorrhage in second trimester; and [redacted] weeks gestation of pregnancy on their problem list.  Patient reports no complaints.  Contractions: Irritability. Vag. Bleeding: None.  Movement: Present. Denies leaking of fluid.   The following portions of the patient's history were reviewed and updated as appropriate: allergies, current medications, past family history, past medical history, past social history, past surgical history and problem list.   Objective:   Vitals:   06/27/22 0835  BP: 112/68  Pulse: (!) 101  Weight: 143 lb (64.9 kg)    Fetal Status:     Movement: Present     General:  Alert, oriented and cooperative. Patient is in no acute distress.  Skin: Skin is warm and dry. No rash noted.   Cardiovascular: Normal heart rate noted  Respiratory: Normal respiratory effort, no problems with respiration noted  Abdomen: Soft, gravid, appropriate for gestational age.  Pain/Pressure: Present     Pelvic: Cervical exam deferred        Extremities: Normal range of motion.  Edema: None  Mental Status: Normal mood and affect. Normal behavior. Normal judgment and thought content.   Assessment and Plan:  Pregnancy: G3P0201 at [redacted]w[redacted]d 1. [redacted] weeks gestation of pregnancy - Glucose Tolerance, 2 Hours w/1 Hour  2. Supervision of high risk pregnancy, antepartum FHT and FH normal  3. Placenta previa with hemorrhage in second trimester No bleeding currently. Last [place] still shows  placenta previa. Will schedule for C/s with BTL.  4. History of preterm premature rupture of membranes (PPROM) No leaking fluid  5. History of cervical incompetence in pregnancy, currently pregnant Cerclage in place  6. Cervical cerclage suture present, antepartum  Preterm labor symptoms and general obstetric precautions including but not limited to vaginal bleeding, contractions, leaking of fluid and fetal movement were reviewed in detail with the patient. Please refer to After Visit Summary for other counseling recommendations.   No follow-ups on file.  Future Appointments  Date Time Provider Department Center  07/05/2022  1:30 PM [facility] NURSE [facility]  07/05/2022  1:45 PM [facility] US6 [facility]  07/11/2022 10:15 AM Anyanwu, Jethro Bastos, MD [facility] None  07/25/2022 10:15 AM Levie Heritage, DO [facility] None  08/08/2022 10:35 AM Levie Heritage, DO [facility] None  08/15/2022 10:35 AM Levie Heritage, DO [facility] None  08/22/2022 10:35 AM Levie Heritage, DO [facility] None  08/29/2022  9:15 AM Levie Heritage, DO [facility] None    Levie Heritage, DO

## 2022-06-28 LAB — GLUCOSE TOLERANCE, 2 HOURS W/ 1HR
Glucose, 1 hour: 203 mg/dL — ABNORMAL HIGH (ref 70–179)
Glucose, 2 hour: 175 mg/dL — ABNORMAL HIGH (ref 70–152)
Glucose, Fasting: 68 mg/dL — ABNORMAL LOW (ref 70–91)

## 2022-07-02 DIAGNOSIS — O24419 Gestational diabetes mellitus in pregnancy, unspecified control: Secondary | ICD-10-CM | POA: Insufficient documentation

## 2022-07-02 MED ORDER — ACCU-CHEK GUIDE VI STRP: ORAL_STRIP | 12 refills | Status: DC

## 2022-07-02 MED ORDER — ACCU-CHEK GUIDE W/DEVICE KIT: 1.0000 | PACK | Freq: Four times a day (QID) | 0 refills | Status: DC

## 2022-07-02 MED ORDER — ACCU-CHEK SOFTCLIX LANCETS MISC: 1.0000 | Freq: Four times a day (QID) | 12 refills | Status: DC

## 2022-07-02 NOTE — Addendum Note (Signed)
Addended by: Levie Heritage on: 07/02/2022 04:51 PM   Modules accepted: Orders

## 2022-07-03 ENCOUNTER — Other Ambulatory Visit: Payer: Self-pay

## 2022-07-03 DIAGNOSIS — O99810 Abnormal glucose complicating pregnancy: Secondary | ICD-10-CM

## 2022-07-03 NOTE — Progress Notes (Signed)
Patient notified of 2 hr gtt abnormal test. Patient made aware that she is being sent to diabetes management. Armandina Stammer  RN

## 2022-07-05 ENCOUNTER — Ambulatory Visit: Payer: Medicaid Other | Admitting: *Deleted

## 2022-07-05 ENCOUNTER — Ambulatory Visit: Payer: Medicaid Other | Attending: Maternal & Fetal Medicine

## 2022-07-05 ENCOUNTER — Other Ambulatory Visit: Payer: Self-pay | Admitting: Maternal & Fetal Medicine

## 2022-07-05 ENCOUNTER — Other Ambulatory Visit: Payer: Self-pay | Admitting: *Deleted

## 2022-07-05 VITALS — BP 112/66 | HR 113

## 2022-07-05 DIAGNOSIS — Z8759 Personal history of other complications of pregnancy, childbirth and the puerperium: Secondary | ICD-10-CM

## 2022-07-05 DIAGNOSIS — O365931 Maternal care for other known or suspected poor fetal growth, third trimester, fetus 1: Secondary | ICD-10-CM

## 2022-07-05 DIAGNOSIS — O4403 Placenta previa specified as without hemorrhage, third trimester: Secondary | ICD-10-CM

## 2022-07-05 DIAGNOSIS — O0990 Supervision of high risk pregnancy, unspecified, unspecified trimester: Secondary | ICD-10-CM | POA: Diagnosis not present

## 2022-07-05 DIAGNOSIS — O09893 Supervision of other high risk pregnancies, third trimester: Secondary | ICD-10-CM | POA: Diagnosis not present

## 2022-07-05 DIAGNOSIS — O24410 Gestational diabetes mellitus in pregnancy, diet controlled: Secondary | ICD-10-CM

## 2022-07-05 DIAGNOSIS — O3433 Maternal care for cervical incompetence, third trimester: Secondary | ICD-10-CM | POA: Insufficient documentation

## 2022-07-05 DIAGNOSIS — O365930 Maternal care for other known or suspected poor fetal growth, third trimester, not applicable or unspecified: Secondary | ICD-10-CM | POA: Diagnosis not present

## 2022-07-10 ENCOUNTER — Ambulatory Visit: Payer: 59 | Admitting: Registered"

## 2022-07-11 ENCOUNTER — Ambulatory Visit (INDEPENDENT_AMBULATORY_CARE_PROVIDER_SITE_OTHER): Payer: Medicaid Other | Admitting: Obstetrics & Gynecology

## 2022-07-11 ENCOUNTER — Encounter: Payer: Self-pay | Admitting: Obstetrics & Gynecology

## 2022-07-11 VITALS — BP 115/70 | HR 99 | Wt 146.0 lb

## 2022-07-11 DIAGNOSIS — O24419 Gestational diabetes mellitus in pregnancy, unspecified control: Secondary | ICD-10-CM

## 2022-07-11 DIAGNOSIS — O3433 Maternal care for cervical incompetence, third trimester: Secondary | ICD-10-CM

## 2022-07-11 DIAGNOSIS — Z9851 Tubal ligation status: Secondary | ICD-10-CM | POA: Insufficient documentation

## 2022-07-11 DIAGNOSIS — O365930 Maternal care for other known or suspected poor fetal growth, third trimester, not applicable or unspecified: Secondary | ICD-10-CM

## 2022-07-11 DIAGNOSIS — O09213 Supervision of pregnancy with history of pre-term labor, third trimester: Secondary | ICD-10-CM

## 2022-07-11 DIAGNOSIS — O99013 Anemia complicating pregnancy, third trimester: Secondary | ICD-10-CM

## 2022-07-11 DIAGNOSIS — O0993 Supervision of high risk pregnancy, unspecified, third trimester: Secondary | ICD-10-CM

## 2022-07-11 DIAGNOSIS — Z3A32 32 weeks gestation of pregnancy: Secondary | ICD-10-CM

## 2022-07-11 DIAGNOSIS — O4400 Placenta previa specified as without hemorrhage, unspecified trimester: Secondary | ICD-10-CM | POA: Diagnosis not present

## 2022-07-11 DIAGNOSIS — O09219 Supervision of pregnancy with history of pre-term labor, unspecified trimester: Secondary | ICD-10-CM

## 2022-07-11 DIAGNOSIS — Z302 Encounter for sterilization: Secondary | ICD-10-CM | POA: Insufficient documentation

## 2022-07-11 DIAGNOSIS — O3430 Maternal care for cervical incompetence, unspecified trimester: Secondary | ICD-10-CM

## 2022-07-11 DIAGNOSIS — O0990 Supervision of high risk pregnancy, unspecified, unspecified trimester: Secondary | ICD-10-CM

## 2022-07-11 LAB — POCT URINALYSIS DIPSTICK OB
Bilirubin, UA: NEGATIVE
Blood, UA: POSITIVE
Glucose, UA: NEGATIVE
Ketones, UA: NEGATIVE
Nitrite, UA: NEGATIVE
Spec Grav, UA: 1.025 (ref 1.010–1.025)
Urobilinogen, UA: 0.2 E.U./dL
pH, UA: 5 (ref 5.0–8.0)

## 2022-07-11 LAB — GLUCOSE, POCT (MANUAL RESULT ENTRY): POC Glucose: 81 mg/dl (ref 70–99)

## 2022-07-11 NOTE — Addendum Note (Signed)
Addended by: Lorelle Gibbs L on: 07/11/2022 11:29 AM   Modules accepted: Orders

## 2022-07-11 NOTE — Progress Notes (Signed)
PRENATAL VISIT NOTE  Subjective:  Sally Wiley is a 36 y.o. G3P0201 at [redacted]w[redacted]d being seen today for ongoing prenatal care.  She is currently monitored for the following issues for this high-risk pregnancy and has History of cervical incompetence in pregnancy, currently pregnant; Cervical cerclage suture present, antepartum; Previous preterm delivery, antepartum; Supervision of high risk pregnancy, antepartum; History of preterm premature rupture of membranes (PPROM); Placenta previa antepartum (posterior); Placenta previa with hemorrhage in second trimester; Gestational diabetes mellitus (GDM) in third trimester; IUGR (intrauterine growth restriction) affecting care of mother, third trimester; and Request for sterilization on their problem list.  Patient reports no complaints.  Contractions: Irritability. Vag. Bleeding: None.  Movement: Present. Denies leaking of fluid.   The following portions of the patient's history were reviewed and updated as appropriate: allergies, current medications, past family history, past medical history, past social history, past surgical history and problem list.   Objective:   Vitals:   07/11/22 1005  BP: 115/70  Pulse: 99  Weight: 146 lb (66.2 kg)    Fetal Status: Fetal Heart Rate (bpm): 131   Movement: Present     General:  Alert, oriented and cooperative. Patient is in no acute distress.  Skin: Skin is warm and dry. No rash noted.   Cardiovascular: Normal heart rate noted  Respiratory: Normal respiratory effort, no problems with respiration noted  Abdomen: Soft, gravid, appropriate for gestational age.  Pain/Pressure: Present     Pelvic: Cervical exam deferred        Extremities: Normal range of motion.  Edema: None  Mental Status: Normal mood and affect. Normal behavior. Normal judgment and thought content.   [place] MFM OB FOLLOW UP  Result Date: 07/05/2022 ----------------------------------------------------------------------  OBSTETRICS REPORT                        (Signed Final 07/05/2022 03:17 pm) ---------------------------------------------------------------------- Patient Info  ID #:       [id number]                          D.O.B.:  30-Jun-1986 (35 yrs)  Name:       Sally Wiley              Visit Date: 07/05/2022 02:02 pm ---------------------------------------------------------------------- Performed By  Attending:        Johnell Comings MD         Ref. Address:     [address]                                                             [place], [place]  Performed By:     Rolm Bookbinder RDMS     Location:         [facility]                                                             [place] [facility]                                                             [facility]  Referred By:      Truett Mainland                    MD ---------------------------------------------------------------------- Orders  #  Description                           Code        Ordered By  1  [place] MFM OB FOLLOW UP                   [id number]    Sander Nephew  2  [place] MFM UA CORD DOPPLER                76820.02    Sander Nephew  3  [place] MFM FETAL BPP WO NON               76819.01    CORENTHIAN     STRESS                                            BOOKER ----------------------------------------------------------------------  #  Order #                     Accession #                Episode #  1  [id number]                   [id number]                 [id number]  2  [id number]                   [id number]                 [id number]  3  [id number]                   [id number]  [id number] ---------------------------------------------------------------------- Indications  Maternal care for known or suspected poor      O36.5930   fetal growth, third trimester, not applicable or  unspecified IUGR  Placenta previa specified as without           O44.03  hemorrhage, third trimester  Vaginal bleeding in pregnancy, third trimester O46.93  Cervical cerclage suture present, third        O34.33  trimester  [redacted] weeks gestation of pregnancy                Z3A.31  Cervical incompetence, third trimester         O34.33  Poor obstetric history (prior pre-term labor,  O09.219  PPROM, IUFD) ---------------------------------------------------------------------- Fetal Evaluation  Num Of Fetuses:         1  Fetal Heart Rate(bpm):  125  Cardiac Activity:       Observed  Presentation:           Cephalic  Placenta:               Posterior Previa  P. Cord Insertion:      Previously Visualized  Amniotic Fluid  AFI FV:      Subjectively low-normal  AFI Sum(cm)     %Tile       Largest Pocket(cm)  7.53            < 3         3.31  RUQ(cm)       RLQ(cm)       LUQ(cm)        LLQ(cm)  0             1.66          3.31           2.56 ---------------------------------------------------------------------- Biophysical Evaluation  Amniotic F.V:   Within normal limits       F. Tone:        Observed  F. Movement:    Observed                   Score:          8/8  F. Breathing:   Observed ---------------------------------------------------------------------- Biometry  BPD:     72.78  mm     G. Age:  29w 1d        < 1  %    CI:        68.39   %    70 - 86                                                          FL/HC:      20.3   %    19.1 - 21.3  HC:    281.35   mm     G. Age:  30w 6d        3.2  %    HC/AC:      1.06        0.96 - 1.17  AC:    266.14   mm     G. Age:  30w 5d         18  %    FL/BPD:     78.5   %    71 - 87  FL:  57.13  mm     G. Age:  30w 0d        3.9  %    FL/AC:      21.5   %    20 - 24  Est. FW:    1561  gm      3 lb 7 oz      7  % ---------------------------------------------------------------------- OB History  Blood Type:   AB+  Gravidity:    3          Prem:   2  Living:       1 ---------------------------------------------------------------------- Gestational Age  LMP:           35w 4d        Date:  10/29/21                   EDD:   08/05/22  U/S Today:     30w 1d                                        EDD:   09/12/22  Best:          31w 6d     Det. By:  Previous Ultrasound      EDD:   08/31/22                                      (02/20/22) ---------------------------------------------------------------------- Doppler - Fetal Vessels  Umbilical Artery   S/D     %tile      RI    %tile      PI    %tile     PSV    ADFV    RDFV                                                     (cm/s)   2.58       43    0.61       49     0.9       49    46.17      No      No ---------------------------------------------------------------------- Comments  This patient was seen due to placenta previa noted on her  prior exams.  She denies any problems since her last exam  and reports feeling vigorous fetal movements throughout the  day.  On today's exam, the EFW (3 pounds 7 ounces) measures at  the 7th percentile for her gestational age indicating IUGR.  The total AFI was 7.53 cm (lower normal range).  A BPP performed today was 8 out of 8.  Doppler studies of the umbilical arteries showed a normal  S/D ratio of 2.58 . There were no signs of absent or reversed  end-diastolic flow.  A placenta previa continues to be noted on today's exam.  The patient was advised that hopefully, the placenta will  move away from the cervix later in her pregnancy.  We will perform a transvaginal ultrasound in 4 weeks to  assess the placental location.  Due to fetal growth restriction, we will continue to follow her  with weekly fetal testing and umbilical artery Doppler studies.  She will return in 1 week for another BPP and umbilical artery  Doppler study. ----------------------------------------------------------------------                  Johnell Comings, MD Electronically Signed Final Report   07/05/2022  03:17 pm ----------------------------------------------------------------------  [place] MFM UA CORD DOPPLER  Result Date: 07/05/2022 ----------------------------------------------------------------------  OBSTETRICS REPORT                       (Signed Final 07/05/2022 03:17 pm) ---------------------------------------------------------------------- Patient Info  ID #:       [id number]                          D.O.B.:  July 18, 1986 (35 yrs)  Name:       Sally Wiley              Visit Date: 07/05/2022 02:02 pm ---------------------------------------------------------------------- Performed By  Attending:        Johnell Comings MD         Ref. Address:     [address]                                                             [place], [place]  Performed By:     Rolm Bookbinder RDMS     [facility]         [facility]                                                             [place] for                                                             Women  Referred By:      Truett Mainland                    MD ---------------------------------------------------------------------- Orders  #  Description                           Code        Ordered By  1  [place] MFM OB FOLLOW UP                   76816.01    Lin Landsman  2  [place] MFM UA CORD DOPPLER                36144.31    Lin Landsman  3  [place] MFM FETAL BPP WO NON               76819.01    CORENTHIAN     STRESS                                            BOOKER ----------------------------------------------------------------------  #  Order #                     Accession #                Episode #  1  [id number]                   [id number]                 [id number]  2  [id number]                   [id number]                  [id number]  3  [id number]                   [id number]                 [id number] ---------------------------------------------------------------------- Indications  Maternal care for known or suspected poor      O36.5930  fetal growth, third trimester, not applicable or  unspecified IUGR  Placenta previa specified as without           O44.03  hemorrhage, third trimester  Vaginal bleeding in pregnancy, third trimester O46.93  Cervical cerclage suture present, third        O34.33  trimester  [redacted] weeks gestation of pregnancy                Z3A.31  Cervical incompetence, third trimester         O34.33  Poor obstetric history (prior pre-term labor,  O09.219  PPROM, IUFD) ---------------------------------------------------------------------- Fetal Evaluation  Num Of Fetuses:         1  Fetal Heart Rate(bpm):  125  Cardiac Activity:       Observed  Presentation:           Cephalic  Placenta:               Posterior Previa  P. Cord Insertion:  Previously Visualized  Amniotic Fluid  AFI FV:      Subjectively low-normal  AFI Sum(cm)     %Tile       Largest Pocket(cm)  7.53            < 3         3.31  RUQ(cm)       RLQ(cm)       LUQ(cm)        LLQ(cm)  0             1.66          3.31           2.56 ---------------------------------------------------------------------- Biophysical Evaluation  Amniotic F.V:   Within normal limits       F. Tone:        Observed  F. Movement:    Observed                   Score:          8/8  F. Breathing:   Observed ---------------------------------------------------------------------- Biometry  BPD:     72.78  mm     G. Age:  29w 1d        < 1  %    CI:        68.39   %    70 - 86                                                          FL/HC:      20.3   %    19.1 - 21.3  HC:    281.35   mm     G. Age:  30w 6d        3.2  %    HC/AC:      1.06        0.96 - 1.17  AC:    266.14   mm     G. Age:  30w 5d         18  %    FL/BPD:     78.5   %    71 - 87  FL:      57.13  mm     G. Age:  30w 0d         3.9  %    FL/AC:      21.5   %    20 - 24  Est. FW:    1561  gm      3 lb 7 oz      7  % ---------------------------------------------------------------------- OB History  Blood Type:   AB+  Gravidity:    3         Prem:   2  Living:       1 ---------------------------------------------------------------------- Gestational Age  LMP:           35w 4d        Date:  10/29/21                   EDD:   08/05/22  U/S Today:     30w 1d  EDD:   09/12/22  Best:          31w 6d     Det. By:  Previous Ultrasound      EDD:   08/31/22                                      (02/20/22) ---------------------------------------------------------------------- Doppler - Fetal Vessels  Umbilical Artery   S/D     %tile      RI    %tile      PI    %tile     PSV    ADFV    RDFV                                                     (cm/s)   2.58       43    0.61       49     0.9       49    46.17      No      No ---------------------------------------------------------------------- Comments  This patient was seen due to placenta previa noted on her  prior exams.  She denies any problems since her last exam  and reports feeling vigorous fetal movements throughout the  day.  On today's exam, the EFW (3 pounds 7 ounces) measures at  the 7th percentile for her gestational age indicating IUGR.  The total AFI was 7.53 cm (lower normal range).  A BPP performed today was 8 out of 8.  Doppler studies of the umbilical arteries showed a normal  S/D ratio of 2.58 . There were no signs of absent or reversed  end-diastolic flow.  A placenta previa continues to be noted on today's exam.  The patient was advised that hopefully, the placenta will  move away from the cervix later in her pregnancy.  We will perform a transvaginal ultrasound in 4 weeks to  assess the placental location.  Due to fetal growth restriction, we will continue to follow her  with weekly fetal testing and umbilical artery Doppler studies.  She will  return in 1 week for another BPP and umbilical artery  Doppler study. ----------------------------------------------------------------------                  Johnell Comings, MD Electronically Signed Final Report   07/05/2022 03:17 pm ----------------------------------------------------------------------  [place] MFM FETAL BPP WO NON STRESS  Result Date: 07/05/2022 ----------------------------------------------------------------------  OBSTETRICS REPORT                       (Signed Final 07/05/2022 03:17 pm) ---------------------------------------------------------------------- Patient Info  ID #:       [id number]                          D.O.B.:  [birth year] (35 yrs)  Name:       Sally Wiley              Visit Date: 07/05/2022 02:02 pm ---------------------------------------------------------------------- Performed By  Attending:        Johnell Comings MD         Ref. Address:     [address]  [place], [place]  Performed By:     Rolm Bookbinder RDMS     [facility]         [facility]                                                             [place] for                                                             Women  Referred By:      Truett Mainland                    MD ---------------------------------------------------------------------- Orders  #  Description                           Code        Ordered By  1  [place] MFM OB FOLLOW UP                   [id number]    Sander Nephew  2  [place] MFM UA CORD DOPPLER                76820.02    Sander Nephew  3  [place] MFM FETAL BPP WO NON               [id number]    [place]  ----------------------------------------------------------------------  #  Order #  Accession #                Episode #  1  [id number]                   [id number]                 [id number]  2  [id number]                   [id number]                 [id number]  3  [id number]                   [id number]                 [id number] ---------------------------------------------------------------------- Indications  Maternal care for known or suspected poor      O36.5930  fetal growth, third trimester, not applicable or  unspecified IUGR  Placenta previa specified as without           O44.03  hemorrhage, third trimester  Vaginal bleeding in pregnancy, third trimester O46.93  Cervical cerclage suture present, third        O34.33  trimester  [redacted] weeks gestation of pregnancy                Z3A.31  Cervical incompetence, third trimester         O34.33  Poor obstetric history (prior pre-term labor,  O09.219  PPROM, IUFD) ---------------------------------------------------------------------- Fetal Evaluation  Num Of Fetuses:         1  Fetal Heart Rate(bpm):  125  Cardiac Activity:       Observed  Presentation:           Cephalic  Placenta:               Posterior Previa  P. Cord Insertion:      Previously Visualized  Amniotic Fluid  AFI FV:      Subjectively low-normal  AFI Sum(cm)     %Tile       Largest Pocket(cm)  7.53            < 3         3.31  RUQ(cm)       RLQ(cm)       LUQ(cm)        LLQ(cm)  0             1.66          3.31           2.56 ---------------------------------------------------------------------- Biophysical Evaluation  Amniotic F.V:   Within normal limits       F. Tone:        Observed  F. Movement:    Observed                   Score:          8/8  F. Breathing:   Observed ---------------------------------------------------------------------- Biometry  BPD:     72.78  mm     G. Age:  29w 1d        < 1  %    CI:        68.39   %    70 - 86  FL/HC:      20.3   %    19.1 - 21.3  HC:    281.35   mm     G. Age:  30w 6d        3.2  %    HC/AC:      1.06        0.96 - 1.17  AC:    266.14   mm     G. Age:  30w 5d         18  %    FL/BPD:     78.5   %    71 - 87  FL:      57.13  mm     G. Age:  30w 0d        3.9  %    FL/AC:      21.5   %    20 - 24  Est. FW:    1561  gm      3 lb 7 oz      7  % ---------------------------------------------------------------------- OB History  Blood Type:   AB+  Gravidity:    3         Prem:   2  Living:       1 ---------------------------------------------------------------------- Gestational Age  LMP:           35w 4d        Date:  10/29/21                   EDD:   08/05/22  U/S Today:     30w 1d                                        EDD:   09/12/22  Best:          31w 6d     Det. By:  Previous Ultrasound      EDD:   08/31/22                                      (02/20/22) ---------------------------------------------------------------------- Doppler - Fetal Vessels  Umbilical Artery   S/D     %tile      RI    %tile      PI    %tile     PSV    ADFV    RDFV                                                     (cm/s)   2.58       43    0.61       49     0.9       49    46.17      No      No ---------------------------------------------------------------------- Comments  This patient was seen due to placenta previa noted on her  prior exams.  She denies any problems since her last exam  and reports feeling vigorous fetal movements throughout the  day.  On today's exam, the EFW (3 pounds 7 ounces) measures at  the 7th percentile for her gestational age indicating  IUGR.  The total AFI was 7.53 cm (lower normal range).  A BPP performed today was 8 out of 8.  Doppler studies of the umbilical arteries showed a normal  S/D ratio of 2.58 . There were no signs of absent or reversed  end-diastolic flow.  A placenta previa continues to be noted on today's exam.  The patient was advised that hopefully, the placenta will  move away from  the cervix later in her pregnancy.  We will perform a transvaginal ultrasound in 4 weeks to  assess the placental location.  Due to fetal growth restriction, we will continue to follow her  with weekly fetal testing and umbilical artery Doppler studies.  She will return in 1 week for another BPP and umbilical artery  Doppler study. ----------------------------------------------------------------------                  Ma Rings, MD Electronically Signed Final Report   07/05/2022 03:17 pm ----------------------------------------------------------------------   Assessment and Plan:  Pregnancy: [id number] at [redacted]w[redacted]d 1. IUGR (intrauterine growth restriction) affecting care of mother, third trimester Follow up scans, antenatal surveillance and delivery plan as per MFM.  2. Gestational diabetes mellitus (GDM) in third trimester, gestational diabetes method of control unspecified Random CBG today 69, she is not checking at home as she does not have supplies (insurance issues). Will resend supplies for testing.  A1C, CMET checked today.  Continue to monitor. - Comprehensive metabolic panel - Hemoglobin A1c  3. Placenta previa antepartum (posterior) Was admitted for bleeding last month, stable now, continue monitoring with scans.  4. Cervical cerclage suture present, antepartum 5. Previous preterm delivery, antepartum Cerclage in place, PTL precautions advised.  6. Anemia affecting pregnancy in third trimester Mild anemia last month, will recheck this month and manage accordingly. - CBC  7. [redacted] weeks gestation of pregnancy 8. Supervision of high risk pregnancy, antepartum No other concerns.  Preterm labor symptoms and general obstetric precautions including but not limited to vaginal bleeding, contractions, leaking of fluid and fetal movement were reviewed in detail with the patient. Please refer to After Visit Summary for other counseling recommendations.   Return in about 2 weeks (around 07/25/2022)  for OFFICE OB VISIT (MD only).  Future Appointments  Date Time Provider Department Center  07/12/2022  3:30 PM [facility] NURSE [facility]  07/12/2022  3:45 PM [facility] US4 [facility]  07/18/2022  7:15 AM [facility] NURSE [facility]  07/18/2022  7:30 AM [facility] US2 [facility]  07/24/2022  2:45 PM [facility] GDM CLASS [facility] NDM  07/25/2022 10:15 AM Levie Heritage, DO [facility] None  07/26/2022 12:45 PM [facility] NURSE [facility]  07/26/2022  1:00 PM [facility] US1 [facility]  08/02/2022  1:30 PM [facility] NURSE [facility]  08/02/2022  1:45 PM [facility] US4 [facility]  08/08/2022 10:35 AM Stinson, Rhona Raider, DO [facility] None  08/15/2022 10:35 AM Levie Heritage, DO [facility] None  08/22/2022 10:35 AM Levie Heritage, DO [facility] None  08/29/2022  9:15 AM Adrian Blackwater, Rhona Raider, DO [facility] None    Jaynie Collins, MD

## 2022-07-11 NOTE — Patient Instructions (Signed)
Return to office for any scheduled appointments. Call the office or go to the MAU at [facility] if: You begin to have strong, frequent contractions Your water breaks.  Sometimes it is a big gush of fluid, sometimes it is just a trickle that keeps getting your underwear wet or running down your legs You have vaginal bleeding.  It is normal to have a small amount of spotting if your cervix was checked.  You do not feel your baby moving like normal.  If you do not, get something to eat and drink and lay down and focus on feeling your baby move.   If your baby is still not moving like normal, you should call the office or go to MAU. Any other obstetric concerns.  

## 2022-07-12 ENCOUNTER — Ambulatory Visit: Payer: Medicaid Other | Attending: Obstetrics and Gynecology | Admitting: *Deleted

## 2022-07-12 ENCOUNTER — Ambulatory Visit (HOSPITAL_BASED_OUTPATIENT_CLINIC_OR_DEPARTMENT_OTHER): Payer: Medicaid Other

## 2022-07-12 VITALS — BP 112/70 | HR 101

## 2022-07-12 DIAGNOSIS — O4403 Placenta previa specified as without hemorrhage, third trimester: Secondary | ICD-10-CM | POA: Insufficient documentation

## 2022-07-12 DIAGNOSIS — Z3A32 32 weeks gestation of pregnancy: Secondary | ICD-10-CM | POA: Diagnosis not present

## 2022-07-12 DIAGNOSIS — O365931 Maternal care for other known or suspected poor fetal growth, third trimester, fetus 1: Secondary | ICD-10-CM | POA: Diagnosis not present

## 2022-07-12 DIAGNOSIS — O0990 Supervision of high risk pregnancy, unspecified, unspecified trimester: Secondary | ICD-10-CM

## 2022-07-12 DIAGNOSIS — O3433 Maternal care for cervical incompetence, third trimester: Secondary | ICD-10-CM | POA: Diagnosis not present

## 2022-07-12 DIAGNOSIS — O365930 Maternal care for other known or suspected poor fetal growth, third trimester, not applicable or unspecified: Secondary | ICD-10-CM | POA: Insufficient documentation

## 2022-07-12 DIAGNOSIS — O09213 Supervision of pregnancy with history of pre-term labor, third trimester: Secondary | ICD-10-CM | POA: Diagnosis not present

## 2022-07-12 DIAGNOSIS — O24419 Gestational diabetes mellitus in pregnancy, unspecified control: Secondary | ICD-10-CM | POA: Diagnosis not present

## 2022-07-12 DIAGNOSIS — O09293 Supervision of pregnancy with other poor reproductive or obstetric history, third trimester: Secondary | ICD-10-CM | POA: Insufficient documentation

## 2022-07-12 DIAGNOSIS — O24410 Gestational diabetes mellitus in pregnancy, diet controlled: Secondary | ICD-10-CM | POA: Diagnosis not present

## 2022-07-12 LAB — COMPREHENSIVE METABOLIC PANEL
ALT: 9 IU/L (ref 0–32)
AST: 15 IU/L (ref 0–40)
Albumin/Globulin Ratio: 1.3 (ref 1.2–2.2)
Albumin: 3.5 g/dL — ABNORMAL LOW (ref 3.9–4.9)
Alkaline Phosphatase: 130 IU/L — ABNORMAL HIGH (ref 44–121)
BUN/Creatinine Ratio: 10 (ref 9–23)
BUN: 5 mg/dL — ABNORMAL LOW (ref 6–20)
Bilirubin Total: <0.2 mg/dL (ref 0.0–1.2)
CO2: 16 mmol/L — ABNORMAL LOW (ref 20–29)
Calcium: 8.7 mg/dL (ref 8.7–10.2)
Chloride: 104 mmol/L (ref 96–106)
Creatinine, Ser: 0.49 mg/dL — ABNORMAL LOW (ref 0.57–1.00)
Globulin, Total: 2.8 g/dL (ref 1.5–4.5)
Glucose: 83 mg/dL (ref 70–99)
Potassium: 3.7 mmol/L (ref 3.5–5.2)
Sodium: 137 mmol/L (ref 134–144)
Total Protein: 6.3 g/dL (ref 6.0–8.5)
eGFR: 128 mL/min/{1.73_m2} (ref >59)

## 2022-07-12 LAB — CBC
Hematocrit: 30.4 % — ABNORMAL LOW (ref 34.0–46.6)
Hemoglobin: 10.2 g/dL — ABNORMAL LOW (ref 11.1–15.9)
MCH: 29.6 pg (ref 26.6–33.0)
MCHC: 33.6 g/dL (ref 31.5–35.7)
MCV: 88 fL (ref 79–97)
Platelets: 281 10*3/uL (ref 150–450)
RBC: 3.45 x10E6/uL — ABNORMAL LOW (ref 3.77–5.28)
RDW: 12.3 % (ref 11.7–15.4)
WBC: 10 10*3/uL (ref 3.4–10.8)

## 2022-07-12 LAB — HEMOGLOBIN A1C
Est. average glucose Bld gHb Est-mCnc: 108 mg/dL
Hgb A1c MFr Bld: 5.4 % (ref 4.8–5.6)

## 2022-07-12 MED ORDER — FERROUS GLUCONATE 324 (38 FE) MG PO TABS: 324.0000 mg | ORAL_TABLET | Freq: Every day | ORAL | 3 refills | Status: DC

## 2022-07-12 NOTE — Addendum Note (Signed)
Addended by: Jaynie Collins A on: 07/12/2022 06:49 AM   Modules accepted: Orders

## 2022-07-16 ENCOUNTER — Other Ambulatory Visit: Payer: Self-pay | Admitting: Family Medicine

## 2022-07-16 DIAGNOSIS — O4400 Placenta previa specified as without hemorrhage, unspecified trimester: Secondary | ICD-10-CM

## 2022-07-18 ENCOUNTER — Ambulatory Visit (HOSPITAL_BASED_OUTPATIENT_CLINIC_OR_DEPARTMENT_OTHER): Payer: Medicaid Other

## 2022-07-18 ENCOUNTER — Ambulatory Visit: Payer: Medicaid Other | Attending: Obstetrics and Gynecology | Admitting: *Deleted

## 2022-07-18 ENCOUNTER — Other Ambulatory Visit: Payer: Self-pay | Admitting: *Deleted

## 2022-07-18 VITALS — BP 121/67 | HR 96

## 2022-07-18 DIAGNOSIS — O3433 Maternal care for cervical incompetence, third trimester: Secondary | ICD-10-CM | POA: Diagnosis not present

## 2022-07-18 DIAGNOSIS — O4413 Placenta previa with hemorrhage, third trimester: Secondary | ICD-10-CM | POA: Insufficient documentation

## 2022-07-18 DIAGNOSIS — O09899 Supervision of other high risk pregnancies, unspecified trimester: Secondary | ICD-10-CM

## 2022-07-18 DIAGNOSIS — O42913 Preterm premature rupture of membranes, unspecified as to length of time between rupture and onset of labor, third trimester: Secondary | ICD-10-CM | POA: Diagnosis not present

## 2022-07-18 DIAGNOSIS — O365930 Maternal care for other known or suspected poor fetal growth, third trimester, not applicable or unspecified: Secondary | ICD-10-CM | POA: Insufficient documentation

## 2022-07-18 DIAGNOSIS — O365931 Maternal care for other known or suspected poor fetal growth, third trimester, fetus 1: Secondary | ICD-10-CM

## 2022-07-18 DIAGNOSIS — O24419 Gestational diabetes mellitus in pregnancy, unspecified control: Secondary | ICD-10-CM | POA: Diagnosis not present

## 2022-07-18 DIAGNOSIS — O09293 Supervision of pregnancy with other poor reproductive or obstetric history, third trimester: Secondary | ICD-10-CM | POA: Diagnosis not present

## 2022-07-18 DIAGNOSIS — O09213 Supervision of pregnancy with history of pre-term labor, third trimester: Secondary | ICD-10-CM | POA: Insufficient documentation

## 2022-07-18 DIAGNOSIS — O24410 Gestational diabetes mellitus in pregnancy, diet controlled: Secondary | ICD-10-CM

## 2022-07-18 DIAGNOSIS — O4403 Placenta previa specified as without hemorrhage, third trimester: Secondary | ICD-10-CM | POA: Diagnosis not present

## 2022-07-18 DIAGNOSIS — Z3A33 33 weeks gestation of pregnancy: Secondary | ICD-10-CM | POA: Insufficient documentation

## 2022-07-18 DIAGNOSIS — Z8759 Personal history of other complications of pregnancy, childbirth and the puerperium: Secondary | ICD-10-CM

## 2022-07-18 DIAGNOSIS — O0990 Supervision of high risk pregnancy, unspecified, unspecified trimester: Secondary | ICD-10-CM

## 2022-07-24 ENCOUNTER — Ambulatory Visit: Payer: Medicaid Other

## 2022-07-25 ENCOUNTER — Ambulatory Visit (INDEPENDENT_AMBULATORY_CARE_PROVIDER_SITE_OTHER): Payer: Medicaid Other | Admitting: Family Medicine

## 2022-07-25 VITALS — BP 108/66 | HR 100 | Wt 148.0 lb

## 2022-07-25 DIAGNOSIS — O24419 Gestational diabetes mellitus in pregnancy, unspecified control: Secondary | ICD-10-CM

## 2022-07-25 DIAGNOSIS — Z8759 Personal history of other complications of pregnancy, childbirth and the puerperium: Secondary | ICD-10-CM

## 2022-07-25 DIAGNOSIS — Z3A34 34 weeks gestation of pregnancy: Secondary | ICD-10-CM | POA: Diagnosis not present

## 2022-07-25 DIAGNOSIS — O4400 Placenta previa specified as without hemorrhage, unspecified trimester: Secondary | ICD-10-CM

## 2022-07-25 DIAGNOSIS — O365930 Maternal care for other known or suspected poor fetal growth, third trimester, not applicable or unspecified: Secondary | ICD-10-CM

## 2022-07-25 DIAGNOSIS — O0993 Supervision of high risk pregnancy, unspecified, third trimester: Secondary | ICD-10-CM | POA: Diagnosis not present

## 2022-07-25 DIAGNOSIS — O0990 Supervision of high risk pregnancy, unspecified, unspecified trimester: Secondary | ICD-10-CM

## 2022-07-25 NOTE — Progress Notes (Signed)
   PRENATAL VISIT NOTE  Subjective:  Sally Wiley is a 36 y.o. G3P0201 at [redacted]w[redacted]d being seen today for ongoing prenatal care.  She is currently monitored for the following issues for this high-risk pregnancy and has History of cervical incompetence in pregnancy, currently pregnant; Cervical cerclage suture present, antepartum; Previous preterm delivery, antepartum; Supervision of high risk pregnancy, antepartum; History of preterm premature rupture of membranes (PPROM); Placenta previa antepartum (posterior); Placenta previa with hemorrhage in second trimester; Gestational diabetes mellitus (GDM) in third trimester; IUGR (intrauterine growth restriction) affecting care of mother, third trimester; and Request for sterilization on their problem list.  Patient reports occasional contractions.  Contractions: Not present. Vag. Bleeding: None.  Movement: Present. Denies leaking of fluid.   The following portions of the patient's history were reviewed and updated as appropriate: allergies, current medications, past family history, past medical history, past social history, past surgical history and problem list.   Objective:   Vitals:   07/25/22 1005  BP: 108/66  Pulse: 100  Weight: 148 lb (67.1 kg)    Fetal Status:     Movement: Present     General:  Alert, oriented and cooperative. Patient is in no acute distress.  Skin: Skin is warm and dry. No rash noted.   Cardiovascular: Normal heart rate noted  Respiratory: Normal respiratory effort, no problems with respiration noted  Abdomen: Soft, gravid, appropriate for gestational age.  Pain/Pressure: Present     Pelvic: Cervical exam deferred        Extremities: Normal range of motion.  Edema: None  Mental Status: Normal mood and affect. Normal behavior. Normal judgment and thought content.   Assessment and Plan:  Pregnancy: G3P0201 at [redacted]w[redacted]d 1. [redacted] weeks gestation of pregnancy  2. Supervision of high risk pregnancy, antepartum FHT and FH  normal  3. Gestational diabetes mellitus (GDM) in third trimester, gestational diabetes method of control unspecified CBGs controlled  4. History of preterm premature rupture of membranes (PPROM) With history of incompetent cervix Cerclage in place  5. Placenta previa antepartum (posterior) TV US tomorrow to evaluate   6. IUGR (intrauterine growth restriction) affecting care of mother, third trimester Growth tomorrow. Dopplers normal.  Preterm labor symptoms and general obstetric precautions including but not limited to vaginal bleeding, contractions, leaking of fluid and fetal movement were reviewed in detail with the patient. Please refer to After Visit Summary for other counseling recommendations.   No follow-ups on file.  Future Appointments  Date Time Provider [place]  07/26/2022 12:45 PM [facility] NURSE [facility]  07/26/2022  1:00 PM [facility] US1 [facility]  08/08/2022 10:35 AM Truett Mainland, DO [facility] None  08/15/2022 10:35 AM Truett Mainland, DO [facility] None  08/22/2022 10:35 AM Truett Mainland, DO [facility] None  08/29/2022  9:15 AM Truett Mainland, DO [facility] None    Truett Mainland, DO

## 2022-07-26 ENCOUNTER — Other Ambulatory Visit: Payer: Self-pay | Admitting: *Deleted

## 2022-07-26 ENCOUNTER — Ambulatory Visit: Payer: Medicaid Other | Attending: Obstetrics and Gynecology | Admitting: *Deleted

## 2022-07-26 ENCOUNTER — Ambulatory Visit (HOSPITAL_BASED_OUTPATIENT_CLINIC_OR_DEPARTMENT_OTHER): Payer: Medicaid Other

## 2022-07-26 VITALS — BP 105/72 | HR 114

## 2022-07-26 DIAGNOSIS — O4413 Placenta previa with hemorrhage, third trimester: Secondary | ICD-10-CM | POA: Diagnosis not present

## 2022-07-26 DIAGNOSIS — Z3A34 34 weeks gestation of pregnancy: Secondary | ICD-10-CM

## 2022-07-26 DIAGNOSIS — O365931 Maternal care for other known or suspected poor fetal growth, third trimester, fetus 1: Secondary | ICD-10-CM

## 2022-07-26 DIAGNOSIS — O365930 Maternal care for other known or suspected poor fetal growth, third trimester, not applicable or unspecified: Secondary | ICD-10-CM | POA: Insufficient documentation

## 2022-07-26 DIAGNOSIS — O09293 Supervision of pregnancy with other poor reproductive or obstetric history, third trimester: Secondary | ICD-10-CM | POA: Diagnosis not present

## 2022-07-26 DIAGNOSIS — O09213 Supervision of pregnancy with history of pre-term labor, third trimester: Secondary | ICD-10-CM | POA: Diagnosis not present

## 2022-07-26 DIAGNOSIS — O3433 Maternal care for cervical incompetence, third trimester: Secondary | ICD-10-CM

## 2022-07-26 DIAGNOSIS — O24410 Gestational diabetes mellitus in pregnancy, diet controlled: Secondary | ICD-10-CM

## 2022-07-26 DIAGNOSIS — O24419 Gestational diabetes mellitus in pregnancy, unspecified control: Secondary | ICD-10-CM | POA: Insufficient documentation

## 2022-07-26 DIAGNOSIS — O4403 Placenta previa specified as without hemorrhage, third trimester: Secondary | ICD-10-CM

## 2022-07-26 DIAGNOSIS — O09893 Supervision of other high risk pregnancies, third trimester: Secondary | ICD-10-CM

## 2022-07-26 DIAGNOSIS — Z8759 Personal history of other complications of pregnancy, childbirth and the puerperium: Secondary | ICD-10-CM

## 2022-07-26 DIAGNOSIS — O0990 Supervision of high risk pregnancy, unspecified, unspecified trimester: Secondary | ICD-10-CM

## 2022-07-31 NOTE — Patient Instructions (Signed)
[name]  07/31/2022   Your procedure is scheduled on:  08/10/2022  Arrive at 71 at [employer] [facility] [address] on [address] at [facility]  and [address]. You are invited to use the FREE valet parking or use the Visitor's parking deck.  Pick up the phone at the desk and dial [phone].  Call this number if you have problems the morning of surgery: [phone]  Remember:   Do not eat food:(After Midnight) Desps de medianoche.  Do not drink clear liquids: (After Midnight) Desps de medianoche.  Take these medicines the morning of surgery with A SIP OF WATER:  none   Do not wear jewelry, make-up or nail polish.  Do not wear lotions, powders, or perfumes. Do not wear deodorant.  Do not shave 48 hours prior to surgery.  Do not bring valuables to the hospital.  [facility] is not   responsible for any belongings or valuables brought to the hospital.  Contacts, dentures or bridgework may not be worn into surgery.  Leave suitcase in the car. After surgery it may be brought to your room.  For patients admitted to the hospital, checkout time is 11:00 AM the day of              discharge.      Please read over the following fact sheets that you were given:     Preparing for Surgery

## 2022-08-01 ENCOUNTER — Encounter (HOSPITAL_COMMUNITY): Payer: Self-pay

## 2022-08-02 ENCOUNTER — Ambulatory Visit: Payer: Medicaid Other

## 2022-08-02 ENCOUNTER — Ambulatory Visit: Payer: Medicaid Other | Admitting: *Deleted

## 2022-08-02 ENCOUNTER — Other Ambulatory Visit: Payer: 59

## 2022-08-02 ENCOUNTER — Ambulatory Visit: Payer: Medicaid Other | Attending: Obstetrics

## 2022-08-02 VITALS — BP 110/65 | HR 114

## 2022-08-02 DIAGNOSIS — O09893 Supervision of other high risk pregnancies, third trimester: Secondary | ICD-10-CM | POA: Diagnosis not present

## 2022-08-02 DIAGNOSIS — O365930 Maternal care for other known or suspected poor fetal growth, third trimester, not applicable or unspecified: Secondary | ICD-10-CM | POA: Insufficient documentation

## 2022-08-02 DIAGNOSIS — O0990 Supervision of high risk pregnancy, unspecified, unspecified trimester: Secondary | ICD-10-CM | POA: Insufficient documentation

## 2022-08-02 DIAGNOSIS — Z8759 Personal history of other complications of pregnancy, childbirth and the puerperium: Secondary | ICD-10-CM | POA: Insufficient documentation

## 2022-08-02 DIAGNOSIS — O365931 Maternal care for other known or suspected poor fetal growth, third trimester, fetus 1: Secondary | ICD-10-CM | POA: Diagnosis not present

## 2022-08-02 DIAGNOSIS — O24410 Gestational diabetes mellitus in pregnancy, diet controlled: Secondary | ICD-10-CM | POA: Insufficient documentation

## 2022-08-02 DIAGNOSIS — O24419 Gestational diabetes mellitus in pregnancy, unspecified control: Secondary | ICD-10-CM | POA: Diagnosis not present

## 2022-08-02 DIAGNOSIS — O3433 Maternal care for cervical incompetence, third trimester: Secondary | ICD-10-CM | POA: Diagnosis not present

## 2022-08-02 DIAGNOSIS — O4403 Placenta previa specified as without hemorrhage, third trimester: Secondary | ICD-10-CM | POA: Insufficient documentation

## 2022-08-07 NOTE — Anesthesia Preprocedure Evaluation (Addendum)
Anesthesia Evaluation  Patient identified by MRN, date of birth, ID band Patient awake    Reviewed: Allergy & Precautions, NPO status , Patient's Chart, lab work & pertinent test results  History of Anesthesia Complications Negative for: history of anesthetic complications  Airway Mallampati: II  TM Distance: >3 FB Neck ROM: Full    Dental no notable dental hx.    Pulmonary neg pulmonary ROS,    Pulmonary exam normal        Cardiovascular negative cardio ROS Normal cardiovascular exam     Neuro/Psych negative neurological ROS  negative psych ROS   GI/Hepatic negative GI ROS, Neg liver ROS,   Endo/Other  diabetes, Gestational  Renal/GU negative Renal ROS  negative genitourinary   Musculoskeletal negative musculoskeletal ROS (+)   Abdominal   Peds  Hematology negative hematology ROS (+)   Anesthesia Other Findings Day of surgery medications reviewed with patient.  Reproductive/Obstetrics (+) Pregnancy (posterior placenta previa, cerclage in place)                            Anesthesia Physical Anesthesia Plan  ASA: 2  Anesthesia Plan: Spinal   Post-op Pain Management:    Induction:   PONV Risk Score and Plan: 4 or greater and Treatment may vary due to age or medical condition, Ondansetron and Dexamethasone  Airway Management Planned: Natural Airway  Additional Equipment: None  Intra-op Plan:   Post-operative Plan:   Informed Consent: I have reviewed the patients History and Physical, chart, labs and discussed the procedure including the risks, benefits and alternatives for the proposed anesthesia with the patient or authorized representative who has indicated his/her understanding and acceptance.       Plan Discussed with: CRNA  Anesthesia Plan Comments:        Anesthesia Quick Evaluation

## 2022-08-08 ENCOUNTER — Ambulatory Visit (INDEPENDENT_AMBULATORY_CARE_PROVIDER_SITE_OTHER): Payer: Medicaid Other | Admitting: Family Medicine

## 2022-08-08 ENCOUNTER — Encounter (HOSPITAL_COMMUNITY)
Admission: RE | Admit: 2022-08-08 | Discharge: 2022-08-08 | Disposition: A | Discharge status: Home / Self Care | Payer: Medicaid Other | Source: Ambulatory Visit | Attending: Family Medicine | Admitting: Family Medicine

## 2022-08-08 ENCOUNTER — Other Ambulatory Visit (HOSPITAL_COMMUNITY)
Admission: RE | Admit: 2022-08-08 | Discharge: 2022-08-08 | Disposition: A | Discharge status: Home / Self Care | Payer: Medicaid Other | Source: Ambulatory Visit | Attending: Family Medicine | Admitting: Family Medicine

## 2022-08-08 VITALS — BP 116/72 | HR 108 | Wt 148.0 lb

## 2022-08-08 DIAGNOSIS — Z3A36 36 weeks gestation of pregnancy: Secondary | ICD-10-CM | POA: Insufficient documentation

## 2022-08-08 DIAGNOSIS — O0990 Supervision of high risk pregnancy, unspecified, unspecified trimester: Secondary | ICD-10-CM | POA: Insufficient documentation

## 2022-08-08 DIAGNOSIS — O4400 Placenta previa specified as without hemorrhage, unspecified trimester: Secondary | ICD-10-CM | POA: Insufficient documentation

## 2022-08-08 DIAGNOSIS — O09299 Supervision of pregnancy with other poor reproductive or obstetric history, unspecified trimester: Secondary | ICD-10-CM

## 2022-08-08 DIAGNOSIS — O0993 Supervision of high risk pregnancy, unspecified, third trimester: Secondary | ICD-10-CM | POA: Diagnosis not present

## 2022-08-08 DIAGNOSIS — O3430 Maternal care for cervical incompetence, unspecified trimester: Secondary | ICD-10-CM

## 2022-08-08 DIAGNOSIS — O24419 Gestational diabetes mellitus in pregnancy, unspecified control: Secondary | ICD-10-CM

## 2022-08-08 DIAGNOSIS — O09293 Supervision of pregnancy with other poor reproductive or obstetric history, third trimester: Secondary | ICD-10-CM | POA: Diagnosis not present

## 2022-08-08 DIAGNOSIS — O3433 Maternal care for cervical incompetence, third trimester: Secondary | ICD-10-CM

## 2022-08-08 DIAGNOSIS — O4403 Placenta previa specified as without hemorrhage, third trimester: Secondary | ICD-10-CM | POA: Diagnosis not present

## 2022-08-08 DIAGNOSIS — Z302 Encounter for sterilization: Secondary | ICD-10-CM

## 2022-08-08 DIAGNOSIS — O365930 Maternal care for other known or suspected poor fetal growth, third trimester, not applicable or unspecified: Secondary | ICD-10-CM

## 2022-08-08 LAB — CBC
HCT: 32.3 % — ABNORMAL LOW (ref 36.0–46.0)
Hemoglobin: 10.8 g/dL — ABNORMAL LOW (ref 12.0–15.0)
MCH: 29.8 pg (ref 26.0–34.0)
MCHC: 33.4 g/dL (ref 30.0–36.0)
MCV: 89.2 fL (ref 80.0–100.0)
Platelets: 272 10*3/uL (ref 150–400)
RBC: 3.62 MIL/uL — ABNORMAL LOW (ref 3.87–5.11)
RDW: 13.1 % (ref 11.5–15.5)
WBC: 10.2 10*3/uL (ref 4.0–10.5)
nRBC: 0 % (ref 0.0–0.2)

## 2022-08-08 LAB — TYPE AND SCREEN
ABO/RH(D): AB POS
Antibody Screen: NEGATIVE

## 2022-08-08 NOTE — Progress Notes (Addendum)
   PRENATAL VISIT NOTE  Subjective:  Sally Wiley is a 36 y.o. G3P0201 at [redacted]w[redacted]d being seen today for ongoing prenatal care.  She is currently monitored for the following issues for this high-risk pregnancy and has History of cervical incompetence in pregnancy, currently pregnant; Cervical cerclage suture present, antepartum; Previous preterm delivery, antepartum; Supervision of high risk pregnancy, antepartum; History of preterm premature rupture of membranes (PPROM); Placenta previa antepartum (posterior); Placenta previa with hemorrhage in second trimester; Gestational diabetes mellitus (GDM) in third trimester; IUGR (intrauterine growth restriction) affecting care of mother, third trimester; and Request for sterilization on their problem list.  Patient reports no complaints.  Contractions: Irritability. Vag. Bleeding: None.  Movement: Present. Denies leaking of fluid.   The following portions of the patient's history were reviewed and updated as appropriate: allergies, current medications, past family history, past medical history, past social history, past surgical history and problem list.   Objective:   Vitals:   08/08/22 1019  BP: 116/72  Pulse: (!) 108  Weight: 148 lb (67.1 kg)    Fetal Status: Fetal Heart Rate (bpm): 142   Movement: Present     General:  Alert, oriented and cooperative. Patient is in no acute distress.  Skin: Skin is warm and dry. No rash noted.   Cardiovascular: Normal heart rate noted  Respiratory: Normal respiratory effort, no problems with respiration noted  Abdomen: Soft, gravid, appropriate for gestational age.  Pain/Pressure: Present     Pelvic: Cervical exam deferred        Extremities: Normal range of motion.  Edema: None  Mental Status: Normal mood and affect. Normal behavior. Normal judgment and thought content.   Assessment and Plan:  Pregnancy: G3P0201 at [redacted]w[redacted]d 1. [redacted] weeks gestation of pregnancy - Culture, beta strep (group b only) -  GC/Chlamydia probe amp ([place])not at [facility]  2. History of cervical incompetence in pregnancy, currently pregnant Cerclage in place - Culture, beta strep (group b only) - GC/Chlamydia probe amp ([place])not at [facility]  3. Supervision of high risk pregnancy, antepartum FHT and FH normal - Culture, beta strep (group b only) - GC/Chlamydia probe amp ([place])not at [facility]  4. Gestational diabetes mellitus (GDM) in third trimester, gestational diabetes method of control unspecified controlled  5. Request for sterilization BTL with C/s  6. Placenta previa antepartum (posterior) C/s saturday  7. IUGR (intrauterine growth restriction) affecting care of mother, third trimester  8. Cervical cerclage suture present, antepartum   Preterm labor symptoms and general obstetric precautions including but not limited to vaginal bleeding, contractions, leaking of fluid and fetal movement were reviewed in detail with the patient. Please refer to After Visit Summary for other counseling recommendations.   No follow-ups on file.  Future Appointments  Date Time Provider [place]  08/22/2022 10:35 AM Truett Mainland, DO [facility] None  10/02/2022  9:35 AM Nehemiah Settle Tanna Savoy, DO [facility] None    Truett Mainland, DO

## 2022-08-09 ENCOUNTER — Other Ambulatory Visit: Payer: 59

## 2022-08-09 ENCOUNTER — Ambulatory Visit: Payer: 59

## 2022-08-09 LAB — GC/CHLAMYDIA PROBE AMP (~~LOC~~) NOT AT ARMC
Chlamydia: NEGATIVE
Comment: NEGATIVE
Comment: NORMAL
Neisseria Gonorrhea: NEGATIVE

## 2022-08-09 LAB — RPR: RPR Ser Ql: NONREACTIVE

## 2022-08-09 NOTE — H&P (Signed)
OBSTETRIC ADMISSION HISTORY AND PHYSICAL  Sally Wiley is a 36 y.o. female [phone] with IUP at 48w0dby ultrasound presenting for scheduled cesarean delivery for posterior placental previa and IUGR. Also desires bilateral tubal sterilization, also will get cerclage removal.  She reports +FMs, No LOF, no VB, no blurry vision, headaches or peripheral edema, and RUQ pain.  She plans on breast and bottle feeding. She request BTL (signed 8//18/2023) for birth control. [facility]   Dating: By early ultrasound --->  Estimated Date of Delivery: 08/31/22  '@[redacted]w[redacted]d' , CWD, normal anatomy, cephalic  presentation, posterior previa, 2113 g, 8% EFW ('@[redacted]w[redacted]d' )  Prenatal History/Complications:  -placenta previa w/ hemorrhage -Hx of cervical incompetence with cerclage -Hx of PPROM w/ preterm delivery (22wk loss/ head entrapment requiring a Duherssen's incision and cervical repair -A1GDM -FGR  Past Medical History: Past Medical History:  Diagnosis Date   Bartholin's gland abscess 09/13/2011    Past Surgical History: Past Surgical History:  Procedure Laterality Date   CERVICAL CERCLAGE N/A 12/16/2019   Procedure: CERCLAGE CERVICAL;  Surgeon: PAletha Halim MD;  Location: [facility] LD ORS;  Service: Gynecology;  Laterality: N/A;   CERVICAL CERCLAGE N/A 03/07/2022   Procedure: CERCLAGE CERVICAL;  Surgeon: AWoodroe Mode MD;  Location: [facility] LD ORS;  Service: Gynecology;  Laterality: N/A;   DILATION AND CURETTAGE OF UTERUS      Obstetrical History: OB History     Gravida  3   Para  2   Term      Preterm  2   AB      Living  1      SAB      IAB      Ectopic      Multiple  0   Live Births  1        Obstetric Comments  Had cervical incompetence with first pregnancy, delivered at 22wks, breech with head entrapment with subsequent Duhrssen's Incision and cervical repair. [facility]        Social History Social History    Socioeconomic History   Marital status: Single    Spouse name: Not on file   Number of children: Not on file   Years of education: Not on file   Highest education level: Not on file  Occupational History   Not on file  Tobacco Use   Smoking status: Never   Smokeless tobacco: Never  Vaping Use   Vaping Use: Never used  Substance and Sexual Activity   Alcohol use: Not Currently    Comment: socailly   Drug use: Never   Sexual activity: Not Currently  Other Topics Concern   Not on file  Social History Narrative   Not on file   Social Determinants of Health   Financial Resource Strain: Not on file  Food Insecurity: Not on file  Transportation Needs: Not on file  Physical Activity: Not on file  Stress: Not on file  Social Connections: Not on file   Family History: Family History  Problem Relation Age of Onset   Heart disease Mother    Hypertension Mother    Cancer Paternal Grandmother    Diabetes Neg Hx    Asthma Neg Hx    Stroke Neg Hx    Allergies: Allergies  Allergen Reactions   Shellfish Allergy Hives    Crawfish   Medications Prior to Admission  Medication Sig Dispense Refill Last Dose   ferrous gluconate (FERGON) 324  MG tablet Take 1 tablet (324 mg total) by mouth daily with breakfast. 30 tablet 3    Prenatal Vit-Fe Fumarate-FA (MULTIVITAMIN-PRENATAL) 27-0.8 MG TABS tablet Take 1 tablet by mouth daily.      Accu-Chek Softclix Lancets lancets 1 each by Other route 4 (four) times daily. Use as instructed 100 each 12    Blood Glucose Monitoring Suppl (ACCU-CHEK GUIDE) w/Device KIT 1 kit by Does not apply route 4 (four) times daily. 1 kit 0    glucose blood (ACCU-CHEK GUIDE) test strip Use as instructed 100 each 12    Review of Systems  All systems reviewed and negative except as stated in HPI  Blood pressure 113/77, pulse 85, temperature 97.9 F (36.6 C), temperature source Oral, resp. rate 18, height '5\' 3"'  (1.6 m), weight 67.8 kg, last menstrual period  10/29/2021. General appearance: alert and no distress Lungs: clear to auscultation bilaterally Heart: regular rate and rhythm Abdomen: soft, gravid, non-tender; bowel sounds normal Pelvic: deferred Extremities: Homans sign is negative, no sign of DVT Presentation: cephalic Fetal monitoring 155 bpm    Prenatal labs: ABO, Rh: --/--/AB POS (10/12 1141) Antibody: NEG (10/12 1141) Rubella: 13.10 (03/23 1411) RPR: NON REACTIVE (10/12 1133)  HBsAg: Negative (03/23 1411)  HCVAb: Non Reactive (08/18 1056) HIV: Non Reactive (08/18 1056)  GBS:   Pending 2 hr Glucola abnormal 68/203/175 > GDM Genetic screening  LR female Anatomy US left hand not well seen, otherwise nml  Results for orders placed or performed during the hospital encounter of 08/10/22 (from the past 24 hour(s))  Glucose, capillary   Collection Time: 08/10/22  8:19 AM  Result Value Ref Range   Glucose-Capillary 70 70 - 99 mg/dL    Patient Active Problem List   Diagnosis Date Noted   IUGR (intrauterine growth restriction) affecting care of mother, third trimester 07/11/2022   Request for sterilization 07/11/2022   Gestational diabetes mellitus (GDM) in third trimester 07/02/2022   Placenta previa with hemorrhage in second trimester 05/28/2022   Placenta previa antepartum (posterior) 05/06/2022   Supervision of high risk pregnancy, antepartum 01/17/2022   History of preterm premature rupture of membranes (PPROM) 01/17/2022   Previous preterm delivery, antepartum 04/17/2020   Cervical cerclage suture present, antepartum 12/16/2019   History of cervical incompetence in pregnancy, currently pregnant 08/12/2019   Assessment/Plan:  Sally Wiley is a 36 y.o. G3P0201 with IUGR, posterior placenta, cerclage in place for cervical incompetence and A1GDM at 66w0dhere for scheduled cesarean section, removal of cervical cerclage, bilateral tubal sterilization.  The risks of cesarean section were discussed with the patient  including but were not limited to: bleeding which may require transfusion or reoperation; infection which may require antibiotics; injury to bowel, bladder, ureters or other surrounding organs; injury to the fetus; need for additional procedures including hysterectomy in the event of a life-threatening hemorrhage; formation of adhesions; placental abnormalities wth subsequent pregnancies; incisional problems; thromboembolic phenomenon and other postoperative/anesthesia complications.  Patient also desires permanent sterilization.  Other reversible forms of contraception were discussed with patient; she declines all other modalities. This will be done either via bilateral salpingectomy or bilateral application of Filshie clips.  Risks of procedure discussed with patient including but not limited to: risk of regret, permanence of method, bleeding, infection, injury to surrounding organs and need for additional procedures.  Failure risk of less than 1% with increased risk of ectopic gestation if pregnancy occurs was also discussed with patient.  Also discussed possibility of post-tubal syndrome with increased  pelvic pain or menstrual irregularities. For the cerclage removal, discussed additional possible risk of leaving a piece of the suture embedded in the cervix which may not be successfully removed.  The patient concurred with the proposed plan, giving informed written consent for the procedures.  Patient has been NPO since she will remain NPO for procedure. Anesthesia and OR aware.  Preoperative prophylactic antibiotics and SCDs ordered on call to the OR.  To OR when ready.   Verita Schneiders, MD  08/10/2022, 9:22 AM

## 2022-08-10 ENCOUNTER — Encounter (HOSPITAL_COMMUNITY): Payer: Self-pay | Admitting: Obstetrics & Gynecology

## 2022-08-10 ENCOUNTER — Inpatient Hospital Stay (HOSPITAL_COMMUNITY)
Admission: RE | Admit: 2022-08-10 | Discharge: 2022-08-12 | DRG: 783 | Disposition: A | Discharge status: Home / Self Care | Payer: Medicaid Other | Attending: Obstetrics & Gynecology | Admitting: Obstetrics & Gynecology

## 2022-08-10 ENCOUNTER — Inpatient Hospital Stay (HOSPITAL_COMMUNITY): Payer: Medicaid Other | Admitting: Anesthesiology

## 2022-08-10 ENCOUNTER — Other Ambulatory Visit: Payer: Self-pay

## 2022-08-10 ENCOUNTER — Encounter (HOSPITAL_COMMUNITY)
Admission: RE | Disposition: A | Discharge status: Home / Self Care | Payer: Self-pay | Source: Home / Self Care | Attending: Obstetrics & Gynecology

## 2022-08-10 DIAGNOSIS — Z4802 Encounter for removal of sutures: Secondary | ICD-10-CM | POA: Diagnosis not present

## 2022-08-10 DIAGNOSIS — O9081 Anemia of the puerperium: Secondary | ICD-10-CM | POA: Diagnosis not present

## 2022-08-10 DIAGNOSIS — Z302 Encounter for sterilization: Secondary | ICD-10-CM | POA: Diagnosis not present

## 2022-08-10 DIAGNOSIS — O3433 Maternal care for cervical incompetence, third trimester: Secondary | ICD-10-CM | POA: Diagnosis not present

## 2022-08-10 DIAGNOSIS — Z3A37 37 weeks gestation of pregnancy: Secondary | ICD-10-CM

## 2022-08-10 DIAGNOSIS — O0990 Supervision of high risk pregnancy, unspecified, unspecified trimester: Secondary | ICD-10-CM

## 2022-08-10 DIAGNOSIS — Z98891 History of uterine scar from previous surgery: Secondary | ICD-10-CM | POA: Diagnosis not present

## 2022-08-10 DIAGNOSIS — O09299 Supervision of pregnancy with other poor reproductive or obstetric history, unspecified trimester: Secondary | ICD-10-CM

## 2022-08-10 DIAGNOSIS — O3430 Maternal care for cervical incompetence, unspecified trimester: Secondary | ICD-10-CM | POA: Diagnosis present

## 2022-08-10 DIAGNOSIS — O4413 Placenta previa with hemorrhage, third trimester: Secondary | ICD-10-CM | POA: Diagnosis not present

## 2022-08-10 DIAGNOSIS — D62 Acute posthemorrhagic anemia: Secondary | ICD-10-CM | POA: Diagnosis not present

## 2022-08-10 DIAGNOSIS — O365930 Maternal care for other known or suspected poor fetal growth, third trimester, not applicable or unspecified: Principal | ICD-10-CM | POA: Diagnosis present

## 2022-08-10 DIAGNOSIS — O24419 Gestational diabetes mellitus in pregnancy, unspecified control: Secondary | ICD-10-CM | POA: Diagnosis present

## 2022-08-10 DIAGNOSIS — O24420 Gestational diabetes mellitus in childbirth, diet controlled: Secondary | ICD-10-CM | POA: Diagnosis not present

## 2022-08-10 DIAGNOSIS — O4403 Placenta previa specified as without hemorrhage, third trimester: Secondary | ICD-10-CM

## 2022-08-10 DIAGNOSIS — O4400 Placenta previa specified as without hemorrhage, unspecified trimester: Secondary | ICD-10-CM | POA: Diagnosis present

## 2022-08-10 DIAGNOSIS — Z3009 Encounter for other general counseling and advice on contraception: Secondary | ICD-10-CM | POA: Diagnosis not present

## 2022-08-10 DIAGNOSIS — Z9851 Tubal ligation status: Secondary | ICD-10-CM

## 2022-08-10 DIAGNOSIS — O09293 Supervision of pregnancy with other poor reproductive or obstetric history, third trimester: Secondary | ICD-10-CM | POA: Diagnosis not present

## 2022-08-10 LAB — GLUCOSE, CAPILLARY
Glucose-Capillary: 70 mg/dL (ref 70–99)
Glucose-Capillary: 63 mg/dL — ABNORMAL LOW (ref 70–99)

## 2022-08-10 SURGERY — Surgical Case
Anesthesia: Spinal | Laterality: Bilateral | Wound class: Clean Contaminated

## 2022-08-10 MED ORDER — FENTANYL CITRATE (PF) 100 MCG/2ML IJ SOLN
INTRAMUSCULAR | Status: DC | PRN
  Administered 2022-08-10: 15 ug via INTRATHECAL

## 2022-08-10 MED ORDER — MEASLES, MUMPS & RUBELLA VAC IJ SOLR: 0.5000 mL | Freq: Once | INTRAMUSCULAR | Status: DC

## 2022-08-10 MED ORDER — PHENYLEPHRINE HCL-NACL 20-0.9 MG/250ML-% IV SOLN
INTRAVENOUS | Status: DC | PRN
  Administered 2022-08-10: 50 ug/min via INTRAVENOUS

## 2022-08-10 MED ORDER — LACTATED RINGERS IV SOLN
INTRAVENOUS | Status: DC
  Administered 2022-08-10 (×2): 125 mL/h via INTRAVENOUS

## 2022-08-10 MED ORDER — IBUPROFEN 600 MG PO TABS
600.0000 mg | ORAL_TABLET | Freq: Four times a day (QID) | ORAL | Status: DC
  Administered 2022-08-11 – 2022-08-12 (×2): 600 mg via ORAL
  Filled 2022-08-10 (×2): qty 1

## 2022-08-10 MED ORDER — DIPHENHYDRAMINE HCL 25 MG PO CAPS: 25.0000 mg | ORAL_CAPSULE | ORAL | Status: DC | PRN

## 2022-08-10 MED ORDER — ACETAMINOPHEN 500 MG PO TABS: 1000.0000 mg | ORAL_TABLET | Freq: Once | ORAL | Status: DC

## 2022-08-10 MED ORDER — ACETAMINOPHEN 160 MG/5ML PO SOLN: 1000.0000 mg | Freq: Once | ORAL | Status: DC

## 2022-08-10 MED ORDER — LACTATED RINGERS IV SOLN: INTRAVENOUS | Status: DC

## 2022-08-10 MED ORDER — KETOROLAC TROMETHAMINE 30 MG/ML IJ SOLN: 30.0000 mg | Freq: Four times a day (QID) | INTRAMUSCULAR | Status: DC | PRN

## 2022-08-10 MED ORDER — ENOXAPARIN SODIUM 40 MG/0.4ML IJ SOSY
40.0000 mg | PREFILLED_SYRINGE | INTRAMUSCULAR | Status: DC
  Administered 2022-08-12: 40 mg via SUBCUTANEOUS
  Filled 2022-08-10 (×2): qty 0.4

## 2022-08-10 MED ORDER — HYDROMORPHONE HCL 1 MG/ML IJ SOLN: 1.0000 mg | INTRAMUSCULAR | Status: DC | PRN

## 2022-08-10 MED ORDER — DROPERIDOL 2.5 MG/ML IJ SOLN: 0.6250 mg | Freq: Once | INTRAMUSCULAR | Status: DC | PRN

## 2022-08-10 MED ORDER — ONDANSETRON HCL 4 MG/2ML IJ SOLN
INTRAMUSCULAR | Status: AC
  Filled 2022-08-10: qty 2

## 2022-08-10 MED ORDER — SOD CITRATE-CITRIC ACID 500-334 MG/5ML PO SOLN
ORAL | Status: AC
  Filled 2022-08-10: qty 30

## 2022-08-10 MED ORDER — OXYCODONE-ACETAMINOPHEN 5-325 MG PO TABS: 2.0000 | ORAL_TABLET | ORAL | Status: DC | PRN

## 2022-08-10 MED ORDER — TRANEXAMIC ACID-NACL 1000-0.7 MG/100ML-% IV SOLN
INTRAVENOUS | Status: DC | PRN
  Administered 2022-08-10: 1000 mg via INTRAVENOUS

## 2022-08-10 MED ORDER — MORPHINE SULFATE (PF) 0.5 MG/ML IJ SOLN
INTRAMUSCULAR | Status: AC
  Filled 2022-08-10: qty 10

## 2022-08-10 MED ORDER — CEFAZOLIN SODIUM-DEXTROSE 2-4 GM/100ML-% IV SOLN
INTRAVENOUS | Status: AC
  Filled 2022-08-10: qty 100

## 2022-08-10 MED ORDER — SODIUM CHLORIDE 0.9 % IR SOLN
Status: DC | PRN
  Administered 2022-08-10: 1

## 2022-08-10 MED ORDER — ACETAMINOPHEN 500 MG PO TABS: 1000.0000 mg | ORAL_TABLET | Freq: Four times a day (QID) | ORAL | Status: DC

## 2022-08-10 MED ORDER — DIPHENHYDRAMINE HCL 25 MG PO CAPS: 25.0000 mg | ORAL_CAPSULE | Freq: Four times a day (QID) | ORAL | Status: DC | PRN

## 2022-08-10 MED ORDER — KETOROLAC TROMETHAMINE 30 MG/ML IJ SOLN
30.0000 mg | Freq: Once | INTRAMUSCULAR | Status: AC
  Administered 2022-08-10: 30 mg via INTRAVENOUS

## 2022-08-10 MED ORDER — MAGNESIUM HYDROXIDE 400 MG/5ML PO SUSP: 30.0000 mL | ORAL | Status: DC | PRN

## 2022-08-10 MED ORDER — COCONUT OIL OIL: 1.0000 | TOPICAL_OIL | Status: DC | PRN

## 2022-08-10 MED ORDER — ZOLPIDEM TARTRATE 5 MG PO TABS: 5.0000 mg | ORAL_TABLET | Freq: Every evening | ORAL | Status: DC | PRN

## 2022-08-10 MED ORDER — SIMETHICONE 80 MG PO CHEW: 80.0000 mg | CHEWABLE_TABLET | ORAL | Status: DC | PRN

## 2022-08-10 MED ORDER — FENTANYL CITRATE (PF) 100 MCG/2ML IJ SOLN: 25.0000 ug | INTRAMUSCULAR | Status: DC | PRN

## 2022-08-10 MED ORDER — DEXAMETHASONE SODIUM PHOSPHATE 4 MG/ML IJ SOLN
INTRAMUSCULAR | Status: AC
  Filled 2022-08-10: qty 1

## 2022-08-10 MED ORDER — SODIUM CHLORIDE 0.9% FLUSH: 3.0000 mL | INTRAVENOUS | Status: DC | PRN

## 2022-08-10 MED ORDER — GABAPENTIN 100 MG PO CAPS
300.0000 mg | ORAL_CAPSULE | Freq: Two times a day (BID) | ORAL | Status: DC
  Administered 2022-08-10 – 2022-08-12 (×4): 300 mg via ORAL
  Filled 2022-08-10 (×4): qty 3

## 2022-08-10 MED ORDER — ACETAMINOPHEN 500 MG PO TABS
1000.0000 mg | ORAL_TABLET | Freq: Four times a day (QID) | ORAL | Status: DC
  Administered 2022-08-10 – 2022-08-12 (×6): 1000 mg via ORAL
  Filled 2022-08-10 (×6): qty 2

## 2022-08-10 MED ORDER — FERROUS SULFATE 325 (65 FE) MG PO TABS
325.0000 mg | ORAL_TABLET | ORAL | Status: DC
  Administered 2022-08-11: 325 mg via ORAL
  Filled 2022-08-10: qty 1

## 2022-08-10 MED ORDER — OXYCODONE HCL 5 MG PO TABS: 5.0000 mg | ORAL_TABLET | ORAL | Status: DC | PRN

## 2022-08-10 MED ORDER — PHENYLEPHRINE 80 MCG/ML (10ML) SYRINGE FOR IV PUSH (FOR BLOOD PRESSURE SUPPORT)
PREFILLED_SYRINGE | INTRAVENOUS | Status: AC
  Filled 2022-08-10: qty 10

## 2022-08-10 MED ORDER — SENNOSIDES-DOCUSATE SODIUM 8.6-50 MG PO TABS
2.0000 | ORAL_TABLET | Freq: Every day | ORAL | Status: DC
  Administered 2022-08-11 – 2022-08-12 (×2): 2 via ORAL
  Filled 2022-08-10 (×2): qty 2

## 2022-08-10 MED ORDER — KETOROLAC TROMETHAMINE 30 MG/ML IJ SOLN
30.0000 mg | Freq: Four times a day (QID) | INTRAMUSCULAR | Status: AC
  Administered 2022-08-10 – 2022-08-11 (×4): 30 mg via INTRAVENOUS
  Filled 2022-08-10 (×4): qty 1

## 2022-08-10 MED ORDER — PRENATAL MULTIVITAMIN CH
1.0000 | ORAL_TABLET | Freq: Every day | ORAL | Status: DC
  Administered 2022-08-11: 1 via ORAL
  Filled 2022-08-10: qty 1

## 2022-08-10 MED ORDER — ONDANSETRON HCL 4 MG/2ML IJ SOLN: 4.0000 mg | Freq: Three times a day (TID) | INTRAMUSCULAR | Status: DC | PRN

## 2022-08-10 MED ORDER — ONDANSETRON HCL 4 MG/2ML IJ SOLN
INTRAMUSCULAR | Status: DC | PRN
  Administered 2022-08-10: 4 mg via INTRAVENOUS

## 2022-08-10 MED ORDER — KETOROLAC TROMETHAMINE 30 MG/ML IJ SOLN
INTRAMUSCULAR | Status: AC
  Filled 2022-08-10: qty 1

## 2022-08-10 MED ORDER — BUPIVACAINE IN DEXTROSE 0.75-8.25 % IT SOLN
INTRATHECAL | Status: DC | PRN
  Administered 2022-08-10: 1.6 mL via INTRATHECAL

## 2022-08-10 MED ORDER — WITCH HAZEL-GLYCERIN EX PADS: 1.0000 | MEDICATED_PAD | CUTANEOUS | Status: DC | PRN

## 2022-08-10 MED ORDER — CEFAZOLIN SODIUM-DEXTROSE 2-4 GM/100ML-% IV SOLN
2.0000 g | INTRAVENOUS | Status: AC
  Administered 2022-08-10: 2 g via INTRAVENOUS

## 2022-08-10 MED ORDER — OXYTOCIN-SODIUM CHLORIDE 30-0.9 UT/500ML-% IV SOLN: 2.5000 [IU]/h | INTRAVENOUS | Status: AC

## 2022-08-10 MED ORDER — NALOXONE HCL 0.4 MG/ML IJ SOLN: 0.4000 mg | INTRAMUSCULAR | Status: DC | PRN

## 2022-08-10 MED ORDER — TETANUS-DIPHTH-ACELL PERTUSSIS 5-2.5-18.5 LF-MCG/0.5 IM SUSY: 0.5000 mL | PREFILLED_SYRINGE | Freq: Once | INTRAMUSCULAR | Status: DC

## 2022-08-10 MED ORDER — MORPHINE SULFATE (PF) 0.5 MG/ML IJ SOLN
INTRAMUSCULAR | Status: DC | PRN
  Administered 2022-08-10: 150 ug via INTRATHECAL

## 2022-08-10 MED ORDER — DIBUCAINE (PERIANAL) 1 % EX OINT: 1.0000 | TOPICAL_OINTMENT | CUTANEOUS | Status: DC | PRN

## 2022-08-10 MED ORDER — NALOXONE HCL 4 MG/10ML IJ SOLN: 1.0000 ug/kg/h | INTRAVENOUS | Status: DC | PRN

## 2022-08-10 MED ORDER — FENTANYL CITRATE (PF) 100 MCG/2ML IJ SOLN
INTRAMUSCULAR | Status: AC
  Filled 2022-08-10: qty 2

## 2022-08-10 MED ORDER — TRANEXAMIC ACID-NACL 1000-0.7 MG/100ML-% IV SOLN
INTRAVENOUS | Status: AC
  Filled 2022-08-10: qty 100

## 2022-08-10 MED ORDER — DEXAMETHASONE SODIUM PHOSPHATE 10 MG/ML IJ SOLN
INTRAMUSCULAR | Status: AC
  Filled 2022-08-10: qty 1

## 2022-08-10 MED ORDER — MENTHOL 3 MG MT LOZG: 1.0000 | LOZENGE | OROMUCOSAL | Status: DC | PRN

## 2022-08-10 MED ORDER — OXYTOCIN-SODIUM CHLORIDE 30-0.9 UT/500ML-% IV SOLN
INTRAVENOUS | Status: DC | PRN
  Administered 2022-08-10: 350 mL via INTRAVENOUS

## 2022-08-10 MED ORDER — OXYTOCIN-SODIUM CHLORIDE 30-0.9 UT/500ML-% IV SOLN
INTRAVENOUS | Status: AC
  Filled 2022-08-10: qty 500

## 2022-08-10 MED ORDER — STERILE WATER FOR IRRIGATION IR SOLN
Status: DC | PRN
  Administered 2022-08-10: 1000 mL

## 2022-08-10 MED ORDER — DIPHENHYDRAMINE HCL 50 MG/ML IJ SOLN
12.5000 mg | INTRAMUSCULAR | Status: DC | PRN
  Administered 2022-08-10: 12.5 mg via INTRAVENOUS
  Filled 2022-08-10: qty 1

## 2022-08-10 MED ORDER — POVIDONE-IODINE 10 % EX SWAB
2.0000 | Freq: Once | CUTANEOUS | Status: AC
  Administered 2022-08-10: 2 via TOPICAL

## 2022-08-10 SURGICAL SUPPLY — 31 items
CHLORAPREP W/TINT 26ML (MISCELLANEOUS) ×2 IMPLANT
CLAMP UMBILICAL CORD (MISCELLANEOUS) ×1 IMPLANT
CLIP FILSHIE TUBAL LIGA STRL (Clip) IMPLANT
CLOTH BEACON ORANGE TIMEOUT ST (SAFETY) ×1 IMPLANT
DRSG OPSITE POSTOP 4X10 (GAUZE/BANDAGES/DRESSINGS) ×1 IMPLANT
ELECT REM PT RETURN 9FT ADLT (ELECTROSURGICAL) ×1
ELECTRODE REM PT RTRN 9FT ADLT (ELECTROSURGICAL) ×1 IMPLANT
EXTRACTOR VACUUM M CUP 4 TUBE (SUCTIONS) IMPLANT
GAUZE SPONGE 4X4 12PLY STRL (GAUZE/BANDAGES/DRESSINGS) IMPLANT
GLOVE BIOGEL PI IND STRL 7.0 (GLOVE) ×3 IMPLANT
GLOVE ECLIPSE 7.0 STRL STRAW (GLOVE) ×1 IMPLANT
GOWN STRL REUS W/TWL LRG LVL3 (GOWN DISPOSABLE) ×2 IMPLANT
KIT ABG SYR 3ML LUER SLIP (SYRINGE) IMPLANT
LIGASURE IMPACT 36 18CM CVD LR (INSTRUMENTS) IMPLANT
NDL HYPO 25X5/8 SAFETYGLIDE (NEEDLE) ×1 IMPLANT
NEEDLE HYPO 22GX1.5 SAFETY (NEEDLE) ×1 IMPLANT
NEEDLE HYPO 25X5/8 SAFETYGLIDE (NEEDLE) ×1 IMPLANT
NS IRRIG 1000ML POUR BTL (IV SOLUTION) ×1 IMPLANT
PACK C SECTION WH (CUSTOM PROCEDURE TRAY) ×1 IMPLANT
PAD ABD 7.5X8 STRL (GAUZE/BANDAGES/DRESSINGS) ×1 IMPLANT
PAD OB MATERNITY 4.3X12.25 (PERSONAL CARE ITEMS) ×1 IMPLANT
RTRCTR C-SECT PINK 25CM LRG (MISCELLANEOUS) IMPLANT
SUT PDS AB 0 CTX 36 PDP370T (SUTURE) IMPLANT
SUT PLAIN 2 0 XLH (SUTURE) IMPLANT
SUT VIC AB 0 CTX 36 (SUTURE) ×2
SUT VIC AB 0 CTX36XBRD ANBCTRL (SUTURE) ×2 IMPLANT
SUT VIC AB 4-0 KS 27 (SUTURE) ×1 IMPLANT
SYR CONTROL 10ML LL (SYRINGE) ×1 IMPLANT
TOWEL OR 17X24 6PK STRL BLUE (TOWEL DISPOSABLE) ×1 IMPLANT
TRAY FOLEY W/BAG SLVR 14FR LF (SET/KITS/TRAYS/PACK) ×1 IMPLANT
WATER STERILE IRR 1000ML POUR (IV SOLUTION) ×1 IMPLANT

## 2022-08-10 NOTE — Anesthesia Procedure Notes (Signed)
Spinal  Patient location during procedure: OR Start time: 08/10/2022 9:44 AM End time: 08/10/2022 9:47 AM Reason for block: surgical anesthesia Staffing Performed: anesthesiologist  Anesthesiologist: Brennan Bailey, MD Performed by: Brennan Bailey, MD Authorized by: Brennan Bailey, MD   Preanesthetic Checklist Completed: patient identified, IV checked, risks and benefits discussed, monitors and equipment checked, pre-op evaluation and timeout performed Spinal Block Patient position: sitting Prep: DuraPrep and site prepped and draped Patient monitoring: heart rate, continuous pulse ox and blood pressure Approach: midline Location: L3-4 Injection technique: single-shot Needle Needle type: Pencan  Needle gauge: 24 G Needle length: 10 cm Assessment Sensory level: T4 Events: CSF return Additional Notes Risks, benefits, and alternative discussed. Patient gave consent to procedure. Prepped and draped in sitting position. Clear CSF obtained after one needle redirection. Positive terminal aspiration. No pain or paraesthesias with injection. Patient tolerated procedure well. Vital signs stable. Tawny Asal, MD

## 2022-08-10 NOTE — Discharge Summary (Signed)
Postpartum Discharge Summary  Date of Service updated***     Patient Name: Sally Wiley DOB: 1986-03-30 MRN: [id number]  Date of admission: 08/10/2022 Delivery date:08/10/2022  Delivering provider: Verita Schneiders A  Date of discharge: 08/10/2022  Admitting diagnosis: Previa Undesired Fertility Intrauterine pregnancy: [redacted]w[redacted]d    Secondary diagnosis:  Principal Problem:   IUGR (intrauterine growth restriction) affecting care of mother, third trimester Active Problems:   History of cervical incompetence in pregnancy, currently pregnant   Supervision of high risk pregnancy, antepartum   Placenta previa, delivered   Gestational diabetes mellitus (GDM) in third trimester   Request for sterilization  Additional problems: ***    Discharge diagnosis: Term Pregnancy Delivered and GDM A1                                              Post partum procedures:{Postpartum procedures:23558} Augmentation: N/A Complications: None  Hospital course: Sceduled C/S   36y.o. yo G3P1202 at 364w0das admitted to the hospital 08/10/2022 for scheduled cesarean section with the following indication:Previa.Delivery details are as follows:  Membrane Rupture Time/Date: 10:13 AM ,08/10/2022   Delivery Method:C-Section, Low Transverse  Details of operation can be found in separate operative note.  Patient had a postpartum course complicated by***.  She is ambulating, tolerating a regular diet, passing flatus, and urinating well. Patient is discharged home in stable condition on  08/10/22        Newborn Data: Birth date:08/10/2022  Birth time:10:14 AM  Gender:Female  Living status:Living  Apgars:8 ,9  Weight:2740 g     Magnesium Sulfate received: No BMZ received: No Rhophylac:No MMR:No T-DaP:{Tdap:23962} Flu: {F{PJA:25053}ransfusion:{Transfusion received:30440034}  Physical exam  Vitals:   08/10/22 0740  BP: 113/77  Pulse: 85  Resp: 18  Temp: 97.9 F (36.6 C)  TempSrc: Oral   Weight: 67.8 kg  Height: '5\' 3"'  (1.6 m)   General: {Exam; general:21111117} Lochia: {Desc; appropriate/inappropriate:30686::"appropriate"} Uterine Fundus: {Desc; firm/soft:30687} Incision: {Exam; incision:21111123} DVT Evaluation: {Exam; dvZJQ:7341937}abs: Lab Results  Component Value Date   WBC 10.2 08/08/2022   HGB 10.8 (L) 08/08/2022   HCT 32.3 (L) 08/08/2022   MCV 89.2 08/08/2022   PLT 272 08/08/2022      Latest Ref Rng & Units 07/11/2022   10:36 AM  CMP  Glucose 70 - 99 mg/dL 83   BUN 6 - 20 mg/dL 5   Creatinine 0.57 - 1.00 mg/dL 0.49   Sodium 134 - 144 mmol/L 137   Potassium 3.5 - 5.2 mmol/L 3.7   Chloride 96 - 106 mmol/L 104   CO2 20 - 29 mmol/L 16   Calcium 8.7 - 10.2 mg/dL 8.7   Total Protein 6.0 - 8.5 g/dL 6.3   Total Bilirubin 0.0 - 1.2 mg/dL <0.2   Alkaline Phos 44 - 121 IU/L 130   AST 0 - 40 IU/L 15   ALT 0 - 32 IU/L 9    Edinburgh Score:    05/25/2020    1:20 PM  Edinburgh Postnatal Depression Scale Screening Tool  I have been able to laugh and see the funny side of things. 0  I have looked forward with enjoyment to things. 0  I have blamed myself unnecessarily when things went wrong. 1  I have been anxious or worried for no good reason. 0  I have felt scared or panicky for  no good reason. 0  Things have been getting on top of me. 0  I have been so unhappy that I have had difficulty sleeping. 0  I have felt sad or miserable. 0  I have been so unhappy that I have been crying. 0  The thought of harming myself has occurred to me. 0  Edinburgh Postnatal Depression Scale Total 1     After visit meds:  Allergies as of 08/10/2022       Reactions   Shellfish Allergy Hives   Crawfish     Med Rec must be completed prior to using this [facility]***        Discharge home in stable condition Infant Feeding: {Baby feeding:23562} Infant Disposition:{CHL IP OB HOME WITH OLMBEM:75449} Discharge instruction: per After Visit Summary and Postpartum  booklet. Activity: Advance as tolerated. Pelvic rest for 6 weeks.  Diet: {OB EEFE:07121975} Future Appointments: Future Appointments  Date Time Provider [place]  08/22/2022 10:35 AM Truett Mainland, DO [facility] None  10/02/2022  9:35 AM Nehemiah Settle Tanna Savoy, DO [facility] None   Follow up Visit:  Message sent to [facility] by Autry-Lott on 08/10/2022  Please schedule this patient for a In person postpartum visit in 6 weeks with the following provider: MD and APP. Additional Postpartum F/U:2 hour GTT and Incision check 1 week  High risk pregnancy complicated by: GDM and cervical incompetence  Delivery mode:  C-Section, Low Transverse  Anticipated Birth Control:  BTL done [facility]   08/10/2022 Jamesetta Greenhalgh Autry-Lott, DO

## 2022-08-10 NOTE — Lactation Note (Signed)
This note was copied from a baby's chart. Lactation Consultation Note  Patient Name: Sally Wiley [id number] Date: 08/10/2022 Reason for consult: Initial assessment;Early term 37-38.6wks Age:34 hours  LC in to visit with P3 (1 fetal demise at 17 wks) Mom of ET infant delivered by C/S.   Baby latched in cradle laid back position when [place] entered the room.  Baby with a deep latch and sucking with deep jaw extensions and swallows.  When baby came off, nipple not pinched.  Assisted to re-latch baby, easy latch and baby aggressively sucking.    Reviewed breast massage and hand expression, colostrum easily expressed.  Mom had a nipple piercing and milk noted from side of nipple as well as tip of nipple.    Mom's goal is to exclusively breastfeed, if she can.  [place] set up DEBP and sized her to 21 mm flanges.  Reviewed how to use initiation setting.  Plan recommended- 1- Keep baby STS as much as possible 2- Offer the breast at least every 3 hrs, or sooner if baby is cueing 3- ask for help with latching prn. 4- Pump both breasts after breastfeeding and feed back any EBM to baby.  This LC recommended holding off on formula supplementation while baby is so aggressively breastfeeding.  Talked about probable sleepiness in the 2nd day and the need to increase supplementation of EBM+/formula.  Baby should feed at least 8 times per 24 hrs.   Maternal Data Has patient been taught Hand Expression?: Yes Does the patient have breastfeeding experience prior to this delivery?: Yes How long did the patient breastfeed?: 6 months  Feeding Mother's Current Feeding Choice: Breast Milk and Formula  LATCH Score Latch: Grasps breast easily, tongue down, lips flanged, rhythmical sucking.  Audible Swallowing: Spontaneous and intermittent  Type of Nipple: Everted at rest and after stimulation  Comfort (Breast/Nipple): Soft / non-tender  Hold (Positioning): Assistance needed to correctly position infant at  breast and maintain latch.  LATCH Score: 9   Lactation Tools Discussed/Used Tools: Pump;Flanges Flange Size: 21 Breast pump type: Double-Electric Breast Pump;Manual Pump Education: Setup, frequency, and cleaning;Milk Storage Reason for Pumping: Support milk supply/ 37 wks/ 6 lbs 0.7 oz at birth Pumping frequency: Encouraged pumping after breastfeeding  Interventions Interventions: Breast feeding basics reviewed;Assisted with latch;Skin to skin;Breast massage;Hand express;Breast compression;Adjust position;Support pillows;Position options;DEBP;LC Services brochure  Discharge Pump: DEBP;Personal (Spectra 2)  Consult Status Consult Status: Follow-up Date: 08/11/22 Follow-up type: [place] 08/10/2022, 3:49 PM

## 2022-08-10 NOTE — Op Note (Signed)
Sally Wiley PROCEDURE DATE: 08/10/2022  PREOPERATIVE DIAGNOSES: Intrauterine pregnancy at [redacted]w[redacted]d weeks gestation; posterior placenta previa; intrauterine growth restriction; diet-controlled gestational diabetes; cerclage in place for history of cervical incompetence; undesired fertility  POSTOPERATIVE DIAGNOSES: The same  PROCEDURE: Low Transverse Cesarean Section, Bilateral Salpingectomy, Cervical Cerclage Removal  SURGEON:  Dr. Verita Schneiders  ASSISTANT:  Dr. Naaman Plummer Aut[place]Lott. An experienced assistant was required given the standard of surgical care given the complexity of the case.  This assistant was needed for exposure, dissection, suctioning, retraction, instrument exchange, assisting with delivery with administration of fundal pressure, and for overall help during the procedure.  ANESTHESIOLOGY TEAM: Anesthesiologist: Brennan Bailey, MD CRNA: Adalberto Ill, CRNA  INDICATIONS: Sally Wiley is a 36 y.o. [phone] at [redacted]w[redacted]d here for cesarean section, bilateral tubal sterilization secondary, cerclage removal to the indications listed under preoperative diagnoses; please see preoperative note for further details.  The risks of surgery were discussed with the patient including but were not limited to: bleeding which may require transfusion or reoperation; infection which may require antibiotics; injury to bowel, bladder, ureters or other surrounding organs; injury to the fetus; need for additional procedures including hysterectomy in the event of a life-threatening hemorrhage; formation of adhesions; placental abnormalities wth subsequent pregnancies; incisional problems; thromboembolic phenomenon and other postoperative/anesthesia complications.  Patient also desires permanent sterilization.  Other reversible forms of contraception were discussed with patient; she declines all other modalities.   Risks of sterilization procedure discussed with patient including but not limited to: risk  of regret, permanence of method, bleeding, infection, injury to surrounding organs and need for additional procedures.  Failure risk of about 1% with increased risk of ectopic gestation if pregnancy occurs was also discussed with patient.  Also discussed possibility of post-tubal syndrome with increased pelvic pain or menstrual irregularities. For the cerclage removal, discussed additional possible risk of leaving a piece of the suture embedded in the cervix which may not be successfully removed. The patient concurred with the proposed plan, giving informed written consent for the procedures.  FINDINGS:  Viable female infant in cephalic presentation.  Apgars 8 and 9.  Weight 6 lb 0.7 oz (2740 g). Clear amniotic fluid.  Loose nuchal cord x 2.  Intact placenta, three vessel cord.  Normal uterus, fallopian tubes and ovaries bilaterally. Fallopian tubes were removed bilaterally.  Cervical cerclage was successfully removed prior to cesarean section.  ANESTHESIA: Spinal INTRAVENOUS FLUIDS: 1400 ml ESTIMATED BLOOD LOSS: 869 ml URINE OUTPUT:  100 ml SPECIMENS: Placenta sent to L&D and bilateral fallopian tubes  sent to pathology COMPLICATIONS: None immediate  PROCEDURE IN DETAIL:  The patient preoperatively received intravenous antibiotics and had sequential compression devices applied to her lower extremities.   She was then taken to the operating room where spinal anesthesia was administered and was found to be adequate. She was then placed in a dorsal lithotomy position. A vaginal speculum was placed to visualize her cervix. The anterior knot of Prolene cervical cerclage was recognized and pulled upwards to visualize both sides of the circumferential suture under the knot. One side was cut and the remaining suture was pulled and removed intact.  There was scant bleeding noted after removal.    Patient was then placed in a dorsal supine position with a leftward tilt, and prepped and draped in a sterile  manner.  A foley catheter was placed into her bladder and attached to constant gravity.  After an adequate timeout was performed, a Pfannenstiel skin incision was made  with scalpel  and carried through to the underlying layer of fascia. The fascia was incised in the midline, and this incision was extended bilaterally in a blunt fashion.  The underlying rectus muscles were dissected off the fascia superiorly and inferiorly in a blunt fashion. The rectus muscles were separated in the midline and the peritoneum was entered bluntly. The Alexis self-retaining retractor was introduced into the abdominal cavity.  Attention was turned to the lower uterine segment where a low transverse hysterotomy was made with a scalpel and extended bilaterally bluntly.  The infant was successfully delivered, the cord was clamped and cut after one minute, and the infant was handed over to the awaiting neonatology team. Uterine massage was then administered, and the placenta delivered intact with a three-vessel cord. The uterus was then cleared of clots and debris.  The hysterotomy was closed with 0 Vicryl in a running locked fashion, and figure-of-eight 0 Vicryl serosal stitches were placed to help with hemostasis.     Attention was then turned to the left fallopian tube, and Babcock clamps were used to grasp the tube. The Ligasure device was used to excise the entire left fallopian tube from the underlying mesosalpinx and the uterus.  The right fallopian tube was then identified, grasped with Babcock clamps and excised in a similar fashion using the Ligasure device allowing for bilateral salpingectomy. Good hemostasis was noted overall. The pelvis was cleared of all clot and debris. Hemostasis was confirmed on all surfaces.  The retractor was removed.  The peritoneum was closed with a 0 Vicryl running stitch. The fascia was then closed using 0 Vicryl in a running fashion.  The subcutaneous layer was irrigated, and the skin was closed  with a 4-0 Vicryl subcuticular stitch. The patient tolerated the procedure well. Sponge, instrument and needle counts were correct x 3.  She was taken to the recovery room in stable condition.    Verita Schneiders, MD, [place] [facility] [employer] [facility], [place]

## 2022-08-10 NOTE — Transfer of Care (Signed)
Immediate Anesthesia Transfer of Care Note  Patient: Sally Wiley  Procedure(s) Performed: CESAREAN SECTION WITH BILATERAL TUBAL LIGATION (Bilateral)  Patient Location: PACU  Anesthesia Type:Spinal  Level of Consciousness: awake, alert  and oriented  Airway & Oxygen Therapy: Patient Spontanous Breathing  Post-op Assessment: Report given to RN and Post -op Vital signs reviewed and stable  Post vital signs: Reviewed and stable  Last Vitals:  Vitals Value Taken Time  BP 113/66 08/10/22 1108  Temp 36.4 C 08/10/22 1108  Pulse 82 08/10/22 1111  Resp 19 08/10/22 1111  SpO2 100 % 08/10/22 1111  Vitals shown include unvalidated device data.  Last Pain:  Vitals:   08/10/22 1108  TempSrc:   PainSc: 5       Patients Stated Pain Goal: 1 (60/10/93 2355)  Complications: No notable events documented.

## 2022-08-10 NOTE — Anesthesia Postprocedure Evaluation (Signed)
Anesthesia Post Note  Patient: Sally Wiley  Procedure(s) Performed: CESAREAN SECTION WITH BILATERAL TUBAL LIGATION (Bilateral)     Patient location during evaluation: PACU Anesthesia Type: Spinal Level of consciousness: awake and alert Pain management: pain level controlled Vital Signs Assessment: post-procedure vital signs reviewed and stable Respiratory status: spontaneous breathing, nonlabored ventilation and respiratory function stable Cardiovascular status: blood pressure returned to baseline Postop Assessment: no apparent nausea or vomiting, spinal receding, no headache and no backache Anesthetic complications: no   No notable events documented.  Last Vitals:  Vitals:   08/10/22 1237 08/10/22 1412  BP: 108/62 98/64  Pulse: 85 74  Resp: 16 16  Temp: 36.6 C 36.5 C  SpO2:  97%    Last Pain:  Vitals:   08/10/22 1412  TempSrc: Oral  PainSc: 3    Pain Goal: Patients Stated Pain Goal: 0 (08/10/22 1412)                 Marthenia Rolling

## 2022-08-11 DIAGNOSIS — D62 Acute posthemorrhagic anemia: Secondary | ICD-10-CM | POA: Diagnosis not present

## 2022-08-11 LAB — CBC
HCT: 25.1 % — ABNORMAL LOW (ref 36.0–46.0)
Hemoglobin: 8.6 g/dL — ABNORMAL LOW (ref 12.0–15.0)
MCH: 30.2 pg (ref 26.0–34.0)
MCHC: 34.3 g/dL (ref 30.0–36.0)
MCV: 88.1 fL (ref 80.0–100.0)
Platelets: 221 10*3/uL (ref 150–400)
RBC: 2.85 MIL/uL — ABNORMAL LOW (ref 3.87–5.11)
RDW: 13.3 % (ref 11.5–15.5)
WBC: 10.9 10*3/uL — ABNORMAL HIGH (ref 4.0–10.5)
nRBC: 0 % (ref 0.0–0.2)

## 2022-08-11 MED ORDER — SODIUM CHLORIDE 0.9 % IV SOLN: INTRAVENOUS | Status: DC | PRN

## 2022-08-11 MED ORDER — SODIUM CHLORIDE 0.9 % IV SOLN
500.0000 mg | Freq: Once | INTRAVENOUS | Status: AC
  Administered 2022-08-11: 500 mg via INTRAVENOUS
  Filled 2022-08-11: qty 500

## 2022-08-11 NOTE — Progress Notes (Signed)
Postpartum Day 1: Cesarean Delivery  Subjective: Patient reports incisional pain, tolerating PO, and no problems voiding.   Baby is stable at bedside, breastfeeding. Moderate lochia.  Objective: Vital signs in last 24 hours: Temp:  [96.7 F (35.9 C)-99 F (37.2 C)] 99 F (37.2 C) (10/15 0341) Pulse Rate:  [67-104] 104 (10/15 0341) Resp:  [16-21] 17 (10/15 0341) BP: (94-113)/(55-77) 109/66 (10/15 0341) SpO2:  [97 %-100 %] 98 % (10/14 1550) Weight:  [67.8 kg] 67.8 kg (10/14 0740)  Physical Exam:  General: alert and no distress Lochia: appropriate Uterine Fundus: firm Incision: no significant drainage, dressing in place DVT Evaluation: No evidence of DVT seen on physical exam. Negative Homan's sign. No cords or calf tenderness. No significant calf/ankle edema.  Recent Labs    08/08/22 1133 08/11/22 0531  HGB 10.8* 8.6*  HCT 32.3* 25.1*    Assessment/Plan: Status post Cesarean section. Doing well postoperatively.  Postoperative anemia dure to acute blood loss to be treated with Venofer, patient consented for this. Analgesia as needs, OOB recommended Lovenox for VTE prophylaxis. Continue current care.  Verita Schneiders, MD 08/11/2022, 7:35 AM

## 2022-08-12 ENCOUNTER — Encounter: Payer: Self-pay | Admitting: Family Medicine

## 2022-08-12 LAB — GLUCOSE, CAPILLARY
Glucose-Capillary: 69 mg/dL — ABNORMAL LOW (ref 70–99)
Glucose-Capillary: 76 mg/dL (ref 70–99)

## 2022-08-12 LAB — CULTURE, BETA STREP (GROUP B ONLY): Strep Gp B Culture: NEGATIVE

## 2022-08-12 MED ORDER — IBUPROFEN 600 MG PO TABS: 600.0000 mg | ORAL_TABLET | Freq: Four times a day (QID) | ORAL | 0 refills | Status: AC

## 2022-08-12 MED ORDER — OXYCODONE HCL 5 MG PO TABS: 5.0000 mg | ORAL_TABLET | ORAL | 0 refills | Status: AC | PRN

## 2022-08-12 MED ORDER — FERROUS SULFATE 325 (65 FE) MG PO TABS: 325.0000 mg | ORAL_TABLET | ORAL | 0 refills | Status: AC

## 2022-08-12 MED ORDER — ACETAMINOPHEN 500 MG PO TABS: 1000.0000 mg | ORAL_TABLET | Freq: Four times a day (QID) | ORAL | 0 refills | Status: AC

## 2022-08-12 NOTE — Progress Notes (Signed)
Fasting blood sugar on night shift at 0636 was 69. Upon shift report, previous RN had given patient orange juice; however, patient had fallen back to sleep and did not drink juice. Woke patient during shift change report and encouraged patient to drink juice. Repeat blood sugar was 76. Maxwell Caul, Leretha Dykes [place]

## 2022-08-12 NOTE — Lactation Note (Signed)
This note was copied from a baby's chart. Lactation Consultation Note  Patient Name: Sally Wiley BULAG'T Date: 08/12/2022 Reason for consult: Follow-up assessment;Early term 37-38.6wks Age:34 hours  LC in to visit with P3 Mom of ET infant on day of discharge.  Baby is at a 8% weight loss.  Baby is sleeping and in car seat ready to go home.  Reviewed with Mom about pumping after breastfeeding to support a full milk supply.  Baby has been supplemented with formula by bottle.  Mom reports baby latches well and denies any questions.  Encouraged STS and offering the breast often with feeding cues.  Mom has a Spectra 2 DEBP at home.  Mom reports she has pumped twice while in the hospital.  Encouraged Mom to increase her pumping when baby is supplemented after breastfeeding.  Reviewed engorgement prevention and treatment.  Encouraged OP lactation F/U, Mom declined referral to [facility].  Reviewed handout and phone numbers for assistance.  Ped appt 10/17.   Lactation Tools Discussed/Used Tools: Pump;Bottle Breast pump type: Double-Electric Breast Pump  Interventions Interventions: Breast feeding basics reviewed;Skin to skin;Hand pump;DEBP;Education  Discharge Discharge Education: Engorgement and breast care;Warning signs for feeding baby;Outpatient recommendation  Consult Status Consult Status: Complete (mother declined follow up) Date: 08/12/22 Follow-up type: Call as needed    Broadus John 08/12/2022, 1:54 PM

## 2022-08-13 LAB — SURGICAL PATHOLOGY

## 2022-08-15 ENCOUNTER — Encounter: Payer: 59 | Admitting: Family Medicine

## 2022-08-22 ENCOUNTER — Encounter: Payer: Self-pay | Admitting: Family Medicine

## 2022-08-22 ENCOUNTER — Ambulatory Visit (INDEPENDENT_AMBULATORY_CARE_PROVIDER_SITE_OTHER): Payer: Medicaid Other | Admitting: Family Medicine

## 2022-08-22 VITALS — BP 114/75 | HR 82 | Wt 137.0 lb

## 2022-08-22 DIAGNOSIS — Z98891 History of uterine scar from previous surgery: Secondary | ICD-10-CM

## 2022-08-22 DIAGNOSIS — Z4889 Encounter for other specified surgical aftercare: Secondary | ICD-10-CM

## 2022-08-22 NOTE — Progress Notes (Signed)
   Subjective:    Patient ID: Sally Wiley, female    DOB: 08-Mar-1986, 36 y.o.   MRN: [id number]  HPI Patient seen for incision check.  She is status post cesarean delivery 10 days ago due to placenta previa and growth restriction.  Patient is not experiencing any problems.  Bleeding is minimal.   Review of Systems     Objective:   Physical Exam Vitals reviewed.  Constitutional:      Appearance: Normal appearance.  Abdominal:     General: Abdomen is flat.     Palpations: Abdomen is soft.     Comments: Pfannenstiel incision clean, dry, and intact.  It is well-healed without erythema or drainage.  Neurological:     Mental Status: She is alert.  Psychiatric:        Mood and Affect: Mood normal.        Behavior: Behavior normal.        Thought Content: Thought content normal.        Judgment: Judgment normal.       Assessment & Plan:   1. S/P cesarean section   2. Encounter for post surgical wound check   Incision is well-healed.  Follow-up in 3 to 4 weeks.

## 2022-08-22 NOTE — Progress Notes (Signed)
Patient is one week postpartum and presents for incision check. Kathrene Alu RN

## 2022-08-29 ENCOUNTER — Encounter: Payer: 59 | Admitting: Family Medicine

## 2022-10-02 ENCOUNTER — Ambulatory Visit (INDEPENDENT_AMBULATORY_CARE_PROVIDER_SITE_OTHER): Payer: Medicaid Other | Admitting: Family Medicine

## 2022-10-02 DIAGNOSIS — F53 Postpartum depression: Secondary | ICD-10-CM | POA: Diagnosis not present

## 2022-10-02 MED ORDER — ESCITALOPRAM OXALATE 10 MG PO TABS: 10.0000 mg | ORAL_TABLET | Freq: Every day | ORAL | 3 refills | Status: AC

## 2022-10-02 NOTE — Progress Notes (Signed)
Post Partum Visit Note  Sally Wiley is a 36 y.o. [phone] female who presents for a postpartum visit. She is 7 weeks postpartum following a repeat cesarean section.  I have fully reviewed the prenatal and intrapartum course. The delivery was at 37 gestational weeks.  Anesthesia: spinal. Postpartum course has been normal. Baby is doing well. Baby is feeding by both breast and bottle - Similac Total 360 . Bleeding no bleeding. Bowel function is normal. Bladder function is normal. Patient is sexually active. Contraception method is tubal ligation. Postpartum depression screening: negative.   The pregnancy intention screening data noted above was reviewed. Potential methods of contraception were discussed. The patient elected to proceed with No data recorded.   Edinburgh Postnatal Depression Scale - 10/02/22 0931       Edinburgh Postnatal Depression Scale:  In the Past 7 Days   I have been able to laugh and see the funny side of things. 0    I have looked forward with enjoyment to things. 0    I have blamed myself unnecessarily when things went wrong. 0    I have been anxious or worried for no good reason. 2    I have felt scared or panicky for no good reason. 2    Things have been getting on top of me. 0    I have been so unhappy that I have had difficulty sleeping. 0    I have felt sad or miserable. 0    I have been so unhappy that I have been crying. 0    The thought of harming myself has occurred to me. 0    Edinburgh Postnatal Depression Scale Total 4             Health Maintenance Due  Topic Date Due   INFLUENZA VACCINE  Never done   COVID-19 Vaccine (2 - 2023-24 season) 06/28/2022    The following portions of the patient's history were reviewed and updated as appropriate: allergies, current medications, past family history, past medical history, past social history, past surgical history, and problem list.  Review of Systems Pertinent items are noted in  HPI.  Objective:  BP 110/77   Pulse 63   Wt 138 lb (62.6 kg)   BMI 24.45 kg/m    General:  alert, cooperative, and no distress   Breasts:  not indicated  Lungs: clear to auscultation bilaterally  Heart:  regular rate and rhythm, S1, S2 normal, no murmur, click, rub or gallop  Abdomen: soft, non-tender; bowel sounds normal; no masses,  no organomegaly   Wound well approximated incision  GU exam:  not indicated       Assessment:   1. Postpartum care and examination  2. Postpartum depression Having increased anxiety and small panic attacks. Worries about things. Would like to start medication - multiple family members (mother, MGM) are on SSRIs. Will start lexapro.   Plan:   Essential components of care per ACOG recommendations:  1.  Mood and well being: Patient with negative depression screening today. Reviewed local resources for support.  - Patient tobacco use? No.   - hx of drug use? No.    2. Infant care and feeding:  -Patient currently breastmilk feeding? Yes. Reviewed importance of draining breast regularly to support lactation.  -Social determinants of health (SDOH) reviewed in EPIC. No concerns  3. Sexuality, contraception and birth spacing - Patient does not want a pregnancy in the next year.   - Reviewed reproductive  life planning. Reviewed contraceptive methods based on pt preferences and effectiveness.  Patient desired Female Sterilization today.   - Discussed birth spacing of 18 months  4. Sleep and fatigue -Encouraged family/partner/community support of 4 hrs of uninterrupted sleep to help with mood and fatigue  5. Physical Recovery  - Discussed patients delivery and complications. She describes her labor as good. - Patient had a C-section.  - Patient has urinary incontinence? No. - Patient is safe to resume physical and sexual activity  6.  Health Maintenance - HM due items addressed Yes - Last pap smear  Diagnosis  Date Value Ref Range Status   01/17/2022   Final   - Negative for intraepithelial lesion or malignancy (NILM)   Pap smear not done at today's visit.  -Breast Cancer screening indicated? No.   7. Chronic Disease/Pregnancy Condition follow up: None  - PCP follow up  Levie Heritage, DO [facility], [facility]

## 2022-11-06 ENCOUNTER — Ambulatory Visit: Payer: Medicaid Other | Admitting: Family Medicine

## 2024-10-17 DIAGNOSIS — Z87891 Personal history of nicotine dependence: Secondary | ICD-10-CM | POA: Diagnosis not present

## 2024-10-17 DIAGNOSIS — H9202 Otalgia, left ear: Secondary | ICD-10-CM | POA: Diagnosis not present

## 2024-10-17 DIAGNOSIS — R509 Fever, unspecified: Secondary | ICD-10-CM | POA: Diagnosis not present

## 2024-10-17 DIAGNOSIS — B349 Viral infection, unspecified: Secondary | ICD-10-CM | POA: Diagnosis not present
